# Patient Record
Sex: Male | Born: 1945 | Race: White | Hispanic: No | Marital: Single | Smoking: Current every day smoker
Health system: Southern US, Community
[De-identification: ages and names within clinical notes are randomized; demographics above are authoritative.]

## PROBLEM LIST (undated history)

## (undated) DIAGNOSIS — D649 Anemia, unspecified: Secondary | ICD-10-CM

## (undated) DIAGNOSIS — K219 Gastro-esophageal reflux disease without esophagitis: Secondary | ICD-10-CM

## (undated) DIAGNOSIS — I4891 Unspecified atrial fibrillation: Secondary | ICD-10-CM

## (undated) DIAGNOSIS — I499 Cardiac arrhythmia, unspecified: Secondary | ICD-10-CM

## (undated) DIAGNOSIS — N210 Calculus in bladder: Secondary | ICD-10-CM

## (undated) DIAGNOSIS — M1990 Unspecified osteoarthritis, unspecified site: Secondary | ICD-10-CM

## (undated) DIAGNOSIS — I10 Essential (primary) hypertension: Secondary | ICD-10-CM

## (undated) HISTORY — PX: HIP SURGERY: SHX245

## (undated) HISTORY — PX: CATARACT EXTRACTION: SUR2

---

## 2010-12-07 HISTORY — PX: JOINT REPLACEMENT: SHX530

## 2012-06-07 ENCOUNTER — Ambulatory Visit: Payer: Self-pay | Admitting: Ophthalmology

## 2012-06-07 LAB — PROTIME-INR
INR: 1.9
Prothrombin Time: 22 secs — ABNORMAL HIGH (ref 11.5–14.7)

## 2012-06-07 LAB — POTASSIUM: Potassium: 3.6 mmol/L (ref 3.5–5.1)

## 2012-06-21 ENCOUNTER — Ambulatory Visit: Payer: Self-pay | Admitting: Ophthalmology

## 2012-06-21 LAB — PROTIME-INR
INR: 1.9
Prothrombin Time: 22 secs — ABNORMAL HIGH (ref 11.5–14.7)

## 2012-06-21 LAB — POTASSIUM: Potassium: 3.3 mmol/L — ABNORMAL LOW (ref 3.5–5.1)

## 2012-08-10 ENCOUNTER — Ambulatory Visit: Payer: Self-pay | Admitting: Ophthalmology

## 2012-08-10 LAB — PROTIME-INR
INR: 3.6
Prothrombin Time: 36.2 secs — ABNORMAL HIGH (ref 11.5–14.7)

## 2012-08-10 LAB — POTASSIUM: Potassium: 3.4 mmol/L — ABNORMAL LOW (ref 3.5–5.1)

## 2012-08-23 ENCOUNTER — Ambulatory Visit: Payer: Self-pay | Admitting: Ophthalmology

## 2013-12-11 ENCOUNTER — Emergency Department: Payer: Self-pay | Admitting: Emergency Medicine

## 2014-06-07 ENCOUNTER — Emergency Department: Payer: Self-pay | Admitting: Emergency Medicine

## 2015-03-26 NOTE — Op Note (Signed)
PATIENT NAME:  Roger [name], Roger [name] MR#:  [id number] DATE OF BIRTH:  1945-12-09  DATE OF PROCEDURE:  06/21/2012  PREOPERATIVE DIAGNOSIS: Visually significant cataract of the left eye.   POSTOPERATIVE DIAGNOSIS: Visually significant cataract of the left eye.   OPERATIVE PROCEDURE: Cataract extraction by phacoemulsification with implant of intraocular lens to left eye.   SURGEON: Galen ManilaWilliam Khristi Schiller, MD.   ANESTHESIA:  1. Managed anesthesia care.  2. Topical tetracaine drops followed by 2% Xylocaine jelly applied in the preoperative holding area.   COMPLICATIONS: None.   TECHNIQUE:  Stop and chop.   DESCRIPTION OF PROCEDURE: The patient was examined and consented in the preoperative holding area where the aforementioned topical anesthesia was applied to the left eye and then brought back to the Operating Room where the left eye was prepped and draped in the usual sterile ophthalmic fashion and a lid speculum was placed. A paracentesis was created with the side port blade and the anterior chamber was filled with viscoelastic. A near clear corneal incision was performed with the steel keratome. A continuous curvilinear capsulorrhexis was performed with a cystotome followed by the capsulorrhexis forceps. Hydrodissection and hydrodelineation were carried out with BSS on a blunt cannula. The lens was removed in a stop and chop technique and the remaining cortical material was removed with the irrigation-aspiration handpiece. The capsular bag was inflated with viscoelastic and the Technis ZCB00 19.0-diopter lens, serial number [id number] was placed in the capsular bag without complication. The remaining viscoelastic was removed from the eye with the irrigation-aspiration handpiece. The wounds were hydrated. The anterior chamber was flushed with Miostat and the eye was inflated to physiologic pressure. The wounds were found to be water tight. The eye was dressed with Vigamox. The patient was given protective glasses  to wear throughout the day and a shield with which to sleep tonight. The patient was also given drops with which to begin a drop regimen today and will follow-up with me in one day.   ____________________________ Jerilee FieldWilliam L. Lillah Standre, MD wlp:cms D: 06/21/2012 12:32:28 ET T: 06/21/2012 13:00:43 ET JOB#: [id number]  cc: Halie Gass L. Ikram Riebe, MD, <Dictator> Jerilee FieldWILLIAM L Clarrisa Kaylor MD ELECTRONICALLY SIGNED 06/30/2012 13:18

## 2015-03-26 NOTE — Op Note (Signed)
PATIENT NAME:  Roger [name], Roger [name] MR#:  [id number] DATE OF BIRTH:  [birth year]  DATE OF PROCEDURE:  08/23/2012  PREOPERATIVE DIAGNOSIS: Visually significant cataract of the right eye.   POSTOPERATIVE DIAGNOSIS: Visually significant cataract of the right eye.   OPERATIVE PROCEDURE: Cataract extraction by phacoemulsification with implant of intraocular lens to right eye.   SURGEON: Galen ManilaWilliam Dasiah Hooley, MD.   ANESTHESIA:  1. Managed anesthesia care.  2. Topical tetracaine drops followed by 2% Xylocaine jelly applied in the preoperative holding area.   COMPLICATIONS: None.   TECHNIQUE:  Stop and chop.    DESCRIPTION OF PROCEDURE: The patient was examined and consented in the preoperative holding area where the aforementioned topical anesthesia was applied to the right eye and then brought back to the Operating Room where the right eye was prepped and draped in the usual sterile ophthalmic fashion and a lid speculum was placed. A paracentesis was created with the side port blade and the anterior chamber was filled with viscoelastic. A near clear corneal incision was performed with the steel keratome. A continuous curvilinear capsulorrhexis was performed with a cystotome followed by the capsulorrhexis forceps. Hydrodissection and hydrodelineation were carried out with BSS on a blunt cannula. The lens was removed in a stop and chop technique and the remaining cortical material was removed with the irrigation-aspiration handpiece. The capsular bag was inflated with viscoelastic and the Technis ZCB00 20.0- diopter lens, serial number [id number] was placed in the capsular bag without complication. The remaining viscoelastic was removed from the eye with the irrigation-aspiration handpiece. The wounds were hydrated. The anterior chamber was flushed with Miostat and the eye was inflated to physiologic pressure. The wounds were found to be water tight. The eye was dressed with Vigamox. The patient was given  protective glasses to wear throughout the day and a shield with which to sleep tonight. The patient was also given drops with which to begin a drop regimen today and will follow-up with me in one day.    ____________________________ Jerilee FieldWilliam L. Corbett Moulder, MD wlp:ap D: 08/23/2012 12:33:18 ET T: 08/23/2012 13:57:35 ET JOB#: [id number]  cc: Dixie Coppa L. Charma Mocarski, MD, <Dictator> Jerilee FieldWILLIAM L Alece Koppel MD ELECTRONICALLY SIGNED 09/26/2012 13:45

## 2015-09-13 ENCOUNTER — Encounter: Payer: Self-pay | Admitting: Emergency Medicine

## 2015-09-13 ENCOUNTER — Emergency Department: Payer: Medicare Other

## 2015-09-13 ENCOUNTER — Emergency Department
Admission: EM | Admit: 2015-09-13 | Discharge: 2015-09-13 | Disposition: A | Discharge status: Home / Self Care | Payer: Medicare Other | Attending: Emergency Medicine | Admitting: Emergency Medicine

## 2015-09-13 DIAGNOSIS — Z720 Tobacco use: Secondary | ICD-10-CM | POA: Insufficient documentation

## 2015-09-13 DIAGNOSIS — I10 Essential (primary) hypertension: Secondary | ICD-10-CM | POA: Diagnosis not present

## 2015-09-13 DIAGNOSIS — R600 Localized edema: Secondary | ICD-10-CM | POA: Insufficient documentation

## 2015-09-13 DIAGNOSIS — Z880 Allergy status to penicillin: Secondary | ICD-10-CM | POA: Insufficient documentation

## 2015-09-13 DIAGNOSIS — G8929 Other chronic pain: Secondary | ICD-10-CM | POA: Insufficient documentation

## 2015-09-13 DIAGNOSIS — M25572 Pain in left ankle and joints of left foot: Secondary | ICD-10-CM | POA: Diagnosis not present

## 2015-09-13 DIAGNOSIS — M25473 Effusion, unspecified ankle: Secondary | ICD-10-CM

## 2015-09-13 DIAGNOSIS — M25571 Pain in right ankle and joints of right foot: Secondary | ICD-10-CM

## 2015-09-13 HISTORY — DX: Unspecified atrial fibrillation: I48.91

## 2015-09-13 HISTORY — DX: Essential (primary) hypertension: I10

## 2015-09-13 NOTE — ED Notes (Signed)
Patient transported to X-ray 

## 2015-09-13 NOTE — Discharge Instructions (Signed)
Edema °Edema is an abnormal buildup of fluids in your body tissues. Edema is somewhat dependent on gravity to pull the fluid to the lowest place in your body. That makes the condition more common in the legs and thighs (lower extremities). Painless swelling of the feet and ankles is common and becomes more likely as you get older. It is also common in looser tissues, like around your eyes.  °When the affected area is squeezed, the fluid may move out of that spot and leave a dent for a few moments. This dent is called pitting.  °CAUSES  °There are many possible causes of edema. Eating too much salt and being on your feet or sitting for a long time can cause edema in your legs and ankles. Hot weather may make edema worse. Common medical causes of edema include: °· Heart failure. °· Liver disease. °· Kidney disease. °· Weak blood vessels in your legs. °· Cancer. °· An injury. °· Pregnancy. °· Some medications. °· Obesity.  °SYMPTOMS  °Edema is usually painless. Your skin may look swollen or shiny.  °DIAGNOSIS  °Your health care provider may be able to diagnose edema by asking about your medical history and doing a physical exam. You may need to have tests such as X-rays, an electrocardiogram, or blood tests to check for medical conditions that may cause edema.  °TREATMENT  °Edema treatment depends on the cause. If you have heart, liver, or kidney disease, you need the treatment appropriate for these conditions. General treatment may include: °· Elevation of the affected body part above the level of your heart. °· Compression of the affected body part. Pressure from elastic bandages or support stockings squeezes the tissues and forces fluid back into the blood vessels. This keeps fluid from entering the tissues. °· Restriction of fluid and salt intake. °· Use of a water pill (diuretic). These medications are appropriate only for some types of edema. They pull fluid out of your body and make you urinate more often. This  gets rid of fluid and reduces swelling, but diuretics can have side effects. Only use diuretics as directed by your health care provider. °HOME CARE INSTRUCTIONS  °· Keep the affected body part above the level of your heart when you are lying down.   °· Do not sit still or stand for prolonged periods.   °· Do not put anything directly under your knees when lying down. °· Do not wear constricting clothing or garters on your upper legs.   °· Exercise your legs to work the fluid back into your blood vessels. This may help the swelling go down.   °· Wear elastic bandages or support stockings to reduce ankle swelling as directed by your health care provider.   °· Eat a low-salt diet to reduce fluid if your health care provider recommends it.   °· Only take medicines as directed by your health care provider.  °SEEK MEDICAL CARE IF:  °· Your edema is not responding to treatment. °· You have heart, liver, or kidney disease and notice symptoms of edema. °· You have edema in your legs that does not improve after elevating them.   °· You have sudden and unexplained weight gain. °SEEK IMMEDIATE MEDICAL CARE IF:  °· You develop shortness of breath or chest pain.   °· You cannot breathe when you lie down. °· You develop pain, redness, or warmth in the swollen areas.   °· You have heart, liver, or kidney disease and suddenly get edema. °· You have a fever and your symptoms suddenly get worse. °MAKE SURE YOU:  °·   Understand these instructions. °· Will watch your condition. °· Will get help right away if you are not doing well or get worse. °  °This information is not intended to replace advice given to you by your health care provider. Make sure you discuss any questions you have with your health care provider. °  °Document Released: 11/23/2005 Document Revised: 12/14/2014 Document Reviewed: 09/15/2013 °Elsevier Interactive Patient Education ©2016 [employer] ° °Joint Pain °Joint pain, which is also called arthralgia, can be  caused by many things. Joint pain often goes away when you follow your health care provider's instructions for relieving pain at home. However, joint pain can also be caused by conditions that require further treatment. Common causes of joint pain include: °· Bruising in the area of the joint. °· Overuse of the joint. °· Wear and tear on the joints that occur with aging (osteoarthritis). °· Various other forms of arthritis. °· A buildup of a crystal form of uric acid in the joint (gout). °· Infections of the joint (septic arthritis) or of the bone (osteomyelitis). °Your health care provider may recommend medicine to help with the pain. If your joint pain continues, additional tests may be needed to diagnose your condition. °HOME CARE INSTRUCTIONS °Watch your condition for any changes. Follow these instructions as directed to lessen the pain that you are feeling. °· Take medicines only as directed by your health care provider. °· Rest the affected area for as long as your health care provider says that you should. If directed to do so, raise the painful joint above the level of your heart while you are sitting or lying down. °· Do not do things that cause or worsen pain. °· If directed, apply ice to the painful area: °¨ Put ice in a plastic bag. °¨ Place a towel between your skin and the bag. °¨ Leave the ice on for 20 minutes, 2-3 times per day. °· Wear an elastic bandage, splint, or sling as directed by your health care provider. Loosen the elastic bandage or splint if your fingers or toes become numb and tingle, or if they turn cold and blue. °· Begin exercising or stretching the affected area as directed by your health care provider. Ask your health care provider what types of exercise are safe for you. °· Keep all follow-up visits as directed by your health care provider. This is important. °SEEK MEDICAL CARE IF: °· Your pain increases, and medicine does not help. °· Your joint pain does not improve within 3  days. °· You have increased bruising or swelling. °· You have a fever. °· You lose 10 lb (4.5 kg) or more without trying. °SEEK IMMEDIATE MEDICAL CARE IF: °· You are not able to move the joint. °· Your fingers or toes become numb or they turn cold and blue. °  °This information is not intended to replace advice given to you by your health care provider. Make sure you discuss any questions you have with your health care provider. °  °Document Released: 11/23/2005 Document Revised: 12/14/2014 Document Reviewed: 09/04/2014 °Elsevier Interactive Patient Education ©2016 [employer] ° °

## 2015-09-13 NOTE — ED Notes (Signed)
Pt to ed with c/o left ankle pain that started in the middle of the night 2 nights ago.  Pt denies known injury.

## 2015-09-13 NOTE — ED Provider Notes (Signed)
[facility] Emergency Department Provider Note  ____________________________________________  Time seen: Approximately 7:50 AM  I have reviewed the triage vital signs and the nursing notes.   HISTORY  Chief Complaint Ankle Pain    HPI Roger [name] is a 69 y.o. male presents with complaints of left ankle pain 2 days. States thathe woke up about 3:00 in the morning with the pain. Has not been able to get the pain to go away.   Past Medical History  Diagnosis Date  . Atrial fibrillation (HCC)   . Hypertension     There are no active problems to display for this patient.   History reviewed. No pertinent past surgical history.  No current outpatient prescriptions on file.  Allergies Amoxicillin and Sulfa antibiotics  History reviewed. No pertinent family history.  Social History Social History  Substance Use Topics  . Smoking status: Current Every Day Smoker  . Smokeless tobacco: None  . Alcohol Use: Yes    Review of Systems Constitutional: No fever/chills Eyes: No visual changes. ENT: No sore throat. Cardiovascular: Denies chest pain. Respiratory: Denies shortness of breath. Gastrointestinal: No abdominal pain.  No nausea, no vomiting.  No diarrhea.  No constipation. Genitourinary: Negative for dysuria. Musculoskeletal: Positive for left ankle pain. Skin: Negative for rash. Neurological: Negative for headaches, focal weakness or numbness.  10-point ROS otherwise negative.  ____________________________________________   PHYSICAL EXAM:  VITAL SIGNS: ED Triage Vitals  Enc Vitals Group     BP 09/13/15 0745 127/82 mmHg     Pulse Rate 09/13/15 0745 97     Resp 09/13/15 0745 20     Temp 09/13/15 0745 98.1 F (36.7 C)     Temp Source 09/13/15 0745 Oral     SpO2 09/13/15 0745 100 %     Weight 09/13/15 0745 230 lb (104.327 kg)     Height 09/13/15 0745  (1.727 m)     Head Cir --      Peak Flow --      Pain Score 09/13/15  0746 1     Pain Loc --      Pain Edu? --      Excl. in GC? --     Constitutional: Alert and oriented. Well appearing and in no acute distress. Cardiovascular: Normal rate, regular rhythm. Grossly normal heart sounds.  Good peripheral circulation. Respiratory: Normal respiratory effort.  No retractions. Lungs CTAB. Musculoskeletal: Left ankle tenderness especially around the medial malleolus. Minimal edema. Neurologic:  Normal speech and language. No gross focal neurologic deficits are appreciated. No gait instability. Skin:  Skin is warm, dry and intact. No rash noted. Psychiatric: Mood and affect are normal. Speech and behavior are normal.  ____________________________________________   LABS (all labs ordered are listed, but only abnormal results are displayed)  Labs Reviewed - No data to display   RADIOLOGY  Not ankle films interpreted by the radiologist and reviewed by myself negative for acute fracture or dislocation or sprain. Nonspecific fluid noted around the ankle mortise itself. Positive for degenerative changes ____________________________________________   PROCEDURES  Procedure(s) performed: None  Critical Care performed: No  ____________________________________________   INITIAL IMPRESSION / ASSESSMENT AND PLAN / ED COURSE  Pertinent labs & imaging results that were available during my care of the patient were reviewed by me and considered in my medical decision making (see chart for details).  Velcro ankle stirrup brace provided. Suggested over-the-counter Tylenol for pain since patient is currently on Coumadin. Wear ankle splint and  Ace wrap as needed for comfort and follow back up with PCP as as needed. ____________________________________________   FINAL CLINICAL IMPRESSION(S) / ED DIAGNOSES  Final diagnoses:  Ankle edema  Ankle pain, chronic, right      Evangeline Dakin, PA-C 09/13/15 0840  Sharyn Creamer, MD 09/13/15 [phone]

## 2015-09-13 NOTE — ED Notes (Signed)
Pt states his ankle pain started about 3am yesterday and pain is worse in certain positions he is sitting in. Denies any injury. There is swelling noted to left ankle. Pt reports pain goes away after he takes a few steps.

## 2016-06-19 ENCOUNTER — Emergency Department: Payer: Medicare Other

## 2016-06-19 ENCOUNTER — Emergency Department
Admission: EM | Admit: 2016-06-19 | Discharge: 2016-06-19 | Disposition: A | Discharge status: Home / Self Care | Payer: Medicare Other | Attending: Emergency Medicine | Admitting: Emergency Medicine

## 2016-06-19 DIAGNOSIS — R531 Weakness: Secondary | ICD-10-CM | POA: Diagnosis not present

## 2016-06-19 DIAGNOSIS — F17200 Nicotine dependence, unspecified, uncomplicated: Secondary | ICD-10-CM | POA: Diagnosis not present

## 2016-06-19 DIAGNOSIS — I10 Essential (primary) hypertension: Secondary | ICD-10-CM | POA: Insufficient documentation

## 2016-06-19 DIAGNOSIS — I4891 Unspecified atrial fibrillation: Secondary | ICD-10-CM | POA: Insufficient documentation

## 2016-06-19 DIAGNOSIS — Z0000 Encounter for general adult medical examination without abnormal findings: Secondary | ICD-10-CM | POA: Diagnosis present

## 2016-06-19 DIAGNOSIS — Z5181 Encounter for therapeutic drug level monitoring: Secondary | ICD-10-CM | POA: Insufficient documentation

## 2016-06-19 LAB — CBC
HCT: 40.6 % (ref 40.0–52.0)
Hemoglobin: 14.1 g/dL (ref 13.0–18.0)
MCH: 31.8 pg (ref 26.0–34.0)
MCHC: 34.8 g/dL (ref 32.0–36.0)
MCV: 91.4 fL (ref 80.0–100.0)
Platelets: 258 10*3/uL (ref 150–440)
RBC: 4.44 MIL/uL (ref 4.40–5.90)
RDW: 15.8 % — ABNORMAL HIGH (ref 11.5–14.5)
WBC: 9.5 10*3/uL (ref 3.8–10.6)

## 2016-06-19 LAB — BASIC METABOLIC PANEL
Anion gap: 9 (ref 5–15)
BUN: 19 mg/dL (ref 6–20)
CO2: 27 mmol/L (ref 22–32)
Calcium: 8.6 mg/dL — ABNORMAL LOW (ref 8.9–10.3)
Chloride: 100 mmol/L — ABNORMAL LOW (ref 101–111)
Creatinine, Ser: 0.93 mg/dL (ref 0.61–1.24)
GFR calc Af Amer: >60 mL/min (ref >60)
GFR calc non Af Amer: >60 mL/min (ref >60)
Glucose, Bld: 125 mg/dL — ABNORMAL HIGH (ref 65–99)
Potassium: 3.3 mmol/L — ABNORMAL LOW (ref 3.5–5.1)
Sodium: 136 mmol/L (ref 135–145)

## 2016-06-19 LAB — URINALYSIS COMPLETE WITH MICROSCOPIC (ARMC ONLY)
Bilirubin Urine: NEGATIVE
Glucose, UA: NEGATIVE mg/dL
Hgb urine dipstick: NEGATIVE
Ketones, ur: NEGATIVE mg/dL
Leukocytes, UA: NEGATIVE
Nitrite: NEGATIVE
Protein, ur: NEGATIVE mg/dL
Specific Gravity, Urine: 1.017 (ref 1.005–1.030)
Squamous Epithelial / LPF: NONE SEEN
pH: 6 (ref 5.0–8.0)

## 2016-06-19 LAB — PROTIME-INR
INR: 2.75
Prothrombin Time: 28.7 seconds — ABNORMAL HIGH (ref 11.4–15.0)

## 2016-06-19 LAB — TROPONIN I: Troponin I: <0.03 ng/mL (ref <0.03)

## 2016-06-19 NOTE — Discharge Instructions (Signed)

## 2016-06-19 NOTE — ED Provider Notes (Signed)
[facility] Emergency Department Provider Note        Time seen: ----------------------------------------- 7:02 AM on 06/19/2016 -----------------------------------------    I have reviewed the triage vital signs and the nursing notes.   HISTORY  Chief Complaint Chills    HPI Roger [name] is a 70 y.o. male who presents to ER for episodes of this sweats for the last 2 weeks. Patient reports his skin feels cold, he feels shaky.Patient reports these symptoms are intermittent. He denies any currently. Reports he does have a history of atrial fibrillation. He denies fevers, chest pain, shortness of breath, nausea vomiting or diarrhea. Patient has recently been placed on some eyedrops but denies any other changes in his medicines.   Past Medical History  Diagnosis Date  . Atrial fibrillation (HCC)   . Hypertension     There are no active problems to display for this patient.   No past surgical history on file.  Allergies Amoxicillin and Sulfa antibiotics  Social History Social History  Substance Use Topics  . Smoking status: Current Every Day Smoker  . Smokeless tobacco: Not on file  . Alcohol Use: Yes    Review of Systems Constitutional: Negative for fever. Cardiovascular: Negative for chest pain. Respiratory: Negative for shortness of breath. Gastrointestinal: Negative for abdominal pain, vomiting and diarrhea. Genitourinary: Negative for dysuria. Musculoskeletal: Negative for back pain. Skin: Positive for sweats Neurological: Negative for headaches, focal weakness or numbness. Positive for tremulousness  10-point ROS otherwise negative.  ____________________________________________   PHYSICAL EXAM:  VITAL SIGNS: ED Triage Vitals  Enc Vitals Group     BP 06/19/16 0633 141/90 mmHg     Pulse Rate 06/19/16 0633 106     Resp 06/19/16 0633 20     Temp 06/19/16 0633 98.1 F (36.7 C)     Temp Source 06/19/16 0633 Oral     SpO2  06/19/16 0633 97 %     Weight 06/19/16 0633 225 lb (102.059 kg)     Height 06/19/16 0633  (1.727 m)     Head Cir --      Peak Flow --      Pain Score --      Pain Loc --      Pain Edu? --      Excl. in GC? --     Constitutional: Alert and oriented. Well appearing and in no distress. Eyes: Conjunctivae are normal. PERRL. Normal extraocular movements. ENT   Head: Normocephalic and atraumatic.   Nose: No congestion/rhinnorhea.   Mouth/Throat: Mucous membranes are moist.   Neck: No stridor. Cardiovascular: Irregularly irregular rhythm. No murmurs, rubs, or gallops. Respiratory: Normal respiratory effort without tachypnea nor retractions. Breath sounds are clear and equal bilaterally. No wheezes/rales/rhonchi. Gastrointestinal: Soft and nontender. Normal bowel sounds Musculoskeletal: Nontender with normal range of motion in all extremities. No lower extremity tenderness nor edema. Neurologic:  Normal speech and language. No gross focal neurologic deficits are appreciated.  Skin:  Skin is warm, dry and intact. No rash noted. Psychiatric: Mood and affect are normal. Speech and behavior are normal.  ____________________________________________  EKG: Interpreted by me. Atrial fibrillation with a rate of 100 bpm, normal QRS, normal QT interval. Normal axis, low voltage  ____________________________________________  ED COURSE:  Pertinent labs & imaging results that were available during my care of the patient were reviewed by me and considered in my medical decision making (see chart for details). Patient presents to the ER for generalized sweats and chills. We  will check basic labs and imaging. ____________________________________________    LABS (pertinent positives/negatives)  Labs Reviewed  BASIC METABOLIC PANEL - Abnormal; Notable for the following:    Potassium 3.3 (*)    Chloride 100 (*)    Glucose, Bld 125 (*)    Calcium 8.6 (*)    All other components  within normal limits  CBC - Abnormal; Notable for the following:    RDW 15.8 (*)    All other components within normal limits  PROTIME-INR - Abnormal; Notable for the following:    Prothrombin Time 28.7 (*)    All other components within normal limits  URINALYSIS COMPLETEWITH MICROSCOPIC ([facility] ONLY) - Abnormal; Notable for the following:    Color, Urine YELLOW (*)    APPearance CLEAR (*)    Bacteria, UA RARE (*)    All other components within normal limits  TROPONIN I    RADIOLOGY  Chest x-ray IMPRESSION: No active cardiopulmonary disease.  Aortic atherosclerosis. ____________________________________________  FINAL ASSESSMENT AND PLAN  Medical screening exam, sweats  Plan: Patient with labs and imaging as dictated above. No clear etiology for his symptoms. I will advise increasing exercise, close outpatient follow-up with his doctor. Labs and workup appears stable.   Emily FilbertWilliams, Jonathan E, MD   Note: This dictation was prepared with Dragon dictation. Any transcriptional errors that result from this process are unintentional   Emily FilbertJonathan E Williams, MD 06/19/16 [phone]

## 2016-06-19 NOTE — ED Notes (Signed)
Pt states has had episodes of sweatiness for 2 weeks, "skin feels cold", and shakiness.

## 2016-09-17 ENCOUNTER — Emergency Department
Admission: EM | Admit: 2016-09-17 | Discharge: 2016-09-17 | Disposition: A | Discharge status: Home / Self Care | Payer: Medicare Other | Attending: Emergency Medicine | Admitting: Emergency Medicine

## 2016-09-17 ENCOUNTER — Encounter: Payer: Self-pay | Admitting: Emergency Medicine

## 2016-09-17 DIAGNOSIS — Z711 Person with feared health complaint in whom no diagnosis is made: Secondary | ICD-10-CM | POA: Insufficient documentation

## 2016-09-17 DIAGNOSIS — M79602 Pain in left arm: Secondary | ICD-10-CM | POA: Diagnosis present

## 2016-09-17 DIAGNOSIS — I10 Essential (primary) hypertension: Secondary | ICD-10-CM | POA: Insufficient documentation

## 2016-09-17 DIAGNOSIS — F17200 Nicotine dependence, unspecified, uncomplicated: Secondary | ICD-10-CM | POA: Diagnosis not present

## 2016-09-17 NOTE — ED Provider Notes (Signed)
[facility] Emergency Department Provider Note   ____________________________________________   First MD Initiated Contact with Patient 09/17/16 [phone]     (approximate)  I have reviewed the triage vital signs and the nursing notes.   HISTORY  Chief Complaint Arm Pain    HPI Roger [name] is a 70 y.o. male is here with concerns of his left arm. Patient states that he is on blood thinners and had blood [facility] yesterday. He states that there was a bruise when they drew his blood. He believes that this bruise is larger than it was last time which is causing great concern for him. He denies any pain, fever, chills. Patient has full range of motion of his arm without any difficulty. Patient currently is taking Coumadin for atrial fib. His [facility] care [facility] at [facility].   Past Medical History:  Diagnosis Date  . Atrial fibrillation (HCC)   . Hypertension     There are no active problems to display for this patient.   No past surgical history on file.  Prior to Admission medications   Not on File    Allergies Amoxicillin and Sulfa antibiotics  No family history on file.  Social History Social History  Substance Use Topics  . Smoking status: Current Every Day Smoker  . Smokeless tobacco: Not on file  . Alcohol use Yes    Review of Systems Constitutional: No fever/chills Cardiovascular: Denies chest pain. Respiratory: Denies shortness of breath. Gastrointestinal:   No nausea, no vomiting.   Musculoskeletal: Negative for left arm pain. Skin: Positive for bruise left arm. Neurological: Negative for headaches, focal weakness or numbness.  10-point ROS otherwise negative.  ____________________________________________   PHYSICAL EXAM:  VITAL SIGNS: ED Triage Vitals  Enc Vitals Group     BP 09/17/16 0801 112/63     Pulse Rate 09/17/16 0758 91     Resp 09/17/16 0758 20     Temp 09/17/16 0758 99.3 F (37.4  C)     Temp Source 09/17/16 0758 Oral     SpO2 09/17/16 0758 96 %     Weight 09/17/16 0759 225 lb (102.1 kg)     Height 09/17/16 0759 5\' 8"  (1.727 m)     Head Circumference --      Peak Flow --      Pain Score --      Pain Loc --      Pain Edu? --      Excl. in GC? --     Constitutional: Alert and oriented. Well appearing and in no acute distress. Eyes: Conjunctivae are normal. PERRL. EOMI. Head: Atraumatic. Nose: No congestion/rhinnorhea. Neck: No stridor.   Cardiovascular: Regular rate, irregular rhythm. (Known atrial fibrillation) Grossly normal heart sounds.  Good peripheral circulation. Respiratory: Normal respiratory effort.  No retractions. Lungs CTAB. Musculoskeletal: Moves upper and lower extremities without any difficulty. Normal gait was noted. Neurologic:  Normal speech and language. No gross focal neurologic deficits are appreciated. No gait instability. Skin:  Skin is warm, dry and intact. Left antecubital fossa space there is a 1 cm ecchymotic area without soft tissue swelling, active bleeding, no warmth or tenderness. Psychiatric: Mood and affect are normal. Speech and behavior are normal.  ____________________________________________   LABS (all labs ordered are listed, but only abnormal results are displayed)  Labs Reviewed - No data to display   PROCEDURES  Procedure(s) performed: None  Procedures  Critical Care performed: No  ____________________________________________  INITIAL IMPRESSION / ASSESSMENT AND PLAN / ED COURSE  Pertinent labs & imaging results that were available during my care of the patient were reviewed by me and considered in my medical decision making (see chart for details).    Clinical Course   Patient was reassured that this is common with having blood drawn and also being on blood thinners. Patient will follow up with his primary care doctor at the [facility] if any other problems or continued  concerns.  ____________________________________________   FINAL CLINICAL IMPRESSION(S) / ED DIAGNOSES  Final diagnoses:  Person with feared complaint, no diagnosis made      NEW MEDICATIONS STARTED DURING THIS VISIT:  There are no discharge medications for this patient.    Note:  This document was prepared using Dragon voice recognition software and may include unintentional dictation errors.    Tommi Rumps, PA-C 09/17/16 4540    Sharman Cheek, MD 09/17/16 1515

## 2016-09-17 NOTE — ED Triage Notes (Signed)
Pt reports is no a blood thinner and reports that the area on his arm is not any bigger than it was yesterday but he needs it checked out.

## 2016-09-17 NOTE — ED Triage Notes (Signed)
Pt reports had blood drawn yesterday and now has a bruise where they drew it. Pt denies pain and full ROM.

## 2016-11-25 ENCOUNTER — Emergency Department: Payer: Medicare Other

## 2016-11-25 ENCOUNTER — Encounter: Payer: Self-pay | Admitting: *Deleted

## 2016-11-25 ENCOUNTER — Emergency Department
Admission: EM | Admit: 2016-11-25 | Discharge: 2016-11-25 | Disposition: A | Discharge status: Home / Self Care | Payer: Medicare Other | Attending: Emergency Medicine | Admitting: Emergency Medicine

## 2016-11-25 DIAGNOSIS — F17210 Nicotine dependence, cigarettes, uncomplicated: Secondary | ICD-10-CM | POA: Diagnosis not present

## 2016-11-25 DIAGNOSIS — J069 Acute upper respiratory infection, unspecified: Secondary | ICD-10-CM | POA: Diagnosis not present

## 2016-11-25 DIAGNOSIS — H1089 Other conjunctivitis: Secondary | ICD-10-CM | POA: Insufficient documentation

## 2016-11-25 DIAGNOSIS — H1032 Unspecified acute conjunctivitis, left eye: Secondary | ICD-10-CM

## 2016-11-25 DIAGNOSIS — I10 Essential (primary) hypertension: Secondary | ICD-10-CM | POA: Insufficient documentation

## 2016-11-25 DIAGNOSIS — J0100 Acute maxillary sinusitis, unspecified: Secondary | ICD-10-CM

## 2016-11-25 DIAGNOSIS — R0981 Nasal congestion: Secondary | ICD-10-CM | POA: Diagnosis present

## 2016-11-25 MED ORDER — OFLOXACIN 0.3 % OP SOLN: 1.0000 [drp] | Freq: Four times a day (QID) | OPHTHALMIC | 0 refills | Status: DC

## 2016-11-25 MED ORDER — LEVOFLOXACIN 500 MG PO TABS: 500.0000 mg | ORAL_TABLET | Freq: Every day | ORAL | 0 refills | Status: AC

## 2016-11-25 MED ORDER — PROMETHAZINE-DM 6.25-15 MG/5ML PO SYRP: 5.0000 mL | ORAL_SOLUTION | Freq: Four times a day (QID) | ORAL | 0 refills | Status: DC | PRN

## 2016-11-25 NOTE — ED Triage Notes (Signed)
Pt complains of sinus pressure, congestion and cough, pt was seen at [facility] two weeks ago, pt denies any other symptoms

## 2016-11-25 NOTE — ED Provider Notes (Signed)
[facility] Emergency Department Provider Note  ____________________________________________  Time seen: Approximately 10:10 AM  I have reviewed the triage vital signs and the nursing notes.   HISTORY  Chief Complaint Facial Pain    HPI Roger PesaRobert E [name] is a 70 y.o. male , NAD, presents to the emergency department with 3 week history of cough, chest congestion and sinus pressure. Patient states he was seen in urgent care approximately 2 weeks ago for same symptoms. Was placed on doxycycline, Tessalon Perles and given Flonase. States he initially began to feel better but since he has finished his medications his symptoms have worsened. His had no fevers, chills or body aches but has felt fatigued. Notes that he has cycles of severe coughs at times. States it is seems that the coughing is worse at night when he lays on his left side. Denies any wheezing, shortness of breath or nocturnal dyspnea. Has had no swelling in his extremities. Denies any chest pain or palpitations. Has had sinus pressure and pain that has caused some drainage from the left ear. Also notes ear pressure and pain but no discharge or drainage from the ears. Has had significant nasal congestion with runny nose but no epistaxis.   Past Medical History:  Diagnosis Date  . Atrial fibrillation (HCC)   . Hypertension     There are no active problems to display for this patient.   History reviewed. No pertinent surgical history.  Prior to Admission medications   Medication Sig Start Date End Date Taking? Authorizing Provider  levofloxacin (LEVAQUIN) 500 MG tablet Take 1 tablet (500 mg total) by mouth daily. 11/25/16 12/02/16  Davinia Riccardi L Coburn Knaus, PA-C  ofloxacin (OCUFLOX) 0.3 % ophthalmic solution Place 1 drop into the left eye 4 (four) times daily. 11/25/16   Zeola Brys L Keiondre Colee, PA-C  promethazine-dextromethorphan (PROMETHAZINE-DM) 6.25-15 MG/5ML syrup Take 5 mLs by mouth 4 (four) times daily as needed for  cough. 11/25/16   Dasie Chancellor L Ambyr Qadri, PA-C    Allergies Amoxicillin and Sulfa antibiotics  No family history on file.  Social History Social History  Substance Use Topics  . Smoking status: Current Every Day Smoker    Packs/day: 0.50    Types: Cigarettes  . Smokeless tobacco: Not on file  . Alcohol use Yes     Review of Systems  Constitutional: Positive fatigue. No fever/chills Eyes: Positive discharge from left eye. No visual changes.  ENT: Positive nasal congestion, sinus pressure, runny nose, ear pressure. No sore throat. Cardiovascular: No chest pain. Respiratory: Positive cough, chest congestion. No shortness of breath. No wheezing.  Gastrointestinal: No abdominal pain.  No nausea, vomiting.  Musculoskeletal: Negative for back pain, general myalgias.  Skin: Negative for rash. Neurological: Negative for headaches, focal weakness or numbness. 10-point ROS otherwise negative.  ____________________________________________   PHYSICAL EXAM:  VITAL SIGNS: ED Triage Vitals  Enc Vitals Group     BP 11/25/16 0954 121/71     Pulse Rate 11/25/16 0954 (!) 56     Resp 11/25/16 0954 20     Temp 11/25/16 0954 (S) 97.6 F (36.4 C)     Temp Source 11/25/16 0954 Oral     SpO2 11/25/16 0954 97 %     Weight 11/25/16 0955 225 lb (102.1 kg)     Height 11/25/16 0955 5\' 8"  (1.727 m)     Head Circumference --      Peak Flow --      Pain Score --      Pain  Loc --      Pain Edu? --      Excl. in GC? --      Constitutional: Alert and oriented. Well appearing and in no acute distress. Eyes: Left conjunctiva with trace injection. Green, mucoid discharge from left eye. Left upper and lower eyelids with mild edema and irritation. Right conjunctiva and eyelids without abnormality. PERRL. EOMI without pain.  Head: Atraumatic. ENT:      Ears: Bilateral TMs visualized with mild effusion but no erythema, bulging, perforation. Bilateral canals without erythema, swelling, discharge      Nose:  Moderate congestion with trace purulent rhinorrhea.      Mouth/Throat: Mucous membranes are moist. Pharynx without erythema, swelling, exudate. Uvula is midline. Airway is patent. Clear postnasal drainage.  Neck: Supple with FROM.  Hematological/Lymphatic/Immunilogical: No cervical lymphadenopathy. Cardiovascular: Normal rate, regular rhythm. Normal S1 and S2.  Good peripheral circulation. Respiratory: Normal respiratory effort without tachypnea or retractions. Lungs CTAB with breath sounds noted in all lung fields. No wheeze, rhonchi or rales. Neurologic:  Normal speech and language. No gross focal neurologic deficits are appreciated.  Skin:  Skin is warm, dry and intact. No rash noted. Psychiatric: Mood and affect are normal. Speech and behavior are normal. Patient exhibits appropriate insight and judgement.   ____________________________________________   LABS  None ____________________________________________  EKG  None ____________________________________________  RADIOLOGY I, Hope Pigeon, personally viewed and evaluated these images (plain radiographs) as part of my medical decision making, as well as reviewing the written report by the radiologist.  Dg Chest 2 View  Result Date: 11/25/2016 CLINICAL DATA:  Cough for 3 weeks EXAM: CHEST  2 VIEW COMPARISON:  06/19/2016 FINDINGS: Normal heart size and mediastinal contours. Apparent transverse narrowing of the trachea in the AP projection, not seen laterally. There is no edema, consolidation, effusion, or pneumothorax. Aortic arch atherosclerosis. IMPRESSION: 1. No evidence of acute disease. 2. Apparent narrowing transverse narrowing of the trachea. Noncontrast CT could further evaluate, especially if there is history of stridor or prior intubation. Electronically Signed   By: Marnee Spring M.D.   On: 11/25/2016 10:51    ____________________________________________    PROCEDURES  Procedure(s) performed:  None   Procedures   Medications - No data to display   ____________________________________________   INITIAL IMPRESSION / ASSESSMENT AND PLAN / ED COURSE  Pertinent labs & imaging results that were available during my care of the patient were reviewed by me and considered in my medical decision making (see chart for details).  Clinical Course     Patient's diagnosis is consistent with Acute recurrent maxillary sinusitis, acute bacterial conjunctivitis of left eye and URI. Patient will be discharged home with prescriptions for Levaquin, ofloxacin and promethazine DM to take as directed. Patient may continue Flonase as needed. Patient is to follow up with [facility] if symptoms persist past this treatment course. Patient is given ED precautions to return to the ED for any worsening or new symptoms.   ____________________________________________  FINAL CLINICAL IMPRESSION(S) / ED DIAGNOSES  Final diagnoses:  Acute non-recurrent maxillary sinusitis  Upper respiratory tract infection, unspecified type  Acute bacterial conjunctivitis of left eye      NEW MEDICATIONS STARTED DURING THIS VISIT:  Discharge Medication List as of 11/25/2016 11:10 AM    START taking these medications   Details  levofloxacin (LEVAQUIN) 500 MG tablet Take 1 tablet (500 mg total) by mouth daily., Starting Wed 11/25/2016, Until Wed 12/02/2016, Print    ofloxacin (OCUFLOX) 0.3 %  ophthalmic solution Place 1 drop into the left eye 4 (four) times daily., Starting Wed 11/25/2016, Print    promethazine-dextromethorphan (PROMETHAZINE-DM) 6.25-15 MG/5ML syrup Take 5 mLs by mouth 4 (four) times daily as needed for cough., Starting Wed 11/25/2016, Print             Ernestene KielJami L BardwellHagler, PA-C 11/25/16 1203    Myrna Blazeravid Matthew Schaevitz, MD 11/25/16 [phone]

## 2017-03-16 ENCOUNTER — Emergency Department
Admission: EM | Admit: 2017-03-16 | Discharge: 2017-03-16 | Disposition: A | Discharge status: Home / Self Care | Payer: Medicare Other | Attending: Emergency Medicine | Admitting: Emergency Medicine

## 2017-03-16 DIAGNOSIS — H6993 Unspecified Eustachian tube disorder, bilateral: Secondary | ICD-10-CM | POA: Diagnosis not present

## 2017-03-16 DIAGNOSIS — Z79899 Other long term (current) drug therapy: Secondary | ICD-10-CM | POA: Insufficient documentation

## 2017-03-16 DIAGNOSIS — I10 Essential (primary) hypertension: Secondary | ICD-10-CM | POA: Insufficient documentation

## 2017-03-16 DIAGNOSIS — F17210 Nicotine dependence, cigarettes, uncomplicated: Secondary | ICD-10-CM | POA: Insufficient documentation

## 2017-03-16 DIAGNOSIS — H9192 Unspecified hearing loss, left ear: Secondary | ICD-10-CM | POA: Diagnosis present

## 2017-03-16 DIAGNOSIS — H6983 Other specified disorders of Eustachian tube, bilateral: Secondary | ICD-10-CM

## 2017-03-16 MED ORDER — LORATADINE-PSEUDOEPHEDRINE ER 5-120 MG PO TB12: 1.0000 | ORAL_TABLET | Freq: Two times a day (BID) | ORAL | 2 refills | Status: AC

## 2017-03-16 MED ORDER — CARBAMIDE PEROXIDE 6.5 % OT SOLN: 5.0000 [drp] | Freq: Two times a day (BID) | OTIC | 0 refills | Status: DC

## 2017-03-16 NOTE — ED Triage Notes (Signed)
Pt c/o fullness with loss of hearing in the left ear and some in the right ear .Marland Kitchen States he thinks it is wax stuck in there.

## 2017-03-16 NOTE — ED Provider Notes (Signed)
[facility] Emergency Department Provider Note   ____________________________________________   First MD Initiated Contact with Patient 03/16/17 1450     (approximate)  I have reviewed the triage vital signs and the nursing notes.   HISTORY  Chief Complaint Ear Fullness    HPI Roger [name] is a 71 y.o. male patient complaining of hearing loss to the left ear and believes he has wax in both ears. Patient also states he is having allergies signs and symptoms last week. Denies fever or chills associated this complaint denies vertigo or vision disturbance. No palliative measures taken for this complaint. Patient denies pain.   Past Medical History:  Diagnosis Date  . Atrial fibrillation (HCC)   . Hypertension     There are no active problems to display for this patient.   History reviewed. No pertinent surgical history.  Prior to Admission medications   Medication Sig Start Date End Date Taking? Authorizing Provider  carbamide peroxide (DEBROX) 6.5 % otic solution Place 5 drops into the right ear 2 (two) times daily. 03/16/17   Joni Reining, PA-C  loratadine-pseudoephedrine (CLARITIN-D 12 HOUR) 5-120 MG tablet Take 1 tablet by mouth 2 (two) times daily. 03/16/17 03/16/18  Joni Reining, PA-C  ofloxacin (OCUFLOX) 0.3 % ophthalmic solution Place 1 drop into the left eye 4 (four) times daily. 11/25/16   Jami L Hagler, PA-C  promethazine-dextromethorphan (PROMETHAZINE-DM) 6.25-15 MG/5ML syrup Take 5 mLs by mouth 4 (four) times daily as needed for cough. 11/25/16   Jami L Hagler, PA-C    Allergies Amoxicillin and Sulfa antibiotics  No family history on file.  Social History Social History  Substance Use Topics  . Smoking status: Current Every Day Smoker    Packs/day: 0.50    Types: Cigarettes  . Smokeless tobacco: Never Used  . Alcohol use Yes    Review of Systems Constitutional: No fever/chills Eyes: No visual changes. ENT: No sore  throat. Cardiovascular: Denies chest pain. Respiratory: Denies shortness of breath. Gastrointestinal: No abdominal pain.  No nausea, no vomiting.  No diarrhea.  No constipation. Genitourinary: Negative for dysuria. Musculoskeletal: Negative for back pain. Skin: Negative for rash. Neurological: Negative for headaches, focal weakness or numbness. {**Psychiatric: Endocrine: Hematological/Lymphatic: Allergic/Immunilogical: Amoxil and sulfa antibiotics.   ____________________________________________   PHYSICAL EXAM:  VITAL SIGNS: ED Triage Vitals [03/16/17 1424]  Enc Vitals Group     BP 121/77     Pulse Rate 82     Resp 18     Temp 97.6 F (36.4 C)     Temp Source Oral     SpO2 97 %     Weight 214 lb (97.1 kg)     Height  (1.702 m)     Head Circumference      Peak Flow      Pain Score      Pain Loc      Pain Edu?      Excl. in GC?     Constitutional: Alert and oriented. Well appearing and in no acute distress. Eyes: Conjunctivae are normal. PERRL. EOMI. Head: Atraumatic. Nose: No congestion/rhinnorhea. EARS: Soft wax bilateral ear canals Mouth/Throat: Mucous membranes are moist.  Oropharynx non-erythematous. Neck: No stridor.  No cervical spine tenderness to palpation. Hematological/Lymphatic/Immunilogical: No cervical lymphadenopathy. Cardiovascular: Normal rate, regular rhythm. Grossly normal heart sounds.  Good peripheral circulation. Respiratory: Normal respiratory effort.  No retractions. Lungs CTAB. Gastrointestinal: Soft and nontender. No distention. No abdominal bruits. No CVA tenderness. Musculoskeletal: No  lower extremity tenderness nor edema.  No joint effusions. Neurologic:  Normal speech and language. No gross focal neurologic deficits are appreciated. No gait instability. Skin:  Skin is warm, dry and intact. No rash noted. Psychiatric: Mood and affect are normal. Speech and behavior are normal.  ____________________________________________     LABS (all labs ordered are listed, but only abnormal results are displayed)  Labs Reviewed - No data to display ____________________________________________  EKG   ____________________________________________  RADIOLOGY   ____________________________________________   PROCEDURES  Procedure(s) performed: None  Procedures  Critical Care performed: No  ____________________________________________   INITIAL IMPRESSION / ASSESSMENT AND PLAN / ED COURSE  Pertinent labs & imaging results that were available during my care of the patient were reviewed by me and considered in my medical decision making (see chart for details).  Eustachian tube dysfunction.      ____________________________________________   FINAL CLINICAL IMPRESSION(S) / ED DIAGNOSES  Final diagnoses:  Eustachian tube dysfunction, bilateral  Patient given discharge care instructions. Patient given prescription for the proximal and Claritin-D. Patient advised follow-up with the ENT clinic if hearing loss persists.    NEW MEDICATIONS STARTED DURING THIS VISIT:  New Prescriptions   CARBAMIDE PEROXIDE (DEBROX) 6.5 % OTIC SOLUTION    Place 5 drops into the right ear 2 (two) times daily.   LORATADINE-PSEUDOEPHEDRINE (CLARITIN-D 12 HOUR) 5-120 MG TABLET    Take 1 tablet by mouth 2 (two) times daily.     Note:  This document was prepared using Dragon voice recognition software and may include unintentional dictation errors.    Joni Reining, PA-C 03/16/17 1510    Emily Filbert, MD 03/16/17 [phone]

## 2017-03-16 NOTE — ED Notes (Signed)
See triage note  States he felt like his ears are clogged up   Had min relief to right ear but still feel like left ear is clogged

## 2018-05-01 ENCOUNTER — Emergency Department: Payer: Medicare Other

## 2018-05-01 ENCOUNTER — Encounter: Payer: Self-pay | Admitting: Emergency Medicine

## 2018-05-01 ENCOUNTER — Emergency Department
Admission: EM | Admit: 2018-05-01 | Discharge: 2018-05-01 | Disposition: A | Discharge status: Home / Self Care | Payer: Medicare Other | Attending: Emergency Medicine | Admitting: Emergency Medicine

## 2018-05-01 DIAGNOSIS — M25474 Effusion, right foot: Secondary | ICD-10-CM | POA: Diagnosis not present

## 2018-05-01 DIAGNOSIS — M79671 Pain in right foot: Secondary | ICD-10-CM | POA: Diagnosis present

## 2018-05-01 DIAGNOSIS — Z79899 Other long term (current) drug therapy: Secondary | ICD-10-CM | POA: Diagnosis not present

## 2018-05-01 DIAGNOSIS — I10 Essential (primary) hypertension: Secondary | ICD-10-CM | POA: Diagnosis not present

## 2018-05-01 DIAGNOSIS — Z7901 Long term (current) use of anticoagulants: Secondary | ICD-10-CM | POA: Diagnosis not present

## 2018-05-01 DIAGNOSIS — F17210 Nicotine dependence, cigarettes, uncomplicated: Secondary | ICD-10-CM | POA: Diagnosis not present

## 2018-05-01 NOTE — ED Triage Notes (Signed)
Pt comes into the ED via POV c/o right foot injury where he was sleeping the other night and states he twisted it funny.  Patient states he now has pain and swelling in the right foot.  Patient ambulatory to triage at this time with no difficulty noted.  Patient in NAD.

## 2018-05-01 NOTE — Discharge Instructions (Addendum)
Follow-up with your regular doctor if not better in 3 to 5 days.  Keep the foot elevated and apply ice.  If the leg begins to swell he will need to return to the emergency department for an ultrasound of the lower leg.  Continue to take your Coumadin and regular medications.

## 2018-05-01 NOTE — ED Provider Notes (Signed)
[facility] Emergency Department Provider Note  ____________________________________________   First MD Initiated Contact with Patient 05/01/18 1222     (approximate)  I have reviewed the triage vital signs and the nursing notes.   HISTORY  Chief Complaint Foot Injury    HPI Roger [name] is a 72 y.o. male since emergency department complaining of right foot pain.  States while he was sleeping and he twisted it funny.  States the foot was swollen next day.  States that the swelling has increased a little bit but he denies any other injury at this time.  He denies any numbness or tingling.  He states he has had the same symptoms prior and that it resolved on its own.  He is also taking Coumadin for his atrial fibrillation.  Past Medical History:  Diagnosis Date  . Atrial fibrillation (HCC)   . Hypertension     There are no active problems to display for this patient.   History reviewed. No pertinent surgical history.  Prior to Admission medications   Medication Sig Start Date End Date Taking? Authorizing Provider  lisinopril (PRINIVIL,ZESTRIL) 10 MG tablet Take 10 mg by mouth daily.   Yes [provider]  metoprolol tartrate (LOPRESSOR) 25 MG tablet Take 25 mg by mouth 2 (two) times daily.   Yes [provider]  warfarin (COUMADIN) 1 MG tablet Take 1 mg by mouth daily.   Yes [provider]  carbamide peroxide (DEBROX) 6.5 % otic solution Place 5 drops into the right ear 2 (two) times daily. 03/16/17   Joni Reining, PA-C  ofloxacin (OCUFLOX) 0.3 % ophthalmic solution Place 1 drop into the left eye 4 (four) times daily. 11/25/16   Hagler, Jami L, PA-C  promethazine-dextromethorphan (PROMETHAZINE-DM) 6.25-15 MG/5ML syrup Take 5 mLs by mouth 4 (four) times daily as needed for cough. 11/25/16   Hagler, Jami L, PA-C    Allergies Amoxicillin and Sulfa antibiotics  No family history on file.  Social History Social History    Tobacco Use  . Smoking status: Current Every Day Smoker    Packs/day: 0.50    Types: Cigarettes  . Smokeless tobacco: Never Used  Substance Use Topics  . Alcohol use: Yes  . Drug use: No    Review of Systems  Constitutional: No fever/chills Eyes: No visual changes. ENT: No sore throat. Respiratory: Denies cough Genitourinary: Negative for dysuria. Musculoskeletal: Negative for back pain.  Positive for right foot and ankle pain Skin: Negative for rash.    ____________________________________________   PHYSICAL EXAM:  VITAL SIGNS: ED Triage Vitals [05/01/18 1133]  Enc Vitals Group     BP (!) 131/59     Pulse Rate 90     Resp 17     Temp 98.3 F (36.8 C)     Temp Source Oral     SpO2 95 %     Weight 219 lb (99.3 kg)     Height  (1.702 m)     Head Circumference      Peak Flow      Pain Score 7     Pain Loc      Pain Edu?      Excl. in GC?     Constitutional: Alert and oriented. Well appearing and in no acute distress. Eyes: Conjunctivae are normal.  Head: Atraumatic. Nose: No congestion/rhinnorhea. Mouth/Throat: Mucous membranes are moist.   Cardiovascular: Normal rate, regular rhythm.  Heart sounds are normal Respiratory: Normal respiratory effort.  No retractions, lungs are clear to auscultate  GU: deferred Musculoskeletal: FROM all extremities, warm and well perfused, the right foot and ankle are slightly swollen.  There is no redness or pus noted on the ankle.  There is no calf tenderness.  He is neurovascularly intact. Neurologic:  Normal speech and language.  Skin:  Skin is warm, dry and intact. No rash noted. Psychiatric: Mood and affect are normal. Speech and behavior are normal.  ____________________________________________   LABS (all labs ordered are listed, but only abnormal results are displayed)  Labs Reviewed - No data to  display ____________________________________________   ____________________________________________  RADIOLOGY  X-ray of the right foot is negative for any acute abnormality  ____________________________________________   PROCEDURES  Procedure(s) performed: Ace wrap applied by nursing staff  Procedures    ____________________________________________   INITIAL IMPRESSION / ASSESSMENT AND PLAN / ED COURSE  Pertinent labs & imaging results that were available during my care of the patient were reviewed by me and considered in my medical decision making (see chart for details).  Patient is a 72 year old male presents emergency department complaining of right foot pain.  He states he got twisted up while sleeping the other night.  He states it has been swollen and painful since.  He is also on Coumadin for his atrial fibrillation.  Physical exam the right foot is mildly tender along the lateral aspect.  The ankle is minimally tender.  There is a small amount of swelling noted.  There is no calf tenderness or swelling.  xray of the right foot is negative for any acute abnormality  An Ace wrap was applied by nursing staff.  X-ray results were discussed with patient.  He is to keep foot elevated and iced.  If the swelling is increasing in bleeding into the calf he should return to the emergency department for further evaluation.  He states he understands to comply with our instructions.  Was discharged in stable condition      As part of my medical decision making, I reviewed the following data within the electronic MEDICAL RECORD NUMBER Nursing notes reviewed and incorporated, Old chart reviewed, Radiograph reviewed x-ray of the right foot is negative, Notes from prior ED visits and  Controlled Substance Database  ____________________________________________   FINAL CLINICAL IMPRESSION(S) / ED DIAGNOSES  Final diagnoses:  Swelling of foot joint, right      NEW MEDICATIONS  STARTED DURING THIS VISIT:  Discharge Medication List as of 05/01/2018 12:54 PM       Note:  This document was prepared using Dragon voice recognition software and may include unintentional dictation errors.    Faythe Ghee, PA-C 05/01/18 1453    Emily Filbert, MD 05/01/18 [phone]

## 2019-04-19 ENCOUNTER — Other Ambulatory Visit: Payer: Self-pay | Admitting: Cardiovascular Disease

## 2019-04-19 ENCOUNTER — Other Ambulatory Visit: Payer: Self-pay

## 2019-04-19 ENCOUNTER — Inpatient Hospital Stay
Admission: EM | Admit: 2019-04-19 | Discharge: 2019-04-21 | DRG: 247 | Disposition: A | Discharge status: Home Health | Payer: Medicare Other | Attending: Internal Medicine | Admitting: Internal Medicine

## 2019-04-19 ENCOUNTER — Emergency Department: Payer: Medicare Other

## 2019-04-19 ENCOUNTER — Inpatient Hospital Stay
Admit: 2019-04-19 | Discharge: 2019-04-19 | Disposition: A | Discharge status: Home / Self Care | Payer: Medicare Other | Attending: Internal Medicine | Admitting: Internal Medicine

## 2019-04-19 ENCOUNTER — Encounter: Payer: Self-pay | Admitting: Emergency Medicine

## 2019-04-19 DIAGNOSIS — I10 Essential (primary) hypertension: Secondary | ICD-10-CM | POA: Diagnosis present

## 2019-04-19 DIAGNOSIS — Z888 Allergy status to other drugs, medicaments and biological substances status: Secondary | ICD-10-CM | POA: Diagnosis not present

## 2019-04-19 DIAGNOSIS — I462 Cardiac arrest due to underlying cardiac condition: Secondary | ICD-10-CM | POA: Diagnosis present

## 2019-04-19 DIAGNOSIS — Y9389 Activity, other specified: Secondary | ICD-10-CM

## 2019-04-19 DIAGNOSIS — I482 Chronic atrial fibrillation, unspecified: Secondary | ICD-10-CM | POA: Diagnosis present

## 2019-04-19 DIAGNOSIS — F17210 Nicotine dependence, cigarettes, uncomplicated: Secondary | ICD-10-CM | POA: Diagnosis present

## 2019-04-19 DIAGNOSIS — S2231XA Fracture of one rib, right side, initial encounter for closed fracture: Secondary | ICD-10-CM | POA: Diagnosis present

## 2019-04-19 DIAGNOSIS — Y92238 Other place in hospital as the place of occurrence of the external cause: Secondary | ICD-10-CM | POA: Diagnosis present

## 2019-04-19 DIAGNOSIS — M9689 Other intraoperative and postprocedural complications and disorders of the musculoskeletal system: Secondary | ICD-10-CM | POA: Diagnosis present

## 2019-04-19 DIAGNOSIS — J398 Other specified diseases of upper respiratory tract: Secondary | ICD-10-CM | POA: Diagnosis present

## 2019-04-19 DIAGNOSIS — X58XXXA Exposure to other specified factors, initial encounter: Secondary | ICD-10-CM | POA: Diagnosis present

## 2019-04-19 DIAGNOSIS — Z7951 Long term (current) use of inhaled steroids: Secondary | ICD-10-CM | POA: Diagnosis not present

## 2019-04-19 DIAGNOSIS — Z79899 Other long term (current) drug therapy: Secondary | ICD-10-CM

## 2019-04-19 DIAGNOSIS — Z7901 Long term (current) use of anticoagulants: Secondary | ICD-10-CM

## 2019-04-19 DIAGNOSIS — Z881 Allergy status to other antibiotic agents status: Secondary | ICD-10-CM | POA: Diagnosis not present

## 2019-04-19 DIAGNOSIS — Z716 Tobacco abuse counseling: Secondary | ICD-10-CM

## 2019-04-19 DIAGNOSIS — I4901 Ventricular fibrillation: Secondary | ICD-10-CM | POA: Diagnosis present

## 2019-04-19 DIAGNOSIS — Z825 Family history of asthma and other chronic lower respiratory diseases: Secondary | ICD-10-CM

## 2019-04-19 DIAGNOSIS — R42 Dizziness and giddiness: Secondary | ICD-10-CM

## 2019-04-19 DIAGNOSIS — I447 Left bundle-branch block, unspecified: Secondary | ICD-10-CM | POA: Diagnosis present

## 2019-04-19 DIAGNOSIS — Z1159 Encounter for screening for other viral diseases: Secondary | ICD-10-CM

## 2019-04-19 DIAGNOSIS — Z882 Allergy status to sulfonamides status: Secondary | ICD-10-CM

## 2019-04-19 LAB — COMPREHENSIVE METABOLIC PANEL
ALT: 13 U/L (ref 0–44)
AST: 17 U/L (ref 15–41)
Albumin: 3.4 g/dL — ABNORMAL LOW (ref 3.5–5.0)
Alkaline Phosphatase: 66 U/L (ref 38–126)
Anion gap: 10 (ref 5–15)
BUN: 20 mg/dL (ref 8–23)
CO2: 26 mmol/L (ref 22–32)
Calcium: 8.5 mg/dL — ABNORMAL LOW (ref 8.9–10.3)
Chloride: 104 mmol/L (ref 98–111)
Creatinine, Ser: 0.79 mg/dL (ref 0.61–1.24)
GFR calc Af Amer: >60 mL/min (ref >60)
GFR calc non Af Amer: >60 mL/min (ref >60)
Glucose, Bld: 184 mg/dL — ABNORMAL HIGH (ref 70–99)
Potassium: 3.9 mmol/L (ref 3.5–5.1)
Sodium: 140 mmol/L (ref 135–145)
Total Bilirubin: 1.5 mg/dL — ABNORMAL HIGH (ref 0.3–1.2)
Total Protein: 6.1 g/dL — ABNORMAL LOW (ref 6.5–8.1)

## 2019-04-19 LAB — MRSA PCR SCREENING: MRSA by PCR: NEGATIVE

## 2019-04-19 LAB — BASIC METABOLIC PANEL
Anion gap: 9 (ref 5–15)
BUN: 19 mg/dL (ref 8–23)
CO2: 27 mmol/L (ref 22–32)
Calcium: 8.5 mg/dL — ABNORMAL LOW (ref 8.9–10.3)
Chloride: 103 mmol/L (ref 98–111)
Creatinine, Ser: 0.87 mg/dL (ref 0.61–1.24)
GFR calc Af Amer: >60 mL/min (ref >60)
GFR calc non Af Amer: >60 mL/min (ref >60)
Glucose, Bld: 126 mg/dL — ABNORMAL HIGH (ref 70–99)
Potassium: 4.8 mmol/L (ref 3.5–5.1)
Sodium: 139 mmol/L (ref 135–145)

## 2019-04-19 LAB — CBC WITH DIFFERENTIAL/PLATELET
Abs Immature Granulocytes: 0.23 10*3/uL — ABNORMAL HIGH (ref 0.00–0.07)
Basophils Absolute: 0.1 10*3/uL (ref 0.0–0.1)
Basophils Relative: 1 %
Eosinophils Absolute: 0.1 10*3/uL (ref 0.0–0.5)
Eosinophils Relative: 1 %
HCT: 43 % (ref 39.0–52.0)
Hemoglobin: 13.9 g/dL (ref 13.0–17.0)
Immature Granulocytes: 2 %
Lymphocytes Relative: 12 %
Lymphs Abs: 1.7 10*3/uL (ref 0.7–4.0)
MCH: 30.8 pg (ref 26.0–34.0)
MCHC: 32.3 g/dL (ref 30.0–36.0)
MCV: 95.1 fL (ref 80.0–100.0)
Monocytes Absolute: 0.7 10*3/uL (ref 0.1–1.0)
Monocytes Relative: 5 %
Neutro Abs: 10.8 10*3/uL — ABNORMAL HIGH (ref 1.7–7.7)
Neutrophils Relative %: 79 %
Platelets: 292 10*3/uL (ref 150–400)
RBC: 4.52 MIL/uL (ref 4.22–5.81)
RDW: 14.9 % (ref 11.5–15.5)
WBC: 13.7 10*3/uL — ABNORMAL HIGH (ref 4.0–10.5)
nRBC: 0 % (ref 0.0–0.2)

## 2019-04-19 LAB — LIPASE, BLOOD: Lipase: 25 U/L (ref 11–51)

## 2019-04-19 LAB — HEPARIN LEVEL (UNFRACTIONATED): Heparin Unfractionated: 0.36 IU/mL (ref 0.30–0.70)

## 2019-04-19 LAB — PROTIME-INR
INR: 2.4 — ABNORMAL HIGH (ref 0.8–1.2)
INR: 2.5 — ABNORMAL HIGH (ref 0.8–1.2)
Prothrombin Time: 25.9 seconds — ABNORMAL HIGH (ref 11.4–15.2)
Prothrombin Time: 26.6 s — ABNORMAL HIGH (ref 11.4–15.2)

## 2019-04-19 LAB — GLUCOSE, CAPILLARY: Glucose-Capillary: 119 mg/dL — ABNORMAL HIGH (ref 70–99)

## 2019-04-19 LAB — MAGNESIUM: Magnesium: 4 mg/dL — ABNORMAL HIGH (ref 1.7–2.4)

## 2019-04-19 LAB — SARS CORONAVIRUS 2 BY RT PCR (HOSPITAL ORDER, PERFORMED IN ~~LOC~~ HOSPITAL LAB): SARS Coronavirus 2: NEGATIVE

## 2019-04-19 LAB — APTT: aPTT: 41 seconds — ABNORMAL HIGH (ref 24–36)

## 2019-04-19 LAB — TROPONIN I: Troponin I: <0.03 ng/mL (ref <0.03)

## 2019-04-19 MED ORDER — ASPIRIN 81 MG PO CHEW
81.0000 mg | CHEWABLE_TABLET | ORAL | Status: AC
  Administered 2019-04-20: 81 mg via ORAL
  Filled 2019-04-19: qty 1

## 2019-04-19 MED ORDER — LISINOPRIL 5 MG PO TABS
5.0000 mg | ORAL_TABLET | Freq: Every day | ORAL | Status: DC
  Administered 2019-04-21: 5 mg via ORAL
  Filled 2019-04-19: qty 1

## 2019-04-19 MED ORDER — SODIUM CHLORIDE 0.9 % WEIGHT BASED INFUSION
1.0000 mL/kg/h | INTRAVENOUS | Status: DC
  Administered 2019-04-20: 1 mL/kg/h via INTRAVENOUS

## 2019-04-19 MED ORDER — PHYTONADIONE 5 MG PO TABS
10.0000 mg | ORAL_TABLET | Freq: Once | ORAL | Status: AC
  Administered 2019-04-19: 10 mg via ORAL
  Filled 2019-04-19: qty 2

## 2019-04-19 MED ORDER — AMIODARONE IV BOLUS ONLY 150 MG/100ML
150.0000 mg | Freq: Once | INTRAVENOUS | Status: AC
  Administered 2019-04-19: 150 mg via INTRAVENOUS
  Filled 2019-04-19: qty 100

## 2019-04-19 MED ORDER — ACETAMINOPHEN 325 MG PO TABS
650.0000 mg | ORAL_TABLET | ORAL | Status: DC | PRN
  Administered 2019-04-19 – 2019-04-21 (×3): 650 mg via ORAL
  Filled 2019-04-19 (×3): qty 2

## 2019-04-19 MED ORDER — SODIUM CHLORIDE 0.9% FLUSH
3.0000 mL | Freq: Two times a day (BID) | INTRAVENOUS | Status: DC
  Filled 2019-04-19: qty 3

## 2019-04-19 MED ORDER — ASPIRIN EC 325 MG PO TBEC: 325.0000 mg | DELAYED_RELEASE_TABLET | Freq: Every day | ORAL | Status: DC

## 2019-04-19 MED ORDER — AMIODARONE HCL IN DEXTROSE 360-4.14 MG/200ML-% IV SOLN
30.0000 mg/h | INTRAVENOUS | Status: DC
  Administered 2019-04-19 – 2019-04-21 (×4): 30 mg/h via INTRAVENOUS
  Filled 2019-04-19 (×6): qty 200

## 2019-04-19 MED ORDER — MAGNESIUM SULFATE 2 GM/50ML IV SOLN
2.0000 g | Freq: Once | INTRAVENOUS | Status: AC
  Administered 2019-04-19: 2 g via INTRAVENOUS
  Filled 2019-04-19: qty 50

## 2019-04-19 MED ORDER — SODIUM CHLORIDE 0.9 % WEIGHT BASED INFUSION
3.0000 mL/kg/h | INTRAVENOUS | Status: AC
  Administered 2019-04-20: 3 mL/kg/h via INTRAVENOUS

## 2019-04-19 MED ORDER — SODIUM CHLORIDE 0.9 % IV SOLN: 250.0000 mL | INTRAVENOUS | Status: DC | PRN

## 2019-04-19 MED ORDER — HEPARIN (PORCINE) 25000 UT/250ML-% IV SOLN
1050.0000 [IU]/h | INTRAVENOUS | Status: DC
  Administered 2019-04-19 – 2019-04-20 (×3): 1050 [IU]/h via INTRAVENOUS
  Filled 2019-04-19 (×2): qty 250

## 2019-04-19 MED ORDER — ASPIRIN 81 MG PO CHEW
324.0000 mg | CHEWABLE_TABLET | Freq: Once | ORAL | Status: AC
  Administered 2019-04-19: 324 mg via ORAL
  Filled 2019-04-19: qty 4

## 2019-04-19 MED ORDER — HEPARIN (PORCINE) 25000 UT/250ML-% IV SOLN: 12.0000 [IU]/kg/h | INTRAVENOUS | Status: DC

## 2019-04-19 MED ORDER — ONDANSETRON HCL 4 MG/2ML IJ SOLN: 4.0000 mg | Freq: Four times a day (QID) | INTRAMUSCULAR | Status: DC | PRN

## 2019-04-19 MED ORDER — METOPROLOL TARTRATE 25 MG PO TABS
25.0000 mg | ORAL_TABLET | Freq: Two times a day (BID) | ORAL | Status: DC
  Administered 2019-04-19 – 2019-04-21 (×3): 25 mg via ORAL
  Filled 2019-04-19 (×3): qty 1

## 2019-04-19 MED ORDER — SODIUM CHLORIDE 0.9% FLUSH: 3.0000 mL | INTRAVENOUS | Status: DC | PRN

## 2019-04-19 MED ORDER — AMIODARONE HCL IN DEXTROSE 360-4.14 MG/200ML-% IV SOLN
60.0000 mg/h | INTRAVENOUS | Status: AC
  Administered 2019-04-19: 60 mg/h via INTRAVENOUS
  Filled 2019-04-19: qty 200

## 2019-04-19 NOTE — Consult Note (Signed)
Roger [name] is a 73 y.o. male  [id number]  Primary Cardiologist: New patient to Dr. Adrian [name] Reason for Consultation: Ventricular fibrillation and cardiac arrest  HPI: 72yo patient with a history of chronic rate controlled Afib on warfarin, and history of hypertension presented to ER with dizziness. As he was resting in his room his monitor went off and he was found to be in ventricular fibrillation and unresponsive. CPR and bag mask ventilation initiated was immdiately started and shock was delivered and CPR resumed. About 1 minute later patient woke up and heart rhythm was back in atrial fibrillation on monitor. Amiodarone bolus and magnesium given.  Review of Systems: Pt states he is feeling a little soreness in his chest but no significant pain and denies shortness of breath. He does not remember what happened this morning.   Past Medical History:  Diagnosis Date  . Atrial fibrillation (HCC)   . Hypertension     (Not in a hospital admission)    . phytonadione  10 mg Oral Once    Infusions: . amiodarone 60 mg/hr (04/19/19 0809)  . amiodarone    . heparin 1,050 Units/hr (04/19/19 0102)    Allergies  Allergen Reactions  . Amoxicillin Anaphylaxis  . Sulfa Antibiotics Anaphylaxis  . Pravastatin Other (See Comments)  . Rosuvastatin Other (See Comments)    Social History   Socioeconomic History  . Marital status: Single    Spouse name: Not on file  . Number of children: Not on file  . Years of education: Not on file  . Highest education level: Not on file  Occupational History  . Not on file  Social Needs  . Financial resource strain: Not on file  . Food insecurity:    Worry: Not on file    Inability: Not on file  . Transportation needs:    Medical: Not on file    Non-medical: Not on file  Tobacco Use  . Smoking status: Current Every Day Smoker    Packs/day: 0.50    Types: Cigarettes  . Smokeless tobacco: Never Used  Substance and Sexual Activity   . Alcohol use: Yes  . Drug use: No  . Sexual activity: Not on file  Lifestyle  . Physical activity:    Days per week: Not on file    Minutes per session: Not on file  . Stress: Not on file  Relationships  . Social connections:    Talks on phone: Not on file    Gets together: Not on file    Attends religious service: Not on file    Active member of club or organization: Not on file    Attends meetings of clubs or organizations: Not on file    Relationship status: Not on file  . Intimate partner violence:    Fear of current or ex partner: Not on file    Emotionally abused: Not on file    Physically abused: Not on file    Forced sexual activity: Not on file  Other Topics Concern  . Not on file  Social History Narrative  . Not on file    No family history on file.  PHYSICAL EXAM: Vitals:   04/19/19 0730 04/19/19 0849  BP: 95/81 119/81  Pulse: (!) 50 (!) 56  Resp: (!) 21 16  Temp:    SpO2: 99% 100%    No intake or output data in the 24 hours ending 04/19/19 0946  General:  Well appearing. No respiratory difficulty HEENT:  normal Neck: supple. no JVD. Carotids 2+ bilat; no bruits. No lymphadenopathy or thryomegaly appreciated. Cor: PMI nondisplaced. Regular rate & rhythm. No rubs, gallops or murmurs. Lungs: clear Abdomen: soft, nontender, nondistended. No hepatosplenomegaly. No bruits or masses. Good bowel sounds. Extremities: no cyanosis, clubbing, rash, edema Neuro: alert & oriented x 3, cranial nerves grossly intact. moves all 4 extremities w/o difficulty. Affect pleasant.  ECG: Atrial fibrillation 102bpm Left bundle branch block  Results for orders placed or performed during the hospital encounter of 04/19/19 (from the past 24 hour(s))  SARS Coronavirus 2 (CEPHEID - Performed in [facility] hospital lab), [facility] Order     Status: None   Collection Time: 04/19/19  7:01 AM  Result Value Ref Range   SARS Coronavirus 2 NEGATIVE NEGATIVE  CBC with  Differential/Platelet     Status: Abnormal   Collection Time: 04/19/19  7:35 AM  Result Value Ref Range   WBC 13.7 (H) 4.0 - 10.5 K/uL   RBC 4.52 4.22 - 5.81 MIL/uL   Hemoglobin 13.9 13.0 - 17.0 g/dL   HCT 67.6 19.5 - 09.3 %   MCV 95.1 80.0 - 100.0 fL   MCH 30.8 26.0 - 34.0 pg   MCHC 32.3 30.0 - 36.0 g/dL   RDW 26.7 12.4 - 58.0 %   Platelets 292 150 - 400 K/uL   nRBC 0.0 0.0 - 0.2 %   Neutrophils Relative % 79 %   Neutro Abs 10.8 (H) 1.7 - 7.7 K/uL   Lymphocytes Relative 12 %   Lymphs Abs 1.7 0.7 - 4.0 K/uL   Monocytes Relative 5 %   Monocytes Absolute 0.7 0.1 - 1.0 K/uL   Eosinophils Relative 1 %   Eosinophils Absolute 0.1 0.0 - 0.5 K/uL   Basophils Relative 1 %   Basophils Absolute 0.1 0.0 - 0.1 K/uL   Immature Granulocytes 2 %   Abs Immature Granulocytes 0.23 (H) 0.00 - 0.07 K/uL  Troponin I - ONCE - STAT     Status: None   Collection Time: 04/19/19  7:35 AM  Result Value Ref Range   Troponin I <0.03 <0.03 ng/mL  Magnesium     Status: Abnormal   Collection Time: 04/19/19  7:35 AM  Result Value Ref Range   Magnesium 4.0 (H) 1.7 - 2.4 mg/dL  Protime-INR     Status: Abnormal   Collection Time: 04/19/19  7:35 AM  Result Value Ref Range   Prothrombin Time 25.9 (H) 11.4 - 15.2 seconds   INR 2.4 (H) 0.8 - 1.2  APTT     Status: Abnormal   Collection Time: 04/19/19  7:35 AM  Result Value Ref Range   aPTT 41 (H) 24 - 36 seconds  Comprehensive metabolic panel     Status: Abnormal   Collection Time: 04/19/19  7:35 AM  Result Value Ref Range   Sodium 140 135 - 145 mmol/L   Potassium 3.9 3.5 - 5.1 mmol/L   Chloride 104 98 - 111 mmol/L   CO2 26 22 - 32 mmol/L   Glucose, Bld 184 (H) 70 - 99 mg/dL   BUN 20 8 - 23 mg/dL   Creatinine, Ser 9.98 0.61 - 1.24 mg/dL   Calcium 8.5 (L) 8.9 - 10.3 mg/dL   Total Protein 6.1 (L) 6.5 - 8.1 g/dL   Albumin 3.4 (L) 3.5 - 5.0 g/dL   AST 17 15 - 41 U/L   ALT 13 0 - 44 U/L   Alkaline Phosphatase 66 38 - 126 U/L  Total Bilirubin 1.5 (H) 0.3  - 1.2 mg/dL   GFR calc non Af Amer >60 >60 mL/min   GFR calc Af Amer >60 >60 mL/min   Anion gap 10 5 - 15  Lipase, blood     Status: None   Collection Time: 04/19/19  7:35 AM  Result Value Ref Range   Lipase 25 11 - 51 U/L   Dg Chest Portable 1 View  Result Date: 04/19/2019 CLINICAL DATA:  73 year old male status post VFib arrest. Possible sternal fracture. EXAM: PORTABLE CHEST 1 VIEW COMPARISON:  11/25/2016 and earlier. FINDINGS: Portable AP semi upright view at 0707 hours. Pacer or resuscitation pads project over the chest. Low lung volumes and somewhat lordotic view. Mediastinal contours appear stable. Allowing for portable technique the lungs are clear. 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 There is a right lateral 3rd rib fracture. No other displaced rib fracture identified. Negative visible bowel gas pattern. IMPRESSION: 1. Right lateral 3rd rib fracture. No acute cardiopulmonary abnormality identified. 2. 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 Electronically Signed   By: Odessa FlemingH  Hall M.D.   On: 04/19/2019 07:30     ASSESSMENT AND PLAN:  Ventricular fibrillation and cardiac arrest: Monitor today and will schedule cardiac cath tomorrow morning. Verbal consent obtained at bedside.  Chronic rate controlled atrial fibrillation: On amiodarone drip currently. Typically is anticoagulated with warfarin. Hold warfarin for cath tomorrow and  Vitamin K given for INR of 2.4. INR to be checked prior to cath. Restart warfarin after cath tomorrow.  Caroleen HammanKristin Gabryel Talamo, NP-C Cell: [phone]

## 2019-04-19 NOTE — ED Notes (Signed)
This RN made 2nd attempt to give report and was told receiving RN is with critical pt. Ascom number given and waiting for call to get report.

## 2019-04-19 NOTE — TOC Initial Note (Addendum)
Transition of Care [facility]) - Initial/Assessment Note    Patient Details  Name: Roger [name] MRN: [id number] Date of Birth: Mar 15, 1946  Transition of [facility]) CM/SW Contact:    Allayne Butcher, RN Phone Number: 04/19/2019, 1:59 PM  Clinical Narrative:                 Patient admitted for V Fib.  Patient is currently in the ICU scheduled to go to the cath lab tomorrow.  Patient has a history of afib and is on warfarin.  Patient reports that he is followed at the [facility], [place] has been notified of patient admission, the patient does not wish to transfer at this time and expected length of stay is 1-2 days.  Patient reports that he lives alone, his only family is a brother in [place].  Patient reports that he is independent and requires no assistive devises.  Patient drives and has adequate transportation.  Patient gets prescriptions at [facility].  No discharge needs identified at this time.    Expected Discharge Plan: Home/Self Care Barriers to Discharge: Continued Medical Work up   Patient Goals and CMS Choice        Expected Discharge Plan and Services Expected Discharge Plan: Home/Self Care       Living arrangements for the past 2 months: Apartment                                      Prior Living Arrangements/Services Living arrangements for the past 2 months: Apartment Lives with:: Self   Do you feel safe going back to the place where you live?: Yes      Need for Family Participation in Patient Care: Yes (Comment) Care giver support system in place?: No (comment)(Pt has a brother but he lives in [place])   Criminal Activity/Legal Involvement Pertinent to Current Situation/Hospitalization: No - Comment as needed  Activities of Daily Living      Permission Sought/Granted                  Emotional Assessment Appearance:: Appears stated age Attitude/Demeanor/Rapport: Engaged Affect (typically observed): Accepting Orientation: : Oriented to Self,  Oriented to Place, Oriented to  Time, Oriented to Situation Alcohol / Substance Use: Tobacco Use Psych Involvement: No (comment)  Admission diagnosis:  Ventricular fibrillation (HCC) [I49.01] Dizziness [R42] Closed fracture of one rib of right side, initial encounter [S22.31XA] Patient Active Problem List   Diagnosis Date Noted  . Ventricular fibrillation (HCC) 04/19/2019   PCP:  Kandyce Rud, MD Pharmacy:   [facility] [phone] [place], [place] - [address] AT [facility] [address] [place] [address] Phone: [phone] Fax: [phone]     Social Determinants of Health (SDOH) Interventions    Readmission Risk Interventions No flowsheet data found.

## 2019-04-19 NOTE — Consult Note (Signed)
ANTICOAGULATION CONSULT NOTE   Pharmacy Consult for Heparin Dosing  Indication: NSTEMI   Allergies  Allergen Reactions  . Amoxicillin Anaphylaxis  . Sulfa Antibiotics Anaphylaxis    Patient Measurements: Height: 5\' 8"  (172.7 cm) Weight: 230 lb (104.3 kg) IBW/kg (Calculated) : 68.4 Heparin Dosing Weight: 91.1 kg   Vital Signs: Temp: 97.3 F (36.3 C) (05/13 0630) Temp Source: Oral (05/13 0630) BP: 133/86 (05/13 0700) Pulse Rate: 40 (05/13 0715)  Labs: Recent Labs    04/19/19 0735  HGB 13.9  HCT 43.0  PLT 292  APTT 41*  LABPROT 25.9*  INR 2.4*  CREATININE 0.79  TROPONINI <0.03    Estimated Creatinine Clearance: 100.6 mL/min (by C-G formula based on SCr of 0.79 mg/dL).  Tropinin <0.03  Medications:  Warfarin 1 mg - last taken on 04/18/2019 @ ~9:30 pm (per patient report when I called the room)  Assessment: Baseline labs have been obtained for this patient. Patient has a h/o afib. He does not meet the STEMI criteria.  Given his INR is therapeutic, patient does not need a bolus dose.  Goal of Therapy:  Heparin level 0.3-0.7 units/ml    Plan:  Will start heparin infusion at a rate of 1050 units/hr.  Will check HL in 8 hours @ 1700, per protocol.  Will check CBC daily.   Ramonita Koenig R Dene Landsberg,PharmD 04/19/2019,8:19 AM

## 2019-04-19 NOTE — Progress Notes (Signed)
*  PRELIMINARY RESULTS* Echocardiogram 2D Echocardiogram has been performed.  Roger [name] 04/19/2019, 1:33 PM

## 2019-04-19 NOTE — ED Notes (Addendum)
Pt v-tach on the monitor that progressed into v-fib. Pt non-responsive to any stimuli at this time. Dr. [place] Cerise at bedside, CPR initiated and bag mask initiated by this RN, Britt Boozer, RN and Lavonia NT ay  [phone]  [phone]: Shock initiated and CPR resumed.   0654: Pt waking up and opening eyes, CPR stopped. Pt back in atrial fibrillation on monitor.    20G right hand: 150 amiodarone 0654  0656: 2G Mg given intravenously in right hand IV.

## 2019-04-19 NOTE — H&P (Signed)
[facility] - [place] at [facility]   PATIENT NAME: Roger [name]    MR#:  [id number]  DATE OF BIRTH:  1946-03-11  DATE OF ADMISSION:  04/19/2019  PRIMARY CARE PHYSICIAN: Kandyce RudBabaoff, Marcus, MD   REQUESTING/REFERRING PHYSICIAN: Loleta RoseForbach, Cory, MD  CHIEF COMPLAINT:   Chief Complaint  Patient presents with  . Dizziness    HISTORY OF PRESENT ILLNESS:  Roger BerryRobert Rossie  is a 73 y.o. male with a known history of atrial fibrillation and hypertension who says that he takes warfarin for the A. fib who presents for evaluation of dizziness.  He said that he felt little bit dizzy when he got up this morning.  The patient was placed in his room at about 6:40 AM and he was put on the monitor. His monitor showed V. Fib and had arrest. EDP did successful resuscitation - CPR and bag mask initiated - Shocked.  He responded well to resuscitation efforts and was back in atrial fibrillation.  He was started on amiodarone drip after bolus.  He was also given IV magnesium by ED provider.  He has some chest soreness due to CPR but no other symptoms at this time when I saw him.  He reports that his primary care stopped HCTZ 4-5 months ago as BP running low but since then he is gaining weight. PAST MEDICAL HISTORY:   Past Medical History:  Diagnosis Date  . Atrial fibrillation (HCC)   . Hypertension     PAST SURGICAL HISTORY:  History reviewed. No pertinent surgical history.  SOCIAL HISTORY:   Social History   Tobacco Use  . Smoking status: Current Every Day Smoker    Packs/day: 0.50    Types: Cigarettes  . Smokeless tobacco: Never Used  Substance Use Topics  . Alcohol use: Yes    FAMILY HISTORY:  No family history on file. Grand father might have died of heart attack Dad: COPD DRUG ALLERGIES:   Allergies  Allergen Reactions  . Amoxicillin Anaphylaxis  . Sulfa Antibiotics Anaphylaxis  . Pravastatin Other (See Comments)  . Rosuvastatin Other (See Comments)    REVIEW OF  SYSTEMS:   Review of Systems  Constitutional: Negative for diaphoresis, fever, malaise/fatigue and weight loss.  HENT: Negative for ear discharge, ear pain, hearing loss, nosebleeds, sore throat and tinnitus.   Eyes: Negative for blurred vision and pain.  Respiratory: Negative for cough, hemoptysis, shortness of breath and wheezing.   Cardiovascular: Positive for chest pain. Negative for palpitations, orthopnea and leg swelling.  Gastrointestinal: Negative for abdominal pain, blood in stool, constipation, diarrhea, heartburn, nausea and vomiting.  Genitourinary: Negative for dysuria, frequency and urgency.  Musculoskeletal: Negative for back pain and myalgias.  Skin: Negative for itching and rash.  Neurological: Positive for dizziness. Negative for tingling, tremors, focal weakness, seizures, weakness and headaches.  Psychiatric/Behavioral: Negative for depression. The patient is not nervous/anxious.    MEDICATIONS AT HOME:   Prior to Admission medications   Medication Sig Start Date End Date Taking? Authorizing Provider  fluticasone (FLONASE) 50 MCG/ACT nasal spray Place 2 sprays into the nose daily. 04/08/19 04/07/20 Yes [provider]  lisinopril (PRINIVIL,ZESTRIL) 10 MG tablet Take 5 mg by mouth daily.    Yes [provider]  metoprolol tartrate (LOPRESSOR) 25 MG tablet Take 25 mg by mouth 2 (two) times daily.   Yes [provider]  warfarin (COUMADIN) 1 MG tablet Take 1 mg by mouth daily. Wed, and sat takes 1 mg, all other days 2 mg.  Depends on blood thinner test.   Yes [provider]      VITAL SIGNS:  Blood pressure (!) 148/80, pulse 76, temperature 98.3 F (36.8 C), temperature source Oral, resp. rate 17, height  (1.727 m), weight 103.3 kg, SpO2 100 %.  PHYSICAL EXAMINATION:  Physical Exam  GENERAL:  73 y.o.-year-old patient lying in the bed with no acute distress.  EYES: Pupils equal, round, reactive to light and accommodation. No  scleral icterus. Extraocular muscles intact.  HEENT: Head atraumatic, normocephalic. Oropharynx and nasopharynx clear.  NECK:  Supple, no jugular venous distention. No thyroid enlargement, no tenderness.  LUNGS: Normal breath sounds bilaterally, no wheezing, rales,rhonchi or crepitation. No use of accessory muscles of respiration.  CARDIOVASCULAR: S1, S2 normal. No murmurs, rubs, or gallops.  Palpable chest wall tenderness likely from CPR ABDOMEN: Soft, nontender, nondistended. Bowel sounds present. No organomegaly or mass.  EXTREMITIES: No pedal edema, cyanosis, or clubbing.  NEUROLOGIC: Cranial nerves II through XII are intact. Muscle strength 5/5 in all extremities. Sensation intact. Gait not checked.  PSYCHIATRIC: The patient is alert and oriented x 3.  SKIN: No obvious rash, lesion, or ulcer.  LABORATORY PANEL:   CBC Recent Labs  Lab 04/19/19 0735  WBC 13.7*  HGB 13.9  HCT 43.0  PLT 292   ------------------------------------------------------------------------------------------------------------------  Chemistries  Recent Labs  Lab 04/19/19 0735  NA 140  K 3.9  CL 104  CO2 26  GLUCOSE 184*  BUN 20  CREATININE 0.79  CALCIUM 8.5*  MG 4.0*  AST 17  ALT 13  ALKPHOS 66  BILITOT 1.5*   ------------------------------------------------------------------------------------------------------------------  Cardiac Enzymes Recent Labs  Lab 04/19/19 0735  TROPONINI <0.03   ------------------------------------------------------------------------------------------------------------------  RADIOLOGY:  Dg Chest Portable 1 View  Result Date: 04/19/2019 CLINICAL DATA:  73 year old male status post VFib arrest. Possible sternal fracture. EXAM: PORTABLE CHEST 1 VIEW COMPARISON:  11/25/2016 and earlier. FINDINGS: Portable AP semi upright view at 0707 hours. Pacer or resuscitation pads project over the chest. Low lung volumes and somewhat lordotic view. Mediastinal contours appear  stable. Allowing for portable technique the lungs are clear. 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 There is a right lateral 3rd rib fracture. No other displaced rib fracture identified. Negative visible bowel gas pattern. IMPRESSION: 1. Right lateral 3rd rib fracture. No acute cardiopulmonary abnormality identified. 2. 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 Electronically Signed   By: Odessa Fleming M.D.   On: 04/19/2019 07:30   IMPRESSION AND PLAN:  73 year old male being admitted after V. fib arrest  *V. fib arrest: Patient is in A. fib with controlled rate -Continue amiodarone drip, will hold off heparin drip as his INR is therapeutic (2.4) -Admit him to stepdown for close monitoring -Case discussed with cardiology Dr. Adrian Blackwater was planning for cardiac catheterization tomorrow morning -He recommended 10 mg of vitamin K - I have ordered -Obtain 2D echo -Serial troponins  *Atrial fibrillation -On Coumadin with therapeutic INR of 2.4 -We will hold Coumadin for possible cath tomorrow -Cardiology consultation, monitor on telemetry -Rate is controlled on amiodarone drip  *Hypertension -Resume Lopressor and lisinopril as BP/HR permits  *Dizziness: Could be due to cardiac arrhythmia -Monitor  -He is at high risk for recurrent serious cardiac arrhythmia.  I would watch him in stepdown for now till cardiac evaluation done   All the records are reviewed and case discussed with ED provider. Management plans discussed with the patient, nursing, intensivist Dr. Sung Amabile, Dr. York Cerise and  they are in agreement.  CODE STATUS: Full code  TOTAL TIME (critical care) TAKING CARE OF THIS PATIENT: 45 minutes.    Delfino Lovett M.D on 04/19/2019 at 1:53 PM  Between 7am to 6pm - Pager - [phone]  After 6pm go to [email or web address] - Social research officer, government  [facility]  Office  [phone]  CC: Primary care physician; Kandyce Rud,  MD   Note: This dictation was prepared with Dragon dictation along with smaller phrase technology. Any transcriptional errors that result from this process are unintentional.

## 2019-04-19 NOTE — ED Notes (Signed)
This RN spoke with Myrene Buddy to give report and was informed that they are currently doing rounds. Was asked to wait 30 mins before calling back for report.

## 2019-04-19 NOTE — ED Triage Notes (Signed)
Patient ambulatory to triage with steady gait, without difficulty or distress noted; pt reports that he awoke with dizziness; denies any accomp symptoms; denies hx of same

## 2019-04-19 NOTE — ED Provider Notes (Addendum)
[facility] Emergency Department Provider Note  ____________________________________________   First MD Initiated Contact with Patient 04/19/19 0700     (approximate)  I have reviewed the triage vital signs and the nursing notes.   HISTORY  Chief Complaint Dizziness  Level 5 caveat:  history/ROS limited by acute/critical illness  HPI Roger [name] is a 73 y.o. male with a history of atrial fibrillation and hypertension who says that he takes warfarin for the A. fib who presents for evaluation of dizziness.  He said that he felt little bit dizzy when he got up this morning.  The patient was placed in his room at about 6:40 AM and he was put on the monitor.  As I came out of another room, the cardiac monitor went off and indicated that he was in V. fib.  Several ED nurses and I ran into the room and verify that he was in fact in V. fib arrest.  See the hospital course for additional details.  After his successful resuscitation he confirmed that he has not had any recent chest pain or shortness of breath.  He denies fever, sore throat, cough, nausea, vomiting, and abdominal pain.  He just felt dizzy when he woke up this morning which is why he came in.  He has not had any syncopal episodes.  He has not had anything like this happen to him in the past.         Past Medical History:  Diagnosis Date  . Atrial fibrillation (HCC)   . Hypertension     There are no active problems to display for this patient.   History reviewed. No pertinent surgical history.  Prior to Admission medications   Medication Sig Start Date End Date Taking? Authorizing Provider  carbamide peroxide (DEBROX) 6.5 % otic solution Place 5 drops into the right ear 2 (two) times daily. 03/16/17   Joni Reining, PA-C  lisinopril (PRINIVIL,ZESTRIL) 10 MG tablet Take 10 mg by mouth daily.    [provider]  metoprolol tartrate (LOPRESSOR) 25 MG tablet Take 25 mg by mouth 2 (two)  times daily.    [provider]  ofloxacin (OCUFLOX) 0.3 % ophthalmic solution Place 1 drop into the left eye 4 (four) times daily. 11/25/16   Hagler, Jami L, PA-C  promethazine-dextromethorphan (PROMETHAZINE-DM) 6.25-15 MG/5ML syrup Take 5 mLs by mouth 4 (four) times daily as needed for cough. 11/25/16   Hagler, Jami L, PA-C  warfarin (COUMADIN) 1 MG tablet Take 1 mg by mouth daily.    [provider]    Allergies Amoxicillin and Sulfa antibiotics  No family history on file.  Social History Social History   Tobacco Use  . Smoking status: Current Every Day Smoker    Packs/day: 0.50    Types: Cigarettes  . Smokeless tobacco: Never Used  Substance Use Topics  . Alcohol use: Yes  . Drug use: No    Review of Systems Level 5 caveat:  history/ROS limited by acute/critical illness   Constitutional: No fever/chills Eyes: No visual changes. ENT: No sore throat. Cardiovascular: Prior to V. fib arrest, he denies having chest pain. Respiratory: Denies shortness of breath. Gastrointestinal: No abdominal pain.  No nausea, no vomiting.  No diarrhea.  No constipation. Genitourinary: Negative for dysuria. Musculoskeletal: Negative for neck pain.  Negative for back pain. Integumentary: Negative for rash. Neurological: Generalized dizziness.  Moving all 4 extremities.   ____________________________________________   PHYSICAL EXAM:  VITAL SIGNS: ED Triage Vitals  Enc Vitals Group     BP 04/19/19 0630 (!) 177/97     Pulse Rate 04/19/19 0630 90     Resp 04/19/19 0630 20     Temp 04/19/19 0630 (!) 97.3 F (36.3 C)     Temp Source 04/19/19 0630 Oral     SpO2 04/19/19 0630 99 %     Weight 04/19/19 0629 104.3 kg (230 lb)     Height 04/19/19 0629 1.727 m (5\' 8" )     Head Circumference --      Peak Flow --      Pain Score 04/19/19 0628 0     Pain Loc --      Pain Edu? --      Excl. in GC? --     Constitutional: Alert and oriented.  Appears uncomfortable but is in  no distress after arrest (see hospital course). Eyes: Conjunctivae are normal.  Head: Atraumatic. Nose: No congestion/rhinnorhea. Mouth/Throat: Mucous membranes are moist. Neck: No stridor.  No meningeal signs.   Cardiovascular: Normal rate, irregular rhythm. Good peripheral circulation. Grossly normal heart sounds. Respiratory: Normal respiratory effort.  No retractions. No audible wheezing. Gastrointestinal: Soft and nontender. No distention.  Musculoskeletal: No lower extremity tenderness nor edema. No gross deformities of extremities. Neurologic:  Normal speech and language. No gross focal neurologic deficits are appreciated.  Skin:  Skin is warm, dry and intact. No rash noted. Psychiatric: Mood and affect are normal. Speech and behavior are normal.  ____________________________________________   LABS (all labs ordered are listed, but only abnormal results are displayed)  Labs Reviewed  CBC WITH DIFFERENTIAL/PLATELET - Abnormal; Notable for the following components:      Result Value   WBC 13.7 (*)    Neutro Abs 10.8 (*)    Abs Immature Granulocytes 0.23 (*)    All other components within normal limits  MAGNESIUM - Abnormal; Notable for the following components:   Magnesium 4.0 (*)    All other components within normal limits  PROTIME-INR - Abnormal; Notable for the following components:   Prothrombin Time 25.9 (*)    INR 2.4 (*)    All other components within normal limits  APTT - Abnormal; Notable for the following components:   aPTT 41 (*)    All other components within normal limits  COMPREHENSIVE METABOLIC PANEL - Abnormal; Notable for the following components:   Glucose, Bld 184 (*)    Calcium 8.5 (*)    Total Protein 6.1 (*)    Albumin 3.4 (*)    Total Bilirubin 1.5 (*)    All other components within normal limits  SARS CORONAVIRUS 2 (HOSPITAL ORDER, PERFORMED IN [facility])  TROPONIN I  LIPASE, BLOOD    ____________________________________________  EKG  ED ECG REPORT I, Loleta Rose, the attending physician, personally viewed and interpreted this ECG.  Date: 04/19/2019 EKG Time: 6:43 AM Rate: 102 Rhythm: Atrial fibrillation QRS Axis: normal Intervals: Left bundle branch block ST/T Wave abnormalities: Non-specific ST segment / T-wave changes, but no clear evidence of acute ischemia. Narrative Interpretation: no definitive evidence of acute ischemia; does not meet STEMI criteria.   ____________________________________________  RADIOLOGY I, Loleta Rose, personally viewed and evaluated these images (plain radiographs) as part of my medical decision making, as well as reviewing the written report by the radiologist.  ED MD interpretation: Right lateral third rib fracture secondary to CPR.  No other acute abnormalities identified.  Official radiology report(s): Dg Chest Portable 1 View  Result Date: 04/19/2019  CLINICAL DATA:  73 year old male status post VFib arrest. Possible sternal fracture. EXAM: PORTABLE CHEST 1 VIEW COMPARISON:  11/25/2016 and earlier. FINDINGS: Portable AP semi upright view at 0707 hours. Pacer or resuscitation pads project over the chest. Low lung volumes and somewhat lordotic view. Mediastinal contours appear stable. Allowing for portable technique the lungs are clear. 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 There is a right lateral 3rd rib fracture. No other displaced rib fracture identified. Negative visible bowel gas pattern. IMPRESSION: 1. Right lateral 3rd rib fracture. No acute cardiopulmonary abnormality identified. 2. 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 Electronically Signed   By: Odessa Fleming M.D.   On: 04/19/2019 07:30    ____________________________________________   PROCEDURES   Procedure(s) performed (including Critical Care):  .Critical Care Performed by: Loleta Rose, MD Authorized by: Loleta Rose, MD   Critical care provider statement:    Critical care time (minutes):  30   Critical care time was exclusive of:  Separately billable procedures and treating other patients   Critical care was necessary to treat or prevent imminent or life-threatening deterioration of the following conditions:  Cardiac failure (CPR with chest compressions and electrical defibrillation and heparin)   Critical care was time spent personally by me on the following activities:  Development of treatment plan with patient or surrogate, discussions with consultants, evaluation of patient's response to treatment, examination of patient, obtaining history from patient or surrogate, ordering and performing treatments and interventions, ordering and review of laboratory studies, ordering and review of radiographic studies, pulse oximetry, re-evaluation of patient's condition and review of old charts     ____________________________________________   INITIAL IMPRESSION / MDM / ASSESSMENT AND PLAN / ED COURSE  As part of my medical decision making, I reviewed the following data within the electronic MEDICAL RECORD NUMBER Nursing notes reviewed and incorporated, Labs reviewed , EKG interpreted , Old EKG reviewed, Old chart reviewed, Radiograph reviewed , Discussed with admitting physician (Dr. Nicki Reaper) and Notes from prior ED visits      *Roger [name] was evaluated in Emergency Department on 04/19/2019 for the symptoms described in the history of present illness. He was evaluated in the context of the global COVID-19 pandemic, which necessitated consideration that the patient might be at risk for infection with the SARS-CoV-2 virus that causes COVID-19. Institutional protocols and algorithms that pertain to the evaluation of patients at risk for COVID-19 are in a state of rapid change based on information released by regulatory bodies including the CDC and federal and state organizations. These policies and algorithms  were followed during the patient's care in the ED.*  Differential diagnosis includes, but is not limited to, coronary artery disease/ACS, metabolic or electrolyte abnormality, acute infection.  We entered the room when he was in V. fib arrest.  Although he was on the monitor he did not have pacer pads in place since he had initially had no cardiac complaints.  I administered 2 precordial thumps while the nurses were preparing the code cart.  I defibrillated him at 200 J and he went completely unresponsive.  I personally began chest compressions while the nurses were establishing an IV and within about 20 seconds of chest compressions the patient was trying to push away the bag-valve-mask.  I stopped compressions and he had A. fib on the monitor and was starting to come around.  He was able to tell me his name.  I asked him if he was in  any pain and he said his chest hurt and feels "like somebody punched me".  He has been awake and alert and acting appropriate since that time.  As soon as an IV was established, which was right around the time that he regained consciousness, we administered amiodarone 150 mg IV and then started the amiodarone infusion.  I also ordered magnesium 2 g IV to be administered by slow push.  Labs are pending at this point.  COVID-19 swab was acquired and sent to the lab and is currently pending.  Although the patient goes to the [facility], I do not feel he is stable for transport given his spontaneous V. fib arrest.  I think that he needs to be stabilized and seen by cardiology at this facility rather than risk another code during transport.  I called and spoke at about 7:35 AM with Dr. Nicki Reaper with the hospitalist service.  I told him about the case and what we had done so far.  Dr. Lenard Lance will call and let him know when the patient's labs are back.      Clinical Course as of Apr 18 813  Wed Apr 19, 2019  5409 I discussed the case by phone with Dr. Darrold Junker with  cardiology.  He agreed with the plan to not transfer the patient to the [place] because he is too unstable.  He agreed that the patient does not need to go emergently to the Cath Lab.  He recommended starting heparin without a bolus given that the patient is already on warfarin.  He agreed with the plan for hospitalist admission and recommended an echocardiogram and cardiology consultation.  I updated Dr. Lenard Lance (in the ED) and Dr. Sherryll Burger  (with the hospitalist service).   [CF]  [phone] Labs are back and generally unremarkable including a normal troponin.  Magnesium is elevated likely because of the magnesium that I ordered just after the patient's arrest.  I informed Dr. Sherryll Burger that the patient is ready for admission.   [CF]    Clinical Course User Index [CF] Loleta Rose, MD     ____________________________________________  FINAL CLINICAL IMPRESSION(S) / ED DIAGNOSES  Final diagnoses:  Ventricular fibrillation (HCC)  Dizziness  Closed fracture of one rib of right side, initial encounter     MEDICATIONS GIVEN DURING THIS VISIT:  Medications  amiodarone (NEXTERONE PREMIX) 360-4.14 MG/200ML-% (1.8 mg/mL) IV infusion (60 mg/hr Intravenous New Bag/Given 04/19/19 0809)  amiodarone (NEXTERONE PREMIX) 360-4.14 MG/200ML-% (1.8 mg/mL) IV infusion (has no administration in time range)  aspirin chewable tablet 324 mg (324 mg Oral Given 04/19/19 1478)  amiodarone (NEXTERONE) IV bolus only 150 mg/100 mL (0 mg Intravenous Stopped 04/19/19 0727)  magnesium sulfate IVPB 2 g 50 mL (0 g Intravenous Stopped 04/19/19 0810)     ED Discharge Orders    None       Note:  This document was prepared using Dragon voice recognition software and may include unintentional dictation errors.   Loleta Rose, MD 04/19/19 0800    Loleta Rose, MD 04/19/19 [phone]

## 2019-04-19 NOTE — Consult Note (Signed)
Name: Roger [name] MRN: [id number] DOB: [birth year]    ADMISSION DATE:  04/19/2019 CONSULTATION DATE: 04/19/2019  REFERRING MD : Dr. Sherryll BurgerShah   CHIEF COMPLAINT: Dizziness   BRIEF PATIENT DESCRIPTION:  73 yo male admitted with dizziness and suffered brief V. Fib arrest in the ED post cardiac arrest rhythm atrial fibrillation now requiring amiodarone and heparin gtts   SIGNIFICANT EVENTS/STUDIES:  03/13-Pt admitted to the stepdown unit   HISTORY OF PRESENT ILLNESS:   This is a 73 yo male with a PMH of HTN and Chronic Atrial Fibrillation (on coumadin).  He presented to the ER on 05/13 with dizziness. In the ER pt went into ventricular fibrillation pt awake at that time.  ER physician performed 2 precordial thumps and defibrillated at 200J, pt became unresponsive.  Therefore, ACLS protocol initiated pt received chest compression for 20 seconds and became responsive/purposeful.  Cardiac rhythm post code atrial fibrillation, he received 150 mg iv amiodarone and amiodarone gtt initiated.  ER physician discussed case with Cardiologist Dr. Darrold JunkerParaschos no indication for emergent cardiac cath recommended heparin gtt without bolus.  Lab results unremarkable, CXR revealed right lateral 3rd rib fracture and chronic tracheal stenosis, but no acute cardiopulmonary abnormality.  [facility] however ER physician and cardiology determined pt too unstable for transfer.  He was subsequently [facility] for additional workup and treatment   PAST MEDICAL HISTORY :   has a past medical history of Atrial fibrillation (HCC) and Hypertension.  has no past surgical history on file. Prior to Admission medications   Medication Sig Start Date End Date Taking? Authorizing Provider  fluticasone (FLONASE) 50 MCG/ACT nasal spray Place 2 sprays into the nose daily. 04/08/19 04/07/20 Yes [provider]  lisinopril (PRINIVIL,ZESTRIL) 10 MG tablet Take 5 mg by mouth daily.    Yes  [provider]  metoprolol tartrate (LOPRESSOR) 25 MG tablet Take 25 mg by mouth 2 (two) times daily.   Yes [provider]   Allergies  Allergen Reactions  . Amoxicillin Anaphylaxis  . Sulfa Antibiotics Anaphylaxis  . Pravastatin Other (See Comments)  . Rosuvastatin Other (See Comments)    FAMILY HISTORY:  family history is not on file. SOCIAL HISTORY:  reports that he has been smoking cigarettes. He has been smoking about 0.50 packs per day. He has never used smokeless tobacco. He reports current alcohol use. He reports that he does not use drugs.  REVIEW OF SYSTEMS: Positives in BOLD  Constitutional: Negative for fever, chills, weight loss, malaise/fatigue and diaphoresis.  HENT: Negative for hearing loss, ear pain, nosebleeds, congestion, sore throat, neck pain, tinnitus and ear discharge.   Eyes: Negative for blurred vision, double vision, photophobia, pain, discharge and redness.  Respiratory: Negative for cough, hemoptysis, sputum production, shortness of breath, wheezing and stridor.   Cardiovascular: chest pain post CPR, palpitations, orthopnea, claudication, leg swelling and PND.  Gastrointestinal: Negative for heartburn, nausea, vomiting, abdominal pain, diarrhea, constipation, blood in stool and melena.  Genitourinary: Negative for dysuria, urgency, frequency, hematuria and flank pain.  Musculoskeletal: Negative for myalgias, back pain, joint pain and falls.  Skin: Negative for itching and rash.  Neurological: dizziness, tingling, tremors, sensory change, speech change, focal weakness, seizures, loss of consciousness, weakness and headaches.  Endo/Heme/Allergies: Negative for environmental allergies and polydipsia. Does not bruise/bleed easily.  SUBJECTIVE:  c/o chest soreness   VITAL SIGNS: Temp:  [97.3 F (36.3 C)-98.3 F (36.8 C)] 98.3 F (36.8 C) (05/13 1217)  Pulse Rate:  [40-90] 76 (05/13 1217) Resp:  [13-22] 17 (05/13 1217) BP:  (95-177)/(77-97) 148/80 (05/13 1217) SpO2:  [97 %-100 %] 100 % (05/13 1217) Weight:  [103.3 kg-104.3 kg] 103.3 kg (05/13 1217)  PHYSICAL EXAMINATION: General: well developed, well nourished male, NAD  Neuro: alert and oriented, follows commands  HEENT: supple, no JVD  Cardiovascular: irregular irregular, no R/G  Lungs: clear throughout, even, non labored  Abdomen: +BS x4, obese, soft, non tender, non distended  Musculoskeletal: normal bulk and tone, 1+ bilateral lower extremity edema  Skin: intact no rashes or lesions   Recent Labs  Lab 04/19/19 0735 04/19/19 1433  NA 140 139  K 3.9 4.8  CL 104 103  CO2 26 27  BUN 20 19  CREATININE 0.79 0.87  GLUCOSE 184* 126*   Recent Labs  Lab 04/19/19 0735  HGB 13.9  HCT 43.0  WBC 13.7*  PLT 292   Dg Chest Portable 1 View  Result Date: 04/19/2019 CLINICAL DATA:  73 year old male status post VFib arrest. Possible sternal fracture. EXAM: PORTABLE CHEST 1 VIEW COMPARISON:  11/25/2016 and earlier. FINDINGS: Portable AP semi upright view at 0707 hours. Pacer or resuscitation pads project over the chest. Low lung volumes and somewhat lordotic view. Mediastinal contours appear stable. Allowing for portable technique the lungs are clear. 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 There is a right lateral 3rd rib fracture. No other displaced rib fracture identified. Negative visible bowel gas pattern. IMPRESSION: 1. Right lateral 3rd rib fracture. No acute cardiopulmonary abnormality identified. 2. 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 Electronically Signed   By: Odessa Fleming M.D.   On: 04/19/2019 07:30    ASSESSMENT / PLAN:  Brief V. Fib arrest  Chronic atrial fibrillation  Hx: HTN Continuous telemetry monitoring  Trend troponin's  Continue aspirin, metoprolol and lisinopril; hold coumadin Continue amiodarone and heparin gtts  Trend CBC  Monitor for s/sx of bleeding and transfuse for hgb <8 Cardiology  consulted appreciate input-plans for cardiac catheterization in the am 04/20/2019   Sonda Rumble, Juel Burrow  Pulmonary/Critical Care Pager [phone] (please enter 7 digits) PCCM Consult Pager [phone] (please enter 7 digits)

## 2019-04-19 NOTE — ED Notes (Signed)
ED TO INPATIENT HANDOFF REPORT  ED Nurse Name and Phone #:  Selena Batten [phone]  S Name/Age/Gender Roger [name] 73 y.o. male Room/Bed: ED15A/ED15A  Code Status   Code Status: Not on file  Home/SNF/Other Home Patient oriented to: self, place, time and situation Is this baseline? Yes   Triage Complete: Triage complete  Chief Complaint Dizziness  Triage Note Patient ambulatory to triage with steady gait, without difficulty or distress noted; pt reports that he awoke with dizziness; denies any accomp symptoms; denies hx of same   Allergies Allergies  Allergen Reactions  . Amoxicillin Anaphylaxis  . Sulfa Antibiotics Anaphylaxis  . Pravastatin Other (See Comments)  . Rosuvastatin Other (See Comments)    Level of Care/Admitting Diagnosis ED Disposition    ED Disposition Condition Comment   Admit  Hospital Area: [facility] [[id number]]  Level of Care: Stepdown [14]  Covid Evaluation: N/A  Diagnosis: Ventricular fibrillation (HCC) [427.41.ICD-9-CM]  Admitting Physician: Delfino Lovett [[id number]]  Attending Physician: Delfino Lovett [[id number]]  Estimated length of stay: past midnight tomorrow  Certification:: I certify this patient will need inpatient services for at least 2 midnights  PT Class (Do Not Modify): Inpatient [101]  PT Acc Code (Do Not Modify): Private [1]       B Medical/Surgery History Past Medical History:  Diagnosis Date  . Atrial fibrillation (HCC)   . Hypertension    History reviewed. No pertinent surgical history.   A IV Location/Drains/Wounds Patient Lines/Drains/Airways Status   Active Line/Drains/Airways    Name:   Placement date:   Placement time:   Site:   Days:   Peripheral IV 04/19/19 Right Hand   04/19/19    0658    Hand   less than 1   Peripheral IV 04/19/19 Left Antecubital   04/19/19    1856    Antecubital   less than 1          Intake/Output Last 24 hours No intake or output data in the 24 hours ending 04/19/19  0941  Labs/Imaging Results for orders placed or performed during the hospital encounter of 04/19/19 (from the past 48 hour(s))  SARS Coronavirus 2 (CEPHEID - Performed in [facility] hospital lab), Hosp Order     Status: None   Collection Time: 04/19/19  7:01 AM  Result Value Ref Range   SARS Coronavirus 2 NEGATIVE NEGATIVE    Comment: (NOTE) If result is NEGATIVE SARS-CoV-2 target nucleic acids are NOT DETECTED. The SARS-CoV-2 RNA is generally detectable in upper and lower  respiratory specimens during the acute phase of infection. The lowest  concentration of SARS-CoV-2 viral copies this assay can detect is 250  copies / mL. A negative result does not preclude SARS-CoV-2 infection  and should not be used as the sole basis for treatment or other  patient management decisions.  A negative result may occur with  improper specimen collection / handling, submission of specimen other  than nasopharyngeal swab, presence of viral mutation(s) within the  areas targeted by this assay, and inadequate number of viral copies  (<250 copies / mL). A negative result must be combined with clinical  observations, patient history, and epidemiological information. If result is POSITIVE SARS-CoV-2 target nucleic acids are DETECTED. The SARS-CoV-2 RNA is generally detectable in upper and lower  respiratory specimens dur ing the acute phase of infection.  Positive  results are indicative of active infection with SARS-CoV-2.  Clinical  correlation with patient history and other diagnostic information is  necessary to determine patient infection status.  Positive results do  not rule out bacterial infection or co-infection with other viruses. If result is PRESUMPTIVE POSTIVE SARS-CoV-2 nucleic acids MAY BE PRESENT.   A presumptive positive result was obtained on the submitted specimen  and confirmed on repeat testing.  While 2019 novel coronavirus  (SARS-CoV-2) nucleic acids may be present in the  submitted sample  additional confirmatory testing may be necessary for epidemiological  and / or clinical management purposes  to differentiate between  SARS-CoV-2 and other Sarbecovirus currently known to infect humans.  If clinically indicated additional testing with an alternate test  methodology [phone]([id number]) is advised. The SARS-CoV-2 RNA is generally  detectable in upper and lower respiratory sp ecimens during the acute  phase of infection. The expected result is Negative. Fact Sheet for Patients:  BoilerBrush.com.cyhttps://www.fda.gov/media/136312/download Fact Sheet for Healthcare Providers: https://pope.com/https://www.fda.gov/media/136313/download This test is not yet approved or cleared by the [place] FDA and has been authorized for detection and/or diagnosis of SARS-CoV-2 by FDA under an Emergency Use Authorization (EUA).  This EUA will remain in effect (meaning this test can be used) for the duration of the COVID-19 declaration under Section 564(b)(1) of the Act, 21 U.S.C. section 360bbb-3(b)(1), unless the authorization is terminated or revoked sooner. Performed at [facility], [address] [place], [place] [address]   CBC with Differential/Platelet     Status: Abnormal   Collection Time: 04/19/19  7:35 AM  Result Value Ref Range   WBC 13.7 (H) 4.0 - 10.5 K/uL   RBC 4.52 4.22 - 5.81 MIL/uL   Hemoglobin 13.9 13.0 - 17.0 g/dL   HCT 81.143.0 91.439.0 - 78.252.0 %   MCV 95.1 80.0 - 100.0 fL   MCH 30.8 26.0 - 34.0 pg   MCHC 32.3 30.0 - 36.0 g/dL   RDW 95.614.9 21.311.5 - 08.615.5 %   Platelets 292 150 - 400 K/uL   nRBC 0.0 0.0 - 0.2 %   Neutrophils Relative % 79 %   Neutro Abs 10.8 (H) 1.7 - 7.7 K/uL   Lymphocytes Relative 12 %   Lymphs Abs 1.7 0.7 - 4.0 K/uL   Monocytes Relative 5 %   Monocytes Absolute 0.7 0.1 - 1.0 K/uL   Eosinophils Relative 1 %   Eosinophils Absolute 0.1 0.0 - 0.5 K/uL   Basophils Relative 1 %   Basophils Absolute 0.1 0.0 - 0.1 K/uL   Immature Granulocytes 2 %   Abs Immature  Granulocytes 0.23 (H) 0.00 - 0.07 K/uL    Comment: Performed at [facility], [address] [place], [place] [address]  Troponin I - ONCE - STAT     Status: None   Collection Time: 04/19/19  7:35 AM  Result Value Ref Range   Troponin I <0.03 <0.03 ng/mL    Comment: Performed at [facility], [address] [place], [place] [address]  Magnesium     Status: Abnormal   Collection Time: 04/19/19  7:35 AM  Result Value Ref Range   Magnesium 4.0 (H) 1.7 - 2.4 mg/dL    Comment: Performed at [facility], [address] [place], [place] [address]  Protime-INR     Status: Abnormal   Collection Time: 04/19/19  7:35 AM  Result Value Ref Range   Prothrombin Time 25.9 (H) 11.4 - 15.2 seconds   INR 2.4 (H) 0.8 - 1.2    Comment: (NOTE) INR goal varies based on device and disease states. Performed at [facility],  [address] [place], [place] [address]   APTT     Status: Abnormal   Collection Time: 04/19/19  7:35 AM  Result Value Ref Range   aPTT 41 (H) 24 - 36 seconds    Comment:        IF BASELINE aPTT IS ELEVATED, SUGGEST PATIENT RISK ASSESSMENT BE USED TO DETERMINE APPROPRIATE ANTICOAGULANT THERAPY. Performed at [facility], [address] [place], [place] [address]   Comprehensive metabolic panel     Status: Abnormal   Collection Time: 04/19/19  7:35 AM  Result Value Ref Range   Sodium 140 135 - 145 mmol/L   Potassium 3.9 3.5 - 5.1 mmol/L   Chloride 104 98 - 111 mmol/L   CO2 26 22 - 32 mmol/L   Glucose, Bld 184 (H) 70 - 99 mg/dL   BUN 20 8 - 23 mg/dL   Creatinine, Ser 8.29 0.61 - 1.24 mg/dL   Calcium 8.5 (L) 8.9 - 10.3 mg/dL   Total Protein 6.1 (L) 6.5 - 8.1 g/dL   Albumin 3.4 (L) 3.5 - 5.0 g/dL   AST 17 15 - 41 U/L   ALT 13 0 - 44 U/L   Alkaline Phosphatase 66 38 - 126 U/L   Total Bilirubin 1.5 (H) 0.3 - 1.2 mg/dL   GFR calc non Af Amer >60 >60 mL/min   GFR calc Af Amer >60 >60 mL/min   Anion gap 10 5 - 15     Comment: Performed at [facility], [address] [place], [place] [address]  Lipase, blood     Status: None   Collection Time: 04/19/19  7:35 AM  Result Value Ref Range   Lipase 25 11 - 51 U/L    Comment: Performed at [facility], [address], [place], [place] [address]   Dg Chest Portable 1 View  Result Date: 04/19/2019 CLINICAL DATA:  73 year old male status post VFib arrest. Possible sternal fracture. EXAM: PORTABLE CHEST 1 VIEW COMPARISON:  11/25/2016 and earlier. FINDINGS: Portable AP semi upright view at 0707 hours. Pacer or resuscitation pads project over the chest. Low lung volumes and somewhat lordotic view. Mediastinal contours appear stable. Allowing for portable technique the lungs are clear. 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 There is a right lateral 3rd rib fracture. No other displaced rib fracture identified. Negative visible bowel gas pattern. IMPRESSION: 1. Right lateral 3rd rib fracture. No acute cardiopulmonary abnormality identified. 2. 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 Electronically Signed   By: Odessa Fleming M.D.   On: 04/19/2019 07:30    Pending Labs Unresulted Labs (From admission, onward)    Start     Ordered   Signed and Held  HIV antibody (Routine Testing)  Once,   R     Signed and Held          Vitals/Pain Today's Vitals   04/19/19 0721 04/19/19 0730 04/19/19 0849 04/19/19 0852  BP:  95/81 119/81   Pulse:  (!) 50 (!) 56   Resp:  (!) 21 16   Temp:      TempSrc:      SpO2:  99% 100%   Weight:      Height:      PainSc: 5    5     Isolation Precautions No active isolations  Medications Medications  amiodarone (NEXTERONE PREMIX) 360-4.14 MG/200ML-% (1.8 mg/mL) IV infusion (60 mg/hr Intravenous New Bag/Given 04/19/19 0809)  amiodarone (NEXTERONE PREMIX) 360-4.14  MG/200ML-% (1.8 mg/mL) IV infusion (has no administration in time range)  heparin ADULT infusion 100 units/mL (25000  units/27mL sodium chloride 0.45%) (1,050 Units/hr Intravenous New Bag/Given 04/19/19 0852)  phytonadione (VITAMIN K) tablet 10 mg (has no administration in time range)  aspirin chewable tablet 324 mg (324 mg Oral Given 04/19/19 1610)  amiodarone (NEXTERONE) IV bolus only 150 mg/100 mL (0 mg Intravenous Stopped 04/19/19 0727)  magnesium sulfate IVPB 2 g 50 mL (0 g Intravenous Stopped 04/19/19 0810)    Mobility walks Low fall risk   Focused Assessments Cardiac Assessment Handoff:    Lab Results  Component Value Date   TROPONINI <0.03 04/19/2019   No results found for: DDIMER Does the Patient currently have chest pain? No     R Recommendations: See Admitting Provider Note  Report given to:   Additional Notes:

## 2019-04-19 NOTE — Consult Note (Signed)
ANTICOAGULATION CONSULT NOTE   Pharmacy Consult for Heparin Dosing  Indication: NSTEMI   Allergies  Allergen Reactions  . Amoxicillin Anaphylaxis  . Sulfa Antibiotics Anaphylaxis  . Pravastatin Other (See Comments)  . Rosuvastatin Other (See Comments)    Patient Measurements: Height: 5\' 8"  (172.7 cm) Weight: 227 lb 11.8 oz (103.3 kg) IBW/kg (Calculated) : 68.4 Heparin Dosing Weight: 91.1 kg   Vital Signs: Temp: 98.2 F (36.8 C) (05/13 1630) Temp Source: Oral (05/13 1630) BP: 129/71 (05/13 1700) Pulse Rate: 81 (05/13 1700)  Labs: Recent Labs    04/19/19 0735 04/19/19 1433 04/19/19 1647  HGB 13.9  --   --   HCT 43.0  --   --   PLT 292  --   --   APTT 41*  --   --   LABPROT 25.9* 26.6*  --   INR 2.4* 2.5*  --   HEPARINUNFRC  --   --  0.36  CREATININE 0.79 0.87  --   TROPONINI <0.03  --   --     Estimated Creatinine Clearance: 92.1 mL/min (by C-G formula based on SCr of 0.87 mg/dL).  Tropinin <0.03  Medications:  Warfarin 1 mg - last taken on 04/18/2019 @ ~9:30 pm (per patient report when I called the room)  Assessment: Baseline labs have been obtained for this patient. Patient has a h/o afib. He does not meet the STEMI criteria.  Given his INR is therapeutic, patient does not need a bolus dose.  Heparin Course: 5/13 1647 HL 0.36  Goal of Therapy:  Heparin level 0.3-0.7 units/ml    Plan:  5/13 1700 HL of 0.36 is therapeutic. Will check next Heparin level in 8 hours for confirmation. Daily CBC while on Heparin drip.  Clovia Cuff, PharmD, BCPS 04/19/2019 5:34 PM

## 2019-04-20 ENCOUNTER — Encounter
Admission: EM | Disposition: A | Discharge status: Home Health | Payer: Self-pay | Source: Home / Self Care | Attending: Internal Medicine

## 2019-04-20 ENCOUNTER — Encounter: Payer: Self-pay | Admitting: Cardiovascular Disease

## 2019-04-20 HISTORY — PX: LEFT HEART CATH AND CORONARY ANGIOGRAPHY: CATH118249

## 2019-04-20 HISTORY — PX: CORONARY STENT INTERVENTION: CATH118234

## 2019-04-20 LAB — BASIC METABOLIC PANEL
Anion gap: 8 (ref 5–15)
Anion gap: 9 (ref 5–15)
BUN: 18 mg/dL (ref 8–23)
BUN: 17 mg/dL (ref 8–23)
CO2: 25 mmol/L (ref 22–32)
CO2: 24 mmol/L (ref 22–32)
Calcium: 8.2 mg/dL — ABNORMAL LOW (ref 8.9–10.3)
Calcium: 8.2 mg/dL — ABNORMAL LOW (ref 8.9–10.3)
Chloride: 105 mmol/L (ref 98–111)
Chloride: 103 mmol/L (ref 98–111)
Creatinine, Ser: 0.77 mg/dL (ref 0.61–1.24)
Creatinine, Ser: 0.82 mg/dL (ref 0.61–1.24)
GFR calc Af Amer: >60 mL/min (ref >60)
GFR calc Af Amer: >60 mL/min (ref >60)
GFR calc non Af Amer: >60 mL/min (ref >60)
GFR calc non Af Amer: >60 mL/min (ref >60)
Glucose, Bld: 123 mg/dL — ABNORMAL HIGH (ref 70–99)
Glucose, Bld: 106 mg/dL — ABNORMAL HIGH (ref 70–99)
Potassium: 4.5 mmol/L (ref 3.5–5.1)
Potassium: 4.6 mmol/L (ref 3.5–5.1)
Sodium: 138 mmol/L (ref 135–145)
Sodium: 136 mmol/L (ref 135–145)

## 2019-04-20 LAB — CBC
HCT: 40.5 % (ref 39.0–52.0)
HCT: 39.5 % (ref 39.0–52.0)
Hemoglobin: 12.9 g/dL — ABNORMAL LOW (ref 13.0–17.0)
Hemoglobin: 12.6 g/dL — ABNORMAL LOW (ref 13.0–17.0)
MCH: 30.1 pg (ref 26.0–34.0)
MCH: 30.3 pg (ref 26.0–34.0)
MCHC: 31.9 g/dL (ref 30.0–36.0)
MCHC: 31.9 g/dL (ref 30.0–36.0)
MCV: 94.6 fL (ref 80.0–100.0)
MCV: 95 fL (ref 80.0–100.0)
Platelets: 220 10*3/uL (ref 150–400)
Platelets: 214 10*3/uL (ref 150–400)
RBC: 4.28 MIL/uL (ref 4.22–5.81)
RBC: 4.16 MIL/uL — ABNORMAL LOW (ref 4.22–5.81)
RDW: 14.9 % (ref 11.5–15.5)
RDW: 14.6 % (ref 11.5–15.5)
WBC: 12.4 10*3/uL — ABNORMAL HIGH (ref 4.0–10.5)
WBC: 14.6 10*3/uL — ABNORMAL HIGH (ref 4.0–10.5)
nRBC: 0 % (ref 0.0–0.2)
nRBC: 0 % (ref 0.0–0.2)

## 2019-04-20 LAB — ECHOCARDIOGRAM COMPLETE
Height: 68 in
Weight: 3643.76 oz

## 2019-04-20 LAB — HEPARIN LEVEL (UNFRACTIONATED)
Heparin Unfractionated: 0.47 IU/mL (ref 0.30–0.70)
Heparin Unfractionated: 0.44 IU/mL (ref 0.30–0.70)

## 2019-04-20 LAB — PROTIME-INR
INR: 1.6 — ABNORMAL HIGH (ref 0.8–1.2)
Prothrombin Time: 18.4 seconds — ABNORMAL HIGH (ref 11.4–15.2)

## 2019-04-20 LAB — HIV ANTIBODY (ROUTINE TESTING W REFLEX): HIV Screen 4th Generation wRfx: NONREACTIVE

## 2019-04-20 LAB — POCT ACTIVATED CLOTTING TIME: Activated Clotting Time: 505 seconds

## 2019-04-20 SURGERY — LEFT HEART CATH AND CORONARY ANGIOGRAPHY
Anesthesia: Moderate Sedation

## 2019-04-20 MED ORDER — SODIUM CHLORIDE 0.9 % WEIGHT BASED INFUSION: 1.0000 mL/kg/h | INTRAVENOUS | Status: DC

## 2019-04-20 MED ORDER — ACETAMINOPHEN 325 MG PO TABS: 650.0000 mg | ORAL_TABLET | ORAL | Status: DC | PRN

## 2019-04-20 MED ORDER — SODIUM CHLORIDE 0.9 % IV SOLN
INTRAVENOUS | Status: AC | PRN
  Administered 2019-04-20: 1.75 mg/kg/h via INTRAVENOUS

## 2019-04-20 MED ORDER — HEPARIN (PORCINE) IN NACL 1000-0.9 UT/500ML-% IV SOLN
INTRAVENOUS | Status: DC | PRN
  Administered 2019-04-20: 500 mL

## 2019-04-20 MED ORDER — LABETALOL HCL 5 MG/ML IV SOLN: 10.0000 mg | INTRAVENOUS | Status: DC | PRN

## 2019-04-20 MED ORDER — TICAGRELOR 90 MG PO TABS
ORAL_TABLET | ORAL | Status: DC | PRN
  Administered 2019-04-20: 180 mg via ORAL

## 2019-04-20 MED ORDER — LABETALOL HCL 5 MG/ML IV SOLN: 10.0000 mg | INTRAVENOUS | Status: AC | PRN

## 2019-04-20 MED ORDER — ASPIRIN 81 MG PO CHEW: 81.0000 mg | CHEWABLE_TABLET | ORAL | Status: DC

## 2019-04-20 MED ORDER — GUAIFENESIN ER 600 MG PO TB12
600.0000 mg | ORAL_TABLET | Freq: Every day | ORAL | Status: DC
  Administered 2019-04-21: 600 mg via ORAL
  Filled 2019-04-20: qty 1

## 2019-04-20 MED ORDER — TICAGRELOR 90 MG PO TABS
90.0000 mg | ORAL_TABLET | Freq: Two times a day (BID) | ORAL | Status: DC
  Administered 2019-04-20 – 2019-04-21 (×2): 90 mg via ORAL
  Filled 2019-04-20 (×3): qty 1

## 2019-04-20 MED ORDER — SODIUM CHLORIDE 0.9% FLUSH: 3.0000 mL | INTRAVENOUS | Status: DC | PRN

## 2019-04-20 MED ORDER — SODIUM CHLORIDE 0.9 % IV SOLN: 250.0000 mL | INTRAVENOUS | Status: DC | PRN

## 2019-04-20 MED ORDER — MIDAZOLAM HCL 2 MG/2ML IJ SOLN
INTRAMUSCULAR | Status: AC
  Filled 2019-04-20: qty 2

## 2019-04-20 MED ORDER — SODIUM CHLORIDE 0.9 % WEIGHT BASED INFUSION: 3.0000 mL/kg/h | INTRAVENOUS | Status: DC

## 2019-04-20 MED ORDER — FENTANYL CITRATE (PF) 100 MCG/2ML IJ SOLN
INTRAMUSCULAR | Status: AC
  Filled 2019-04-20: qty 2

## 2019-04-20 MED ORDER — ONDANSETRON HCL 4 MG/2ML IJ SOLN: 4.0000 mg | Freq: Four times a day (QID) | INTRAMUSCULAR | Status: DC | PRN

## 2019-04-20 MED ORDER — SODIUM CHLORIDE 0.9% FLUSH: 3.0000 mL | Freq: Two times a day (BID) | INTRAVENOUS | Status: DC

## 2019-04-20 MED ORDER — FLUTICASONE PROPIONATE 50 MCG/ACT NA SUSP
2.0000 | Freq: Every day | NASAL | Status: DC
  Administered 2019-04-20 – 2019-04-21 (×2): 2 via NASAL
  Filled 2019-04-20: qty 16

## 2019-04-20 MED ORDER — SODIUM CHLORIDE 0.9% FLUSH
3.0000 mL | Freq: Two times a day (BID) | INTRAVENOUS | Status: DC
  Administered 2019-04-20 – 2019-04-21 (×2): 3 mL via INTRAVENOUS

## 2019-04-20 MED ORDER — TICAGRELOR 90 MG PO TABS
ORAL_TABLET | ORAL | Status: AC
  Filled 2019-04-20: qty 2

## 2019-04-20 MED ORDER — IOHEXOL 300 MG/ML  SOLN
INTRAMUSCULAR | Status: DC | PRN
  Administered 2019-04-20: 80 mL via INTRA_ARTERIAL

## 2019-04-20 MED ORDER — VERAPAMIL HCL 2.5 MG/ML IV SOLN
INTRAVENOUS | Status: AC
  Filled 2019-04-20: qty 2

## 2019-04-20 MED ORDER — ASPIRIN 81 MG PO CHEW
CHEWABLE_TABLET | ORAL | Status: AC
  Filled 2019-04-20: qty 3

## 2019-04-20 MED ORDER — HEPARIN SODIUM (PORCINE) 1000 UNIT/ML IJ SOLN
INTRAMUSCULAR | Status: AC
  Filled 2019-04-20: qty 1

## 2019-04-20 MED ORDER — ASPIRIN 81 MG PO CHEW
CHEWABLE_TABLET | ORAL | Status: DC | PRN
  Administered 2019-04-20: 243 mg via ORAL

## 2019-04-20 MED ORDER — SODIUM CHLORIDE 0.9 % IV SOLN
0.2500 mg/kg/h | INTRAVENOUS | Status: AC
  Filled 2019-04-20: qty 250

## 2019-04-20 MED ORDER — FENTANYL CITRATE (PF) 100 MCG/2ML IJ SOLN
INTRAMUSCULAR | Status: DC | PRN
  Administered 2019-04-20: 50 ug via INTRAVENOUS
  Administered 2019-04-20 (×2): 25 ug via INTRAVENOUS

## 2019-04-20 MED ORDER — SODIUM CHLORIDE 0.9 % WEIGHT BASED INFUSION: 1.0000 mL/kg/h | INTRAVENOUS | Status: AC

## 2019-04-20 MED ORDER — HEPARIN (PORCINE) IN NACL 1000-0.9 UT/500ML-% IV SOLN
INTRAVENOUS | Status: AC
  Filled 2019-04-20: qty 1000

## 2019-04-20 MED ORDER — HYDRALAZINE HCL 20 MG/ML IJ SOLN: 10.0000 mg | INTRAMUSCULAR | Status: DC | PRN

## 2019-04-20 MED ORDER — BENZONATATE 100 MG PO CAPS
100.0000 mg | ORAL_CAPSULE | Freq: Three times a day (TID) | ORAL | Status: DC | PRN
  Administered 2019-04-20 – 2019-04-21 (×3): 100 mg via ORAL
  Filled 2019-04-20 (×3): qty 1

## 2019-04-20 MED ORDER — IOHEXOL 300 MG/ML  SOLN
INTRAMUSCULAR | Status: DC | PRN
  Administered 2019-04-20: 160 mL via INTRA_ARTERIAL

## 2019-04-20 MED ORDER — ASPIRIN 81 MG PO CHEW
81.0000 mg | CHEWABLE_TABLET | Freq: Every day | ORAL | Status: DC
  Administered 2019-04-21: 81 mg via ORAL
  Filled 2019-04-20 (×2): qty 1

## 2019-04-20 MED ORDER — BIVALIRUDIN TRIFLUOROACETATE 250 MG IV SOLR
INTRAVENOUS | Status: AC
  Filled 2019-04-20: qty 250

## 2019-04-20 MED ORDER — BIVALIRUDIN BOLUS VIA INFUSION - CUPID
INTRAVENOUS | Status: DC | PRN
  Administered 2019-04-20: 77.475 mg via INTRAVENOUS

## 2019-04-20 MED ORDER — MIDAZOLAM HCL 2 MG/2ML IJ SOLN
INTRAMUSCULAR | Status: DC | PRN
  Administered 2019-04-20 (×2): 0.5 mg via INTRAVENOUS
  Administered 2019-04-20: 1 mg via INTRAVENOUS

## 2019-04-20 MED ORDER — HYDRALAZINE HCL 20 MG/ML IJ SOLN: 10.0000 mg | INTRAMUSCULAR | Status: AC | PRN

## 2019-04-20 MED ORDER — NITROGLYCERIN 1 MG/10 ML FOR IR/CATH LAB
INTRA_ARTERIAL | Status: AC
  Filled 2019-04-20: qty 10

## 2019-04-20 SURGICAL SUPPLY — 23 items
BALLN MINITREK RX 2.0X12 (BALLOONS) ×4
BALLOON MINITREK RX 2.0X12 (BALLOONS) ×2 IMPLANT
CATH INFINITI 5 FR 3DRC (CATHETERS) ×8 IMPLANT
CATH INFINITI 5FR ANG PIGTAIL (CATHETERS) ×4 IMPLANT
CATH INFINITI 5FR JL4 (CATHETERS) ×4 IMPLANT
CATH INFINITI 5FR TG (CATHETERS) ×4 IMPLANT
CATH INFINITI JR4 5F (CATHETERS) ×4 IMPLANT
CATH VISTA GUIDE 6FR XB3.5 SH (CATHETERS) ×4 IMPLANT
DEVICE CLOSURE MYNXGRIP 6/7F (Vascular Products) ×4 IMPLANT
DEVICE INFLAT 30 PLUS (MISCELLANEOUS) ×4 IMPLANT
DEVICE RAD TR BAND REGULAR (VASCULAR PRODUCTS) ×4 IMPLANT
DEVICE SAFEGUARD 24CM (GAUZE/BANDAGES/DRESSINGS) ×4 IMPLANT
GLIDESHEATH SLEND A-KIT 6F 22G (SHEATH) ×4 IMPLANT
GLIDESHEATH SLEND SS 6F .021 (SHEATH) ×4 IMPLANT
KIT MANI 3VAL PERCEP (MISCELLANEOUS) ×4 IMPLANT
NEEDLE PERC 18GX7CM (NEEDLE) ×4 IMPLANT
PACK CARDIAC CATH (CUSTOM PROCEDURE TRAY) ×4 IMPLANT
SHEATH AVANTI 5FR X 11CM (SHEATH) ×4 IMPLANT
SHEATH AVANTI 6FR X 11CM (SHEATH) ×4 IMPLANT
STENT RESOLUTE ONYX 2.0X12 (Permanent Stent) ×4 IMPLANT
WIRE G HI TQ BMW 190 (WIRE) ×4 IMPLANT
WIRE GUIDERIGHT .035X150 (WIRE) ×4 IMPLANT
WIRE NITINOL .018 (WIRE) ×8 IMPLANT

## 2019-04-20 NOTE — Progress Notes (Signed)
SUBJECTIVE: Patient denies any chest pain or shortness of breath   Vitals:   04/20/19 0600 04/20/19 0700 04/20/19 0800 04/20/19 0918  BP: (!) 159/81 (!) 143/92 (!) 143/87 (!) 151/97  Pulse: 95 87 78   Resp: (!) 23 (!) 21 18 (!) 23  Temp:      TempSrc:      SpO2: 100% 100% 100%   Weight:      Height:        Intake/Output Summary (Last 24 hours) at 04/20/2019 0943 Last data filed at 04/20/2019 0600 Gross per 24 hour  Intake 1291.14 ml  Output 1200 ml  Net 91.14 ml    LABS: Basic Metabolic Panel: Recent Labs    04/19/19 0735 04/19/19 1433 04/20/19 0517  NA 140 139 138  K 3.9 4.8 4.5  CL 104 103 105  CO2 26 27 25   GLUCOSE 184* 126* 123*  BUN 20 19 18   CREATININE 0.79 0.87 0.77  CALCIUM 8.5* 8.5* 8.2*  MG 4.0*  --   --    Liver Function Tests: Recent Labs    04/19/19 0735  AST 17  ALT 13  ALKPHOS 66  BILITOT 1.5*  PROT 6.1*  ALBUMIN 3.4*   Recent Labs    04/19/19 0735  LIPASE 25   CBC: Recent Labs    04/19/19 0735 04/20/19 0517  WBC 13.7* 12.4*  NEUTROABS 10.8*  --   HGB 13.9 12.9*  HCT 43.0 40.5  MCV 95.1 94.6  PLT 292 220   Cardiac Enzymes: Recent Labs    04/19/19 0735  TROPONINI <0.03   BNP: Invalid input(s): POCBNP D-Dimer: No results for input(s): DDIMER in the last 72 hours. Hemoglobin A1C: No results for input(s): HGBA1C in the last 72 hours. Fasting Lipid Panel: No results for input(s): CHOL, HDL, LDLCALC, TRIG, CHOLHDL, LDLDIRECT in the last 72 hours. Thyroid Function Tests: No results for input(s): TSH, T4TOTAL, T3FREE, THYROIDAB in the last 72 hours.  Invalid input(s): FREET3 Anemia Panel: No results for input(s): VITAMINB12, FOLATE, FERRITIN, TIBC, IRON, RETICCTPCT in the last 72 hours.   PHYSICAL EXAM General: Well developed, well nourished, in no acute distress HEENT:  Normocephalic and atramatic Neck:  No JVD.  Lungs: Clear bilaterally to auscultation and percussion. Heart: HRRR . Normal S1 and S2 without gallops  or murmurs.  Abdomen: Bowel sounds are positive, abdomen soft and non-tender  Msk:  Back normal, normal gait. Normal strength and tone for age. Extremities: No clubbing, cyanosis or edema.   Neuro: Alert and oriented X 3. Psych:  Good affect, responds appropriately  TELEMETRY: Sinus rhythm  ASSESSMENT AND PLAN.  Patient is status post cardiac arrest and has a history of atrial fibrillation with INR being therapeutic when he came in and all the labs including troponins were unremarkable after the cardiac arrest.  Patient denies any chest pain or prior history of coronary artery disease but does have a history of atrial fibrillation.  Patient will undergo cardiac catheterization for which risk and benefits were explained to the patient and then may need ICD and be transferred to [facility].  EKG shows atrial fibrillation with left bundle branch block.  Patient is about to have cardiac catheterization.  An INR now is 1.6  Active Problems:   Ventricular fibrillation (HCC)    Laurier Nancy, MD, [facility] 04/20/2019 9:43 AM

## 2019-04-20 NOTE — Progress Notes (Signed)
Patient is in no distress on [place] O2.  Exam unremarkable except (chronic) symmetric ankle and pedal edema.  He is for left heart cath today.  If no interventions are made, he may be transferred to telemetry (2A).  If transferred, PCCM will sign off. Please call if we can be of further assistance    Billy Fischer, MD PCCM service Mobile [phone] Pager [phone] 04/20/2019 11:40 AM

## 2019-04-20 NOTE — Consult Note (Signed)
ANTICOAGULATION CONSULT NOTE   Pharmacy Consult for Heparin Dosing  Indication: NSTEMI   Allergies  Allergen Reactions  . Amoxicillin Anaphylaxis  . Sulfa Antibiotics Anaphylaxis  . Pravastatin Other (See Comments)  . Rosuvastatin Other (See Comments)    Patient Measurements: Height: 5\' 8"  (172.7 cm) Weight: 227 lb 11.8 oz (103.3 kg) IBW/kg (Calculated) : 68.4 Heparin Dosing Weight: 91.1 kg   Vital Signs: Temp: 98.2 F (36.8 C) (05/14 0400) Temp Source: Oral (05/14 0400) BP: 152/87 (05/14 0500) Pulse Rate: 82 (05/14 0500)  Labs: Recent Labs    04/19/19 0735 04/19/19 1433 04/19/19 1647 04/20/19 0052 04/20/19 0517  HGB 13.9  --   --   --  12.9*  HCT 43.0  --   --   --  40.5  PLT 292  --   --   --  220  APTT 41*  --   --   --   --   LABPROT 25.9* 26.6*  --   --   --   INR 2.4* 2.5*  --   --   --   HEPARINUNFRC  --   --  0.36 0.47 0.44  CREATININE 0.79 0.87  --   --  0.77  TROPONINI <0.03  --   --   --   --     Estimated Creatinine Clearance: 100.1 mL/min (by C-G formula based on SCr of 0.77 mg/dL).  Tropinin <0.03  Medications:  Warfarin 1 mg - last taken on 04/18/2019 @ ~9:30 pm (per patient report when I called the room)  Assessment: Baseline labs have been obtained for this patient. Patient has a h/o afib. He does not meet the STEMI criteria.  Given his INR is therapeutic, patient does not need a bolus dose.  Heparin Course: 5/13 1647 HL 0.36 5/14 0052 HL 0.47 5/14 0517 HL 0.44  Goal of Therapy:  Heparin level 0.3-0.7 units/ml  Plan:  5/14 @ 0517  HL 0.44. Level remains therapeutic. Will continue current infusion rate of 1050 units/hr. Will continue to check HL and CBCs daily while on heparin infusion per protocol.   Gardner Candle, PharmD, BCPS Clinical Pharmacist 04/20/2019 5:42 AM

## 2019-04-20 NOTE — Progress Notes (Addendum)
Patient returned from cath lab around 0245. No concerns at this time.

## 2019-04-20 NOTE — OR Nursing (Signed)
Chest congestion reported to Dr Welton Flakes, no new orders. IV heparin infusion on hold for procedure.

## 2019-04-20 NOTE — Consult Note (Signed)
ANTICOAGULATION CONSULT NOTE   Pharmacy Consult for Heparin Dosing  Indication: NSTEMI   Allergies  Allergen Reactions  . Amoxicillin Anaphylaxis  . Sulfa Antibiotics Anaphylaxis  . Pravastatin Other (See Comments)  . Rosuvastatin Other (See Comments)    Patient Measurements: Height: 5\' 8"  (172.7 cm) Weight: 227 lb 11.8 oz (103.3 kg) IBW/kg (Calculated) : 68.4 Heparin Dosing Weight: 91.1 kg   Vital Signs: Temp: 98.2 F (36.8 C) (05/14 0000) Temp Source: Oral (05/14 0000) BP: 129/84 (05/14 0000) Pulse Rate: 76 (05/14 0000)  Labs: Recent Labs    04/19/19 0735 04/19/19 1433 04/19/19 1647 04/20/19 0052  HGB 13.9  --   --   --   HCT 43.0  --   --   --   PLT 292  --   --   --   APTT 41*  --   --   --   LABPROT 25.9* 26.6*  --   --   INR 2.4* 2.5*  --   --   HEPARINUNFRC  --   --  0.36 0.47  CREATININE 0.79 0.87  --   --   TROPONINI <0.03  --   --   --     Estimated Creatinine Clearance: 92.1 mL/min (by C-G formula based on SCr of 0.87 mg/dL).  Tropinin <0.03  Medications:  Warfarin 1 mg - last taken on 04/18/2019 @ ~9:30 pm (per patient report when I called the room)  Assessment: Baseline labs have been obtained for this patient. Patient has a h/o afib. He does not meet the STEMI criteria.  Given his INR is therapeutic, patient does not need a bolus dose.  Heparin Course: 5/13 1647 HL 0.36 5/14 0052 HL 0.47  Goal of Therapy:  Heparin level 0.3-0.7 units/ml  Plan:  5/14 @ 0052 HL 0.47. Level now therapeutic x2. Will continue current infusion rate of 1050 units/hr. Will continue to check HL and CBCs daily while on heparin infusion per protocol.   Gardner Candle, PharmD, BCPS Clinical Pharmacist 04/20/2019 1:37 AM

## 2019-04-20 NOTE — Progress Notes (Signed)
[employer] - [place] at [facility]   PATIENT NAME: Roger [name]    MR#:  [id number]  DATE OF BIRTH:  10-Mar-1946  SUBJECTIVE:  Planned dor cardiac cath  REVIEW OF SYSTEMS:    Review of Systems  Constitutional: Negative for fever, chills weight loss HENT: Negative for ear pain, nosebleeds, congestion, facial swelling, rhinorrhea, neck pain, neck stiffness and ear discharge.   Respiratory: Negative for cough, shortness of breath, wheezing  Cardiovascular: Negative for chest pain, palpitations and leg swelling.  Gastrointestinal: Negative for heartburn, abdominal pain, vomiting, diarrhea or consitpation Genitourinary: Negative for dysuria, urgency, frequency, hematuria Musculoskeletal: Negative for back pain or joint pain Neurological: Negative for dizziness, seizures, syncope, focal weakness,  numbness and headaches.  Hematological: Does not bruise/bleed easily.  Psychiatric/Behavioral: Negative for hallucinations, confusion, dysphoric mood    Tolerating Diet: npo      DRUG ALLERGIES:   Allergies  Allergen Reactions  . Amoxicillin Anaphylaxis  . Sulfa Antibiotics Anaphylaxis  . Pravastatin Other (See Comments)  . Rosuvastatin Other (See Comments)    VITALS:  Blood pressure (!) 151/97, pulse 78, temperature 98.2 F (36.8 C), temperature source Oral, resp. rate (!) 23, height 5\' 8"  (1.727 m), weight 103.3 kg, SpO2 100 %.  PHYSICAL EXAMINATION:  Constitutional: Appears well-developed and well-nourished. No distress. HENT: Normocephalic. Marland Kitchen Oropharynx is clear and moist.  Eyes: Conjunctivae and EOM are normal. PERRLA, no scleral icterus.  Neck: Normal ROM. Neck supple. No JVD. No tracheal deviation. CVS:irr, irr S1/S2 +, no murmurs, no gallops, no carotid bruit.  Pulmonary: Effort and breath sounds normal, no stridor, rhonchi, wheezes, rales.  Abdominal: Soft. BS +,  no distension, tenderness, rebound or guarding.  Musculoskeletal: Normal range of  motion. No edema and no tenderness.  Neuro: Alert. CN 2-12 grossly intact. No focal deficits. Skin: Skin is warm and dry. No rash noted. Psychiatric: Normal mood and affect.      LABORATORY PANEL:   CBC Recent Labs  Lab 04/20/19 0517  WBC 12.4*  HGB 12.9*  HCT 40.5  PLT 220   ------------------------------------------------------------------------------------------------------------------  Chemistries  Recent Labs  Lab 04/19/19 0735  04/20/19 0517  NA 140   < > 138  K 3.9   < > 4.5  CL 104   < > 105  CO2 26   < > 25  GLUCOSE 184*   < > 123*  BUN 20   < > 18  CREATININE 0.79   < > 0.77  CALCIUM 8.5*   < > 8.2*  MG 4.0*  --   --   AST 17  --   --   ALT 13  --   --   ALKPHOS 66  --   --   BILITOT 1.5*  --   --    < > = values in this interval not displayed.   ------------------------------------------------------------------------------------------------------------------  Cardiac Enzymes Recent Labs  Lab 04/19/19 0735  TROPONINI <0.03   ------------------------------------------------------------------------------------------------------------------  RADIOLOGY:  Dg Chest Portable 1 View  Result Date: 04/19/2019 CLINICAL DATA:  73 year old male status post VFib arrest. Possible sternal fracture. EXAM: PORTABLE CHEST 1 VIEW COMPARISON:  11/25/2016 and earlier. FINDINGS: Portable AP semi upright view at 0707 hours. Pacer or resuscitation pads project over the chest. Low lung volumes and somewhat lordotic view. Mediastinal contours appear stable. Allowing for portable technique the lungs are clear. 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 There is a right lateral 3rd rib fracture. No other displaced rib fracture identified.  Negative visible bowel gas pattern. IMPRESSION: 1. Right lateral 3rd rib fracture. No acute cardiopulmonary abnormality identified. 2. 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 Electronically Signed   By: Odessa FlemingH   Hall M.D.   On: 04/19/2019 07:30     ASSESSMENT AND PLAN:   73 year old male with history of chronic atrial fibrillation on Coumadin who presented to the emergency room due to dizziness and suffered brief episode of V. fib arrest in the ER.   1.  Brief V. fib arrest: Patient is status post cardiac arrest and received ACLS protocol.  Patient is planned for cardiac catheterization.   2.  Chronic atrial fibrillation: Coumadin on hold for cardiac cath   3.  Essential hypertension on lisinopril and metoprolol  Management plans discussed with the patient and he is in agreement.  CODE STATUS: full  TOTAL TIME TAKING CARE OF THIS PATIENT: 28 minutes.     POSSIBLE D/C ?, DEPENDING ON CLINICAL CONDITION.   Roger [name] M.D on 04/20/2019 at 10:39 AM  Between 7am to 6pm - Pager - [phone] After 6pm go to [email or web address] - password [facility] [email or web address]  [facility] [place] [facility]  Office  [phone]  CC: Primary care physician; Roger RudBabaoff, Marcus, MD  Note: This dictation was prepared with Dragon dictation along with smaller phrase technology. Any transcriptional errors that result from this process are unintentional.

## 2019-04-20 NOTE — Progress Notes (Signed)
Patient transferred to Special procedures.

## 2019-04-21 LAB — BASIC METABOLIC PANEL WITH GFR
Anion gap: 9 (ref 5–15)
BUN: 19 mg/dL (ref 8–23)
CO2: 24 mmol/L (ref 22–32)
Calcium: 8.3 mg/dL — ABNORMAL LOW (ref 8.9–10.3)
Chloride: 103 mmol/L (ref 98–111)
Creatinine, Ser: 0.81 mg/dL (ref 0.61–1.24)
GFR calc Af Amer: >60 mL/min
GFR calc non Af Amer: >60 mL/min
Glucose, Bld: 128 mg/dL — ABNORMAL HIGH (ref 70–99)
Potassium: 4.4 mmol/L (ref 3.5–5.1)
Sodium: 136 mmol/L (ref 135–145)

## 2019-04-21 LAB — CBC
HCT: 36.9 % — ABNORMAL LOW (ref 39.0–52.0)
Hemoglobin: 11.8 g/dL — ABNORMAL LOW (ref 13.0–17.0)
MCH: 30.1 pg (ref 26.0–34.0)
MCHC: 32 g/dL (ref 30.0–36.0)
MCV: 94.1 fL (ref 80.0–100.0)
Platelets: 209 10*3/uL (ref 150–400)
RBC: 3.92 MIL/uL — ABNORMAL LOW (ref 4.22–5.81)
RDW: 14.9 % (ref 11.5–15.5)
WBC: 14.8 10*3/uL — ABNORMAL HIGH (ref 4.0–10.5)
nRBC: 0 % (ref 0.0–0.2)

## 2019-04-21 MED ORDER — APIXABAN 5 MG PO TABS: 5.0000 mg | ORAL_TABLET | Freq: Two times a day (BID) | ORAL | 0 refills | Status: DC

## 2019-04-21 MED ORDER — AMIODARONE HCL 200 MG PO TABS
400.0000 mg | ORAL_TABLET | Freq: Two times a day (BID) | ORAL | Status: DC
  Administered 2019-04-21: 400 mg via ORAL
  Filled 2019-04-21: qty 2

## 2019-04-21 MED ORDER — LISINOPRIL 20 MG PO TABS: 20.0000 mg | ORAL_TABLET | Freq: Every day | ORAL | Status: DC

## 2019-04-21 MED ORDER — ENOXAPARIN SODIUM 100 MG/ML ~~LOC~~ SOLN
100.0000 mg | Freq: Two times a day (BID) | SUBCUTANEOUS | Status: DC
  Administered 2019-04-21: 100 mg via SUBCUTANEOUS
  Filled 2019-04-21: qty 1

## 2019-04-21 MED ORDER — AMIODARONE HCL 400 MG PO TABS: 400.0000 mg | ORAL_TABLET | Freq: Every day | ORAL | 0 refills | Status: DC

## 2019-04-21 MED ORDER — CLOPIDOGREL BISULFATE 75 MG PO TABS: 75.0000 mg | ORAL_TABLET | Freq: Every day | ORAL | 0 refills | Status: DC

## 2019-04-21 MED ORDER — PANTOPRAZOLE SODIUM 40 MG PO TBEC: 40.0000 mg | DELAYED_RELEASE_TABLET | Freq: Every day | ORAL | 0 refills | Status: AC

## 2019-04-21 MED ORDER — CLOPIDOGREL BISULFATE 75 MG PO TABS
75.0000 mg | ORAL_TABLET | Freq: Every day | ORAL | Status: DC
  Administered 2019-04-21: 75 mg via ORAL
  Filled 2019-04-21: qty 1

## 2019-04-21 NOTE — TOC Initial Note (Signed)
Transition of Care [facility]) - Initial/Assessment Note    Patient Details  Name: Roger [name] MRN: [id number] Date of Birth: January 14, 1946  [facility]) CM/SW Contact:    Su Hilt, RN Phone Number: 04/21/2019, 1:45 PM  Clinical Narrative:                 Met with the patient to discuss needs and DC plan, he lives alone and has a brother that lives out of town He is being set up with a life vest via [place], he was provided a taxi voucher since he is unable to drive at this time He drove himself to the hospital, He has follow up appointments set up already,  He goes to the Doctor at the [place] He uses [facility] for pharmacy He can afford his medications   Expected Discharge Plan: Home/Self Care Barriers to Discharge: Barriers Resolved   Patient Goals and CMS Choice Patient states their goals for this hospitalization and ongoing recovery are:: to make sure my heart stays good  CMS Medicare.gov Compare Post Acute Care list provided to:: Patient Choice offered to / list presented to : Patient  Expected Discharge Plan and Services Expected Discharge Plan: Home/Self Care   Discharge Planning Services: CM Consult Post Acute Care Choice: Home Health, Durable Medical Equipment Living arrangements for the past 2 months: Single Family Home Expected Discharge Date: 04/21/19               DME Arranged: Life vest         HH Arranged: RN, PT, Nurse's Aide HH Agency: [place] Date [place]: 04/21/19 Time HH Agency Contacted: 30 Representative spoke with at [place]: cheryl  Prior Living Arrangements/Services Living arrangements for the past 2 months: [place] Lives with:: Self Patient language and need for interpreter reviewed:: No Do you feel safe going back to the place where you live?: Yes      Need for Family Participation in Patient Care: No (Comment) Care giver support system in place?: No (comment) Current home  services: (none) Criminal Activity/Legal Involvement Pertinent to Current Situation/Hospitalization: No - Comment as needed  Activities of Daily Living Home Assistive Devices/Equipment: None ADL Screening (condition at time of admission) Patient's cognitive ability adequate to safely complete daily activities?: Yes Is the patient deaf or have difficulty hearing?: No Does the patient have difficulty seeing, even when wearing glasses/contacts?: No Does the patient have difficulty concentrating, remembering, or making decisions?: No Patient able to express need for assistance with ADLs?: Yes Does the patient have difficulty dressing or bathing?: No Independently performs ADLs?: Yes (appropriate for developmental age) Does the patient have difficulty walking or climbing stairs?: No Weakness of Legs: None Weakness of Arms/Hands: None  Permission Sought/Granted Permission sought to share information with : Case Manager                Emotional Assessment Appearance:: Appears stated age Attitude/Demeanor/Rapport: Engaged Affect (typically observed): Accepting Orientation: : Oriented to Self, Oriented to Place, Oriented to  Time, Oriented to Situation Alcohol / Substance Use: Tobacco Use Psych Involvement: No (comment)  Admission diagnosis:  Ventricular fibrillation (HCC) [I49.01] Dizziness [R42] Closed fracture of one rib of right side, initial encounter [S22.31XA] Patient Active Problem List   Diagnosis Date Noted  . Ventricular fibrillation ([place]) 04/19/2019   PCP:  Derinda Late, MD Pharmacy:   [id number] - [address] [place] [address] Phone: [phone] Fax: [phone]     Social Determinants of Health (SDOH) Interventions    Readmission Risk Interventions Readmission Risk Prevention Plan 04/21/2019  Post Dischage Appt Complete  Medication Screening Complete   Transportation Screening Complete  Some recent data might be hidden

## 2019-04-21 NOTE — Progress Notes (Addendum)
[employer] - [place] at [facility]   PATIENT NAME: Roger [name]    MR#:  [id number]  DATE OF BIRTH:  [birth year]  SUBJECTIVE:  Sob this am  REVIEW OF SYSTEMS:    Review of Systems  Constitutional: Negative for fever, chills weight loss HENT: Negative for ear pain, nosebleeds, congestion, facial swelling, rhinorrhea, neck pain, neck stiffness and ear discharge.   Respiratory: Negative for cough, +reports shortness of breath, NO wheezing  Cardiovascular: Negative for chest pain, palpitations and leg swelling.  Gastrointestinal: Negative for heartburn, abdominal pain, vomiting, diarrhea or consitpation Genitourinary: Negative for dysuria, urgency, frequency, hematuria Musculoskeletal: Negative for back pain or joint pain Neurological: Negative for dizziness, seizures, syncope, focal weakness,  numbness and headaches.  Hematological: Does not bruise/bleed easily.  Psychiatric/Behavioral: Negative for hallucinations, confusion, dysphoric mood    Tolerating Diet:  yes     DRUG ALLERGIES:   Allergies  Allergen Reactions  . Amoxicillin Anaphylaxis  . Sulfa Antibiotics Anaphylaxis  . Pravastatin Other (See Comments)  . Rosuvastatin Other (See Comments)    VITALS:  Blood pressure (!) 162/114, pulse 92, temperature 98.4 F (36.9 C), temperature source Oral, resp. rate (!) 25, height 5\' 8"  (1.727 m), weight 103.3 kg, SpO2 95 %.  PHYSICAL EXAMINATION:  Constitutional: Appears well-developed and well-nourished. No distress. HENT: Normocephalic. Marland Kitchen Oropharynx is clear and moist.  Eyes: Conjunctivae and EOM are normal. PERRLA, no scleral icterus.  Neck: Normal ROM. Neck supple. No JVD. No tracheal deviation. CVS:irr, irr S1/S2 +, no murmurs, no gallops, no carotid bruit.  Pulmonary: Effort and breath sounds normal, no stridor, rhonchi, wheezes, rales.  Abdominal: Soft. BS +,  no distension, tenderness, rebound or guarding.  Musculoskeletal: Normal range of motion.  No edema and no tenderness.  Neuro: Alert. CN 2-12 grossly intact. No focal deficits. Skin: Skin is warm and dry. No rash noted. Psychiatric: Normal mood and affect.      LABORATORY PANEL:   CBC Recent Labs  Lab 04/21/19 0416  WBC 14.8*  HGB 11.8*  HCT 36.9*  PLT 209   ------------------------------------------------------------------------------------------------------------------  Chemistries  Recent Labs  Lab 04/19/19 0735  04/21/19 0416  NA 140   < > 136  K 3.9   < > 4.4  CL 104   < > 103  CO2 26   < > 24  GLUCOSE 184*   < > 128*  BUN 20   < > 19  CREATININE 0.79   < > 0.81  CALCIUM 8.5*   < > 8.3*  MG 4.0*  --   --   AST 17  --   --   ALT 13  --   --   ALKPHOS 66  --   --   BILITOT 1.5*  --   --    < > = values in this interval not displayed.   ------------------------------------------------------------------------------------------------------------------  Cardiac Enzymes Recent Labs  Lab 04/19/19 0735  TROPONINI <0.03   ------------------------------------------------------------------------------------------------------------------  RADIOLOGY:  No results found.   ASSESSMENT AND PLAN:   73 year old male with history of chronic atrial fibrillation on Coumadin who presented to the emergency room due to dizziness and suffered brief episode of V. fib arrest in the ER.   1.  Brief V. fib arrest: Patient is status post cardiac arrest and received ACLS protocol.   Patient underwent cardiac cath and PCI with DES to mid circumflex. Plan is for AICD next week.   2.  Chronic atrial fibrillation: Coumadin on hold for ICD.  Heart rate control. Continue amiodarone drip   3.  Essential hypertension on lisinopril and metoprolol 4. Tobacco dependence: Patient is encouraged to quit smoking and willing to attempt to quit was assessed. Patient highly motivated.Counseling was provided for 4 minutes.  Management plans discussed with the patient and he is in  agreement.  CODE STATUS: full  TOTAL TIME TAKING CARE OF THIS PATIENT: 28 minutes.   Discussed with cardiology Plan for AICD placement hopefully early next week.   POSSIBLE D/C ?, DEPENDING ON CLINICAL CONDITION.   Adrian SaranSital Karenann [name] M.D on 04/21/2019 at 10:51 AM  Between 7am to 6pm - Pager - [phone] After 6pm go to [email or web address] - password [facility] [email or web address]  [facility] [place] [facility]  Office  [phone]  CC: Primary care physician; Kandyce RudBabaoff, Marcus, MD  Note: This dictation was prepared with Dragon dictation along with smaller phrase technology. Any transcriptional errors that result from this process are unintentional.

## 2019-04-21 NOTE — Progress Notes (Signed)
Patient discharged by wheelchair to medical mall- the taxi cab has arrived to bring him home.  IV's and telemetry removed.  Per Case management and Dr. Juliene Pina- patient's life vest will be delivered tomorrow to patient's home.

## 2019-04-21 NOTE — TOC Transition Note (Addendum)
Transition of Care [facility]) - CM/SW Discharge Note   Patient Details  Name: Roger [name] MRN: [id number] Date of Birth: 10-20-1946  [facility]) CM/SW Contact:  Virgel Manifold, RN Phone Number: 04/21/2019, 12:57 PM   Clinical Narrative:  Patient to be discharged per MD order. Orders in place for home health services. Patient lives alone. CMS Medicare.gov Compare Post Acute Care list reviewed with patient and he has no preference of agency. Referral placed with Elnita Maxwell from [place]. Patient in need of life vest until ACID can be placed. RNCM working on this. Eliquis card given.   Update 15:20- [employer] life vest referral complete, all needed info was faxed to Tammy with the agency. Awaiting scheduled delivery of vest ti hospital, likely tomorrow 5/16 per Tammy.   Update 15:51- Per MD patient does NOT need to discharge with vest. Vest is scheduled to be delivered to home address. RNCM confirmed home address with patient for delivery. Per Tammy with [employer] delivery will likely be tomorrow.    Final next level of care: Home w Home Health Services Barriers to Discharge: No Barriers Identified   Patient Goals and CMS Choice Patient states their goals for this hospitalization and ongoing recovery are:: to make sure my heart stays good  CMS Medicare.gov Compare Post Acute Care list provided to:: Patient Choice offered to / list presented to : Patient  Discharge Placement                       Discharge Plan and Services   Discharge Planning Services: CM Consult Post Acute Care Choice: Home Health, Durable Medical Equipment          DME Arranged: Life vest         HH Arranged: RN, PT, Nurse's Aide HH Agency: [facility] Date [facility] Agency Contacted: 04/21/19 Time HH Agency Contacted: [phone] Representative spoke with at [facility] Agency: cheryl  Social Determinants of Health (SDOH) Interventions     Readmission Risk Interventions Readmission Risk Prevention  Plan 04/21/2019  Post Dischage Appt Complete  Medication Screening Complete  Transportation Screening Complete  Some recent data might be hidden

## 2019-04-21 NOTE — Discharge Summary (Addendum)
[facility] - [place] at [facility]   PATIENT NAME: Roger [name]    MR#:  [id number]  DATE OF BIRTH:  1946-09-30  DATE OF ADMISSION:  04/19/2019 ADMITTING PHYSICIAN: Delfino Lovett, MD  DATE OF DISCHARGE: 04/21/2019  PRIMARY CARE PHYSICIAN: Kandyce Rud, MD    ADMISSION DIAGNOSIS:  Ventricular fibrillation (HCC) [I49.01] Dizziness [R42] Closed fracture of one rib of right side, initial encounter [S22.31XA]  DISCHARGE DIAGNOSIS:  Active Problems:   Ventricular fibrillation (HCC)   SECONDARY DIAGNOSIS:   Past Medical History:  Diagnosis Date  . Atrial fibrillation (HCC)   . Hypertension     HOSPITAL COURSE:  73 year old male with history of chronic atrial fibrillation on Coumadin who presented to the emergency room due to dizziness and suffered brief episode of V. fib arrest in the ER.   1.  Brief V. fib arrest: Patient is status post cardiac arrest and received ACLS protocol.   Patient underwent cardiac cath and PCI with DES to mid circumflex.This may have been culprit of cardiac arrest. The lesion is fixwd and hopefully will not be an issue in the hfuture. He will still need an EP evaluation and a life vest [name] ordered and will be delivedred tomorrow to his house. This [name] discussed with Dr Welton Flakes who states it is ok to discharge patient today since lesion is fixed with PCI.   Patient has allergy reaction to all statins.  He will be discharged on Eliquis, Plavix (due to new stent) and metoprolol. (PPI also ordered) He will follow-up with cardiology in 3 to 4 days. Cardiac rehab [name] ordered prior to discharge.    2.  Chronic atrial fibrillation: Patient will be on Eliquis. Se/a/r/b/discussed with patient. AMIOdarone 400 mg daily ordered at discharge per Cardiology.   Continue metoprolol for heart rate control 3  3  Essential hypertension on lisinopril and metoprolol 4. Tobacco dependence: Patient is encouraged to quit smoking and willing to attempt to  quit [name] assessed. Patient highly motivated.Counseling [name] provided for 4 minutes.   DISCHARGE CONDITIONS AND DIET:  'stable Regular diet   CONSULTS OBTAINED:  Treatment Team:  Laurier Nancy, MD  DRUG ALLERGIES:   Allergies  Allergen Reactions  . Amoxicillin Anaphylaxis  . Sulfa Antibiotics Anaphylaxis  . Pravastatin Other (See Comments)  . Rosuvastatin Other (See Comments)    DISCHARGE MEDICATIONS:   Allergies as of 04/21/2019      Reactions   Amoxicillin Anaphylaxis   Sulfa Antibiotics Anaphylaxis   Pravastatin Other (See Comments)   Rosuvastatin Other (See Comments)      Medication List    TAKE these medications   amiodarone 400 MG tablet Commonly known as:  PACERONE Take 1 tablet (400 mg total) by mouth daily.   apixaban 5 MG Tabs tablet Commonly known as:  Eliquis Take 1 tablet (5 mg total) by mouth 2 (two) times daily.   clopidogrel 75 MG tablet Commonly known as:  PLAVIX Take 1 tablet (75 mg total) by mouth daily.   fluticasone 50 MCG/ACT nasal spray Commonly known as:  FLONASE Place 2 sprays into the nose daily.   lisinopril 10 MG tablet Commonly known as:  ZESTRIL Take 5 mg by mouth daily.   metoprolol tartrate 25 MG tablet Commonly known as:  LOPRESSOR Take 25 mg by mouth 2 (two) times daily.   pantoprazole 40 MG tablet Commonly known as:  Protonix Take 1 tablet (40 mg total) by mouth daily.         Today  CHIEF COMPLAINT:  Stable Cardiac diet   VITAL SIGNS:  Blood pressure 132/71, pulse 92, temperature 98.1 F (36.7 C), temperature source Oral, resp. rate (!) 25, height 5\' 8"  (1.727 m), weight 103.3 kg, SpO2 95 %.   REVIEW OF SYSTEMS:  Review of Systems  Constitutional: Negative.  Negative for chills, fever and malaise/fatigue.  HENT: Negative.  Negative for ear discharge, ear pain, hearing loss, nosebleeds and sore throat.   Eyes: Negative.  Negative for blurred vision and pain.  Respiratory: Positive for shortness  of breath. Negative for cough, hemoptysis and wheezing.   Cardiovascular: Negative.  Negative for chest pain, palpitations and leg swelling.  Gastrointestinal: Negative.  Negative for abdominal pain, blood in stool, diarrhea, nausea and vomiting.  Genitourinary: Negative.  Negative for dysuria.  Musculoskeletal: Negative.  Negative for back pain.  Skin: Negative.   Neurological: Negative for dizziness, tremors, speech change, focal weakness, seizures and headaches.  Endo/Heme/Allergies: Negative.  Does not bruise/bleed easily.  Psychiatric/Behavioral: Negative.  Negative for depression, hallucinations and suicidal ideas.     PHYSICAL EXAMINATION:  GENERAL:  73 y.o.-year-old patient lying in the bed with no acute distress.  NECK:  Supple, no jugular venous distention. No thyroid enlargement, no tenderness.  LUNGS: Normal breath sounds bilaterally, no wheezing, rales,rhonchi  No use of accessory muscles of respiration.  CARDIOVASCULAR: S1, S2 normal. No murmurs, rubs, or gallops.  ABDOMEN: Soft, non-tender, non-distended. Bowel sounds present. No organomegaly or mass.  EXTREMITIES: No pedal edema, cyanosis, or clubbing.  PSYCHIATRIC: The patient is alert and oriented x 3.  SKIN: No obvious rash, lesion, or ulcer.   DATA REVIEW:   CBC Recent Labs  Lab 04/21/19 0416  WBC 14.8*  HGB 11.8*  HCT 36.9*  PLT 209    Chemistries  Recent Labs  Lab 04/19/19 0735  04/21/19 0416  NA 140   < > 136  K 3.9   < > 4.4  CL 104   < > 103  CO2 26   < > 24  GLUCOSE 184*   < > 128*  BUN 20   < > 19  CREATININE 0.79   < > 0.81  CALCIUM 8.5*   < > 8.3*  MG 4.0*  --   --   AST 17  --   --   ALT 13  --   --   ALKPHOS 66  --   --   BILITOT 1.5*  --   --    < > = values in this interval not displayed.    Cardiac Enzymes Recent Labs  Lab 04/19/19 0735  TROPONINI <0.03    Microbiology Results  @MICRORSLT48 @  RADIOLOGY:  No results found.    Allergies as of 04/21/2019       Reactions   Amoxicillin Anaphylaxis   Sulfa Antibiotics Anaphylaxis   Pravastatin Other (See Comments)   Rosuvastatin Other (See Comments)      Medication List    TAKE these medications   amiodarone 400 MG tablet Commonly known as:  PACERONE Take 1 tablet (400 mg total) by mouth daily.   apixaban 5 MG Tabs tablet Commonly known as:  Eliquis Take 1 tablet (5 mg total) by mouth 2 (two) times daily.   clopidogrel 75 MG tablet Commonly known as:  PLAVIX Take 1 tablet (75 mg total) by mouth daily.   fluticasone 50 MCG/ACT nasal spray Commonly known as:  FLONASE Place 2 sprays into the nose daily.   lisinopril 10 MG tablet Commonly  known as:  ZESTRIL Take 5 mg by mouth daily.   metoprolol tartrate 25 MG tablet Commonly known as:  LOPRESSOR Take 25 mg by mouth 2 (two) times daily.   pantoprazole 40 MG tablet Commonly known as:  Protonix Take 1 tablet (40 mg total) by mouth daily.           Management plans discussed with the patient and he is in agreement. Stable for discharge   Patient should follow up with pcp  CODE STATUS:     Code Status Orders  (From admission, onward)         Start     Ordered   04/19/19 1223  Full code  Continuous     04/19/19 1222        Code Status History    This patient has a current code status but no historical code status.      TOTAL TIME TAKING CARE OF THIS PATIENT: 38 minutes.    Note: This dictation [name] prepared with Dragon dictation along with smaller phrase technology. Any transcriptional errors that result from this process are unintentional.  Adrian Saran M.D on 04/21/2019 at 3:46 PM  Between 7am to 6pm - Pager - [phone] After 6pm go to [email or web address] - Social research officer, government  [facility]  Office  [phone]  CC: Primary care physician; Kandyce Rud, MD

## 2019-04-21 NOTE — Progress Notes (Addendum)
SUBJECTIVE: Pt reports chest soreness but improving, mild shortness of breath.  Vitals:   04/21/19 0600 04/21/19 0700 04/21/19 0800 04/21/19 0900  BP: (!) 141/82 (!) 141/79 (!) 155/132 (!) 162/114  Pulse: 93 90  92  Resp: (!) 26 (!) 22  (!) 25  Temp:   98.4 F (36.9 C)   TempSrc:   Oral   SpO2: 94% 97%  95%  Weight:      Height:        Intake/Output Summary (Last 24 hours) at 04/21/2019 0956 Last data filed at 04/21/2019 0856 Gross per 24 hour  Intake 1606.37 ml  Output 850 ml  Net 756.37 ml    LABS: Basic Metabolic Panel: Recent Labs    04/19/19 0735  04/20/19 1432 04/21/19 0416  NA 140   < > 136 136  K 3.9   < > 4.6 4.4  CL 104   < > 103 103  CO2 26   < > 24 24  GLUCOSE 184*   < > 106* 128*  BUN 20   < > 17 19  CREATININE 0.79   < > 0.82 0.81  CALCIUM 8.5*   < > 8.2* 8.3*  MG 4.0*  --   --   --    < > = values in this interval not displayed.   Liver Function Tests: Recent Labs    04/19/19 0735  AST 17  ALT 13  ALKPHOS 66  BILITOT 1.5*  PROT 6.1*  ALBUMIN 3.4*   Recent Labs    04/19/19 0735  LIPASE 25   CBC: Recent Labs    04/19/19 0735  04/20/19 1432 04/21/19 0416  WBC 13.7*   < > 14.6* 14.8*  NEUTROABS 10.8*  --   --   --   HGB 13.9   < > 12.6* 11.8*  HCT 43.0   < > 39.5 36.9*  MCV 95.1   < > 95.0 94.1  PLT 292   < > 214 209   < > = values in this interval not displayed.   Cardiac Enzymes: Recent Labs    04/19/19 0735  TROPONINI <0.03   BNP: Invalid input(s): POCBNP D-Dimer: No results for input(s): DDIMER in the last 72 hours. Hemoglobin A1C: No results for input(s): HGBA1C in the last 72 hours. Fasting Lipid Panel: No results for input(s): CHOL, HDL, LDLCALC, TRIG, CHOLHDL, LDLDIRECT in the last 72 hours. Thyroid Function Tests: No results for input(s): TSH, T4TOTAL, T3FREE, THYROIDAB in the last 72 hours.  Invalid input(s): FREET3 Anemia Panel: No results for input(s): VITAMINB12, FOLATE, FERRITIN, TIBC, IRON, RETICCTPCT in  the last 72 hours.   PHYSICAL EXAM General: Well developed, well nourished, in no acute distress HEENT:  Normocephalic and atramatic Neck:  No JVD.  Lungs: Pt is short of breath.Clear bilaterally to auscultation and percussion. Heart: HRRR . Normal S1 and S2 without gallops or murmurs.  Abdomen: Bowel sounds are positive, abdomen soft and non-tender  Msk:  Back normal, normal gait. Normal strength and tone for age. Extremities: No clubbing, cyanosis or edema.   Neuro: Alert and oriented X 3. Psych:  Good affect, responds appropriately  TELEMETRY: Atrial fibrillation 97bpm  ASSESSMENT AND PLAN: S/P ventricular fibrillation and cardiac arrest: Yesterday had PCI with DES to mid circumflex which he tolerated well and patient was placed in Brilinta and aspirin. No chest pain currently but has new shortness of breath. Continue to monitor, if shortness of breath does not improve could be due to adverse  effect of Brilinta.   Patient is candidate for ICD, however s/p new stent will need 1 month plavix and aspirin before surgery. Ordered LifeVest, may be discharged home after placing.  Continue oral amiodarone at 400mg  BID and follow up [facility] Monday 10:30am.   Chronic Atrial fib: Rate controlled, will start on Eliquis for this month as is easier with pending surgery.  Active Problems:   Ventricular fibrillation (HCC)    Caroleen HammanKristin Ferrah Panagopoulos, NP-C 04/21/2019 9:56 AM Cell: [phone]

## 2019-06-10 ENCOUNTER — Emergency Department
Admission: EM | Admit: 2019-06-10 | Discharge: 2019-06-10 | Disposition: A | Discharge status: Home / Self Care | Payer: Medicare Other | Attending: Emergency Medicine | Admitting: Emergency Medicine

## 2019-06-10 ENCOUNTER — Other Ambulatory Visit: Payer: Self-pay

## 2019-06-10 ENCOUNTER — Encounter: Payer: Self-pay | Admitting: Emergency Medicine

## 2019-06-10 DIAGNOSIS — I10 Essential (primary) hypertension: Secondary | ICD-10-CM | POA: Diagnosis not present

## 2019-06-10 DIAGNOSIS — F458 Other somatoform disorders: Secondary | ICD-10-CM | POA: Diagnosis not present

## 2019-06-10 DIAGNOSIS — E860 Dehydration: Secondary | ICD-10-CM | POA: Insufficient documentation

## 2019-06-10 DIAGNOSIS — Z7901 Long term (current) use of anticoagulants: Secondary | ICD-10-CM | POA: Diagnosis not present

## 2019-06-10 DIAGNOSIS — R0989 Other specified symptoms and signs involving the circulatory and respiratory systems: Secondary | ICD-10-CM

## 2019-06-10 DIAGNOSIS — F17210 Nicotine dependence, cigarettes, uncomplicated: Secondary | ICD-10-CM | POA: Insufficient documentation

## 2019-06-10 DIAGNOSIS — Z79899 Other long term (current) drug therapy: Secondary | ICD-10-CM | POA: Diagnosis not present

## 2019-06-10 DIAGNOSIS — R1310 Dysphagia, unspecified: Secondary | ICD-10-CM | POA: Diagnosis present

## 2019-06-10 LAB — CBC
HCT: 39.9 % (ref 39.0–52.0)
Hemoglobin: 12.6 g/dL — ABNORMAL LOW (ref 13.0–17.0)
MCH: 31.4 pg (ref 26.0–34.0)
MCHC: 31.6 g/dL (ref 30.0–36.0)
MCV: 99.5 fL (ref 80.0–100.0)
Platelets: 284 10*3/uL (ref 150–400)
RBC: 4.01 MIL/uL — ABNORMAL LOW (ref 4.22–5.81)
RDW: 15.4 % (ref 11.5–15.5)
WBC: 10.6 10*3/uL — ABNORMAL HIGH (ref 4.0–10.5)
nRBC: 0 % (ref 0.0–0.2)

## 2019-06-10 LAB — BASIC METABOLIC PANEL
Anion gap: 9 (ref 5–15)
BUN: 21 mg/dL (ref 8–23)
CO2: 26 mmol/L (ref 22–32)
Calcium: 9.1 mg/dL (ref 8.9–10.3)
Chloride: 106 mmol/L (ref 98–111)
Creatinine, Ser: 1.33 mg/dL — ABNORMAL HIGH (ref 0.61–1.24)
GFR calc Af Amer: >60 mL/min (ref >60)
GFR calc non Af Amer: 54 mL/min — ABNORMAL LOW (ref >60)
Glucose, Bld: 130 mg/dL — ABNORMAL HIGH (ref 70–99)
Potassium: 3.9 mmol/L (ref 3.5–5.1)
Sodium: 141 mmol/L (ref 135–145)

## 2019-06-10 MED ORDER — SODIUM CHLORIDE 0.9 % IV BOLUS
500.0000 mL | Freq: Once | INTRAVENOUS | Status: AC
  Administered 2019-06-10: 11:00:00 500 mL via INTRAVENOUS

## 2019-06-10 NOTE — Discharge Instructions (Addendum)
Stop Lisinopril.  Follow-up with Dr. Humphrey Rolls Monday as planned

## 2019-06-10 NOTE — ED Triage Notes (Signed)
States feels like he had had trouble swallowing x 3 days. States he thinks it might be throat swelling due to one of his medications. No resp distress.

## 2019-06-10 NOTE — ED Provider Notes (Addendum)
[facility] Emergency Department Provider Note   ____________________________________________   First MD Initiated Contact with Patient 06/10/19 0831     (approximate)  I have reviewed the triage vital signs and the nursing notes.   HISTORY  Chief Complaint Dysphagia    HPI Roger ICENOGLE is a 73 y.o. male here for evaluation of noticing it just seems little difficult to swallow at times feels like a little bit of a strange feeling in the back of his throat.  He is able to eat and drink well, but reports that  on the last couple days he is just felt like the back of his tongue and back of his throat feels very dry.  He gets better after he drinks something.  He is also felt just slightly lightheaded when he gets up at the first 10 to 15 minutes for the last couple of days as well  Recently discharged from the hospital after cardiac arrest and placed on new medications as well as water pills  No pain no shortness of breath.  No palpitations.  No headache.  Has not passed out does not feel as though he did pass out feeling a little lightheaded the last couple days.  He also reports increased thirst and a little bit of dryness he reports over the back of his nose into his mouth and upper throat  Patient would like me to review his medications and make certain that they do not cause any throat swelling because this feeling he is also experienced  Past Medical History:  Diagnosis Date  . Atrial fibrillation ([place])   . Hypertension     Patient Active Problem List   Diagnosis Date Noted  . Ventricular fibrillation ([place]) 04/19/2019    Past Surgical History:  Procedure Laterality Date  . CORONARY STENT INTERVENTION N/A 04/20/2019   Procedure: CORONARY STENT INTERVENTION;  Surgeon: Yolonda Kida, MD;  Location: [place] [facility];  Service: Cardiovascular;  Laterality: N/A;  . LEFT HEART CATH AND CORONARY ANGIOGRAPHY Left 04/20/2019   Procedure: LEFT  HEART CATH AND CORONARY ANGIOGRAPHY;  Surgeon: Dionisio David, MD;  Location: [facility];  Service: Cardiovascular;  Laterality: Left;    Prior to Admission medications   Medication Sig Start Date End Date Taking? Authorizing Provider  amiodarone (PACERONE) 400 MG tablet Take 1 tablet (400 mg total) by mouth daily. 04/21/19   Bettey Costa, MD  apixaban (ELIQUIS) 5 MG TABS tablet Take 1 tablet (5 mg total) by mouth 2 (two) times daily. 04/21/19   Bettey Costa, MD  clopidogrel (PLAVIX) 75 MG tablet Take 1 tablet (75 mg total) by mouth daily. 04/21/19   Bettey Costa, MD  fluticasone (FLONASE) 50 MCG/ACT nasal spray Place 2 sprays into the nose daily. 04/08/19 04/07/20  [provider]  metoprolol tartrate (LOPRESSOR) 25 MG tablet Take 25 mg by mouth 2 (two) times daily.    [provider]  pantoprazole (PROTONIX) 40 MG tablet Take 1 tablet (40 mg total) by mouth daily. 04/21/19   Bettey Costa, MD  lisinopril (PRINIVIL,ZESTRIL) 10 MG tablet Take 5 mg by mouth daily.   06/10/19  [provider]    Allergies Amoxicillin, Sulfa antibiotics, Pravastatin, and Rosuvastatin  No family history on file.  Social History Social History   Tobacco Use  . Smoking status: Current Every Day Smoker    Packs/day: 0.50    Types: Cigarettes  . Smokeless tobacco: Never Used  Substance Use Topics  .  Alcohol use: Yes  . Drug use: No    Review of Systems Constitutional: No fever/chills Eyes: No visual changes. ENT: No sore throat.  Reports the back of throat feels dry scratchy.  Sometimes little hard to swallow solid foods but after he drinks water it gets better Cardiovascular: Denies chest pain. Respiratory: Denies shortness of breath. Gastrointestinal: No abdominal pain.   Genitourinary: Negative for dysuria. Musculoskeletal: Negative for back pain. Skin: Negative for rash. Neurological: Negative for headaches, areas of focal weakness or numbness.     ____________________________________________   PHYSICAL EXAM:  VITAL SIGNS: ED Triage Vitals  Enc Vitals Group     BP 06/10/19 0814 (!) 118/53     Pulse Rate 06/10/19 0814 69     Resp 06/10/19 0814 18     Temp 06/10/19 0814 98.3 F (36.8 C)     Temp Source 06/10/19 0814 Oral     SpO2 06/10/19 0814 99 %     Weight 06/10/19 0815 230 lb (104.3 kg)     Height 06/10/19 0815 5\' 8"  (1.727 m)     Head Circumference --      Peak Flow --      Pain Score 06/10/19 0815 0     Pain Loc --      Pain Edu? --      Excl. in GC? --     Constitutional: Alert and oriented. Well appearing and in no acute distress.  He is extremely pleasant. Eyes: Conjunctivae are normal. Head: Atraumatic. Nose: No congestion/rhinnorhea. Mouth/Throat: Mucous membranes are moderately dry. Neck: No stridor.  Cardiovascular: Normal rate, regular rhythm. Grossly normal heart sounds.  Good peripheral circulation.  Wearing LifeVest device. Respiratory: Normal respiratory effort.  No retractions. Lungs CTAB. Gastrointestinal: Soft and nontender. No distention. Musculoskeletal: No lower extremity tenderness nor edema. Neurologic:  Normal speech and language. No gross focal neurologic deficits are appreciated.  Skin:  Skin is warm, dry and intact. No rash noted. Psychiatric: Mood and affect are normal. Speech and behavior are normal.  ____________________________________________   LABS (all labs ordered are listed, but only abnormal results are displayed)  Labs Reviewed  CBC - Abnormal; Notable for the following components:      Result Value   WBC 10.6 (*)    RBC 4.01 (*)    Hemoglobin 12.6 (*)    All other components within normal limits  BASIC METABOLIC PANEL - Abnormal; Notable for the following components:   Glucose, Bld 130 (*)    Creatinine, Ser 1.33 (*)    GFR calc non Af Amer 54 (*)    All other components within normal limits   ____________________________________________  EKG    ____________________________________________  RADIOLOGY   ____________________________________________   PROCEDURES  Procedure(s) performed: None  Procedures  Critical Care performed: No  ____________________________________________   INITIAL IMPRESSION / ASSESSMENT AND PLAN / ED COURSE  Pertinent labs & imaging results that were available during my care of the patient were reviewed by me and considered in my medical decision making (see chart for details).   Reviewed medications.  Mild hypotension.  I suspect patient dehydrated based on lab work and his clinical symptoms I think this is likely dryness in the back of the throat and the mouth is causing his difficulty with swallowing especially as he gets better if he drinks fluids and then swallows.  He has no evidence of oral pharyngeal edema.  The lingula is normal and the lips are normal and he is not seen any  swelling itching anywhere on the body  I will stop his lisinopril, if 1 of his medications wants to cause side effect I would suspect this and given his hypotension we will also stop this and he will talk to Dr. Lennette BihariKohn on Monday which she has appointment for  We will hydrate him.  Lab work reassurin, points toward dehydration  ----------------------------------------- 12:45 PM on 06/10/2019 -----------------------------------------  Patient doing well, feels well.  Not having ongoing lightheadedness.  Comfortable with careful return precautions advised him if he starts having swelling in his throat around the mouth or no swelling anywhere in the body or hives that he should come back right away or shortness of breath and he is comfortable with this.  We will stop his lisinopril and discussed with Dr. Lennette BihariKohn on Monday.     Return precautions and treatment recommendations and follow-up discussed with the patient who is agreeable with the plan.  ____________________________________________   FINAL CLINICAL IMPRESSION(S) /  ED DIAGNOSES  Final diagnoses:  Dehydration, mild  Globus sensation   Patricia PesaRobert E Eckels was evaluated in Emergency Department on 06/10/2019 for the symptoms described in the history of present illness. He was evaluated in the context of the global COVID-19 pandemic, which necessitated consideration that the patient might be at risk for infection with the SARS-CoV-2 virus that causes COVID-19. Institutional protocols and algorithms that pertain to the evaluation of patients at risk for COVID-19 are in a state of rapid change based on information released by regulatory bodies including the CDC and federal and state organizations. These policies and algorithms were followed during the patient's care in the ED.      Note:  This document was prepared using Conservation officer, historic buildingsDragon voice recognition software and may include unintentional dictation errors       Sharyn CreamerQuale, Mandie Crabbe, MD 06/10/19 1246    Sharyn CreamerQuale, Wesly Whisenant, MD 06/10/19 1246

## 2019-07-23 ENCOUNTER — Encounter: Payer: Self-pay | Admitting: Intensive Care

## 2019-07-23 ENCOUNTER — Emergency Department
Admission: EM | Admit: 2019-07-23 | Discharge: 2019-07-23 | Disposition: A | Discharge status: Home / Self Care | Payer: Medicare Other | Attending: Emergency Medicine | Admitting: Emergency Medicine

## 2019-07-23 ENCOUNTER — Other Ambulatory Visit: Payer: Self-pay

## 2019-07-23 DIAGNOSIS — Y9389 Activity, other specified: Secondary | ICD-10-CM | POA: Diagnosis not present

## 2019-07-23 DIAGNOSIS — Y998 Other external cause status: Secondary | ICD-10-CM | POA: Diagnosis not present

## 2019-07-23 DIAGNOSIS — Z79899 Other long term (current) drug therapy: Secondary | ICD-10-CM | POA: Diagnosis not present

## 2019-07-23 DIAGNOSIS — W228XXA Striking against or struck by other objects, initial encounter: Secondary | ICD-10-CM | POA: Diagnosis not present

## 2019-07-23 DIAGNOSIS — F17210 Nicotine dependence, cigarettes, uncomplicated: Secondary | ICD-10-CM | POA: Diagnosis not present

## 2019-07-23 DIAGNOSIS — Z955 Presence of coronary angioplasty implant and graft: Secondary | ICD-10-CM | POA: Diagnosis not present

## 2019-07-23 DIAGNOSIS — S51812A Laceration without foreign body of left forearm, initial encounter: Secondary | ICD-10-CM | POA: Diagnosis not present

## 2019-07-23 DIAGNOSIS — Z7901 Long term (current) use of anticoagulants: Secondary | ICD-10-CM | POA: Diagnosis not present

## 2019-07-23 DIAGNOSIS — I10 Essential (primary) hypertension: Secondary | ICD-10-CM | POA: Diagnosis not present

## 2019-07-23 DIAGNOSIS — Y92531 Health care provider office as the place of occurrence of the external cause: Secondary | ICD-10-CM | POA: Insufficient documentation

## 2019-07-23 DIAGNOSIS — S59912A Unspecified injury of left forearm, initial encounter: Secondary | ICD-10-CM | POA: Diagnosis present

## 2019-07-23 NOTE — ED Provider Notes (Signed)
[facility] Emergency Department Provider Note  ____________________________________________  Time seen: Approximately 8:34 AM  I have reviewed the triage vital signs and the nursing notes.   HISTORY  Chief Complaint Wound Check   HPI Roger [name] is a 73 y.o. male presenting to the emergency department for treatment and evaluation of skin tear laceration to the left forearm.  He states his arm on the desk at the doctor's office couple days ago and the area still oozing blood.  He is on Eliquis. He applied a bandaid and pressure without improvement.  Past Medical History:  Diagnosis Date  . Atrial fibrillation (HCC)   . Hypertension     Patient Active Problem List   Diagnosis Date Noted  . Ventricular fibrillation (HCC) 04/19/2019    Past Surgical History:  Procedure Laterality Date  . CORONARY STENT INTERVENTION N/A 04/20/2019   Procedure: CORONARY STENT INTERVENTION;  Surgeon: Alwyn Peaallwood, Dwayne D, MD;  Location: [facility] INVASIVE CV LAB;  Service: Cardiovascular;  Laterality: N/A;  . LEFT HEART CATH AND CORONARY ANGIOGRAPHY Left 04/20/2019   Procedure: LEFT HEART CATH AND CORONARY ANGIOGRAPHY;  Surgeon: Laurier NancyKhan, Shaukat A, MD;  Location: [facility] INVASIVE CV LAB;  Service: Cardiovascular;  Laterality: Left;    Prior to Admission medications   Medication Sig Start Date End Date Taking? Authorizing Provider  amiodarone (PACERONE) 400 MG tablet Take 1 tablet (400 mg total) by mouth daily. 04/21/19   Adrian SaranMody, Sital, MD  apixaban (ELIQUIS) 5 MG TABS tablet Take 1 tablet (5 mg total) by mouth 2 (two) times daily. 04/21/19   Adrian SaranMody, Sital, MD  clopidogrel (PLAVIX) 75 MG tablet Take 1 tablet (75 mg total) by mouth daily. 04/21/19   Adrian SaranMody, Sital, MD  fluticasone (FLONASE) 50 MCG/ACT nasal spray Place 2 sprays into the nose daily. 04/08/19 04/07/20  [provider]  metoprolol tartrate (LOPRESSOR) 25 MG tablet Take 25 mg by mouth 2 (two) times daily.    [provider]  pantoprazole (PROTONIX) 40 MG tablet Take 1 tablet (40 mg total) by mouth daily. 04/21/19   Adrian SaranMody, Sital, MD  lisinopril (PRINIVIL,ZESTRIL) 10 MG tablet Take 5 mg by mouth daily.   06/10/19  [provider]    Allergies Amoxicillin, Sulfa antibiotics, Pravastatin, and Rosuvastatin  History reviewed. No pertinent family history.  Social History Social History   Tobacco Use  . Smoking status: Current Every Day Smoker    Packs/day: 0.50    Types: Cigarettes  . Smokeless tobacco: Never Used  Substance Use Topics  . Alcohol use: Yes    Comment: rare  . Drug use: No    Review of Systems  Constitutional: Negative for fever. Respiratory: Negative for cough or shortness of breath.  Musculoskeletal: Negative for myalgias Skin: Positive for skin tear on left forearm. Neurological: Negative for numbness or paresthesias. ____________________________________________   PHYSICAL EXAM:  VITAL SIGNS: ED Triage Vitals  Enc Vitals Group     BP 07/23/19 0752 125/65     Pulse Rate 07/23/19 0749 66     Resp 07/23/19 0749 16     Temp 07/23/19 0749 98.3 F (36.8 C)     Temp Source 07/23/19 0749 Oral     SpO2 07/23/19 0749 97 %     Weight 07/23/19 0750 213 lb (96.6 kg)     Height 07/23/19 0750 5\' 8"  (1.727 m)     Head Circumference --      Peak Flow --      Pain Score 07/23/19  0750 0     Pain Loc --      Pain Edu? --      Excl. in [place]? --      Constitutional: Well appearing. Eyes: Conjunctivae are clear without discharge or drainage. Nose: No rhinorrhea noted. Mouth/Throat: Airway is patent.  Neck: No stridor. Unrestricted range of motion observed. Cardiovascular: Capillary refill is <3 seconds.  Respiratory: Respirations are even and unlabored.. Musculoskeletal: Unrestricted range of motion observed. Neurologic: Awake, alert, and oriented x 4.  Skin:  0.5cm skin tear on dorsal aspect of left forearm oozing blood.  ____________________________________________    LABS (all labs ordered are listed, but only abnormal results are displayed)  Labs Reviewed - No data to display ____________________________________________  EKG  Not indicated. ____________________________________________  RADIOLOGY  Not indicated. ____________________________________________   PROCEDURES  .Marland KitchenLaceration Repair  Date/Time: 07/23/2019 8:54 AM Performed by: Victorino Dike, FNP Authorized by: Victorino Dike, FNP   Consent:    Consent obtained:  Verbal   Consent given by:  Patient   Risks discussed:  Poor cosmetic result and poor wound healing Anesthesia (see MAR for exact dosages):    Anesthesia method:  None Laceration details:    Location:  Shoulder/arm   Shoulder/arm location:  L lower arm Repair type:    Repair type:  Simple Exploration:    Contaminated: no   Treatment:    Amount of cleaning:  Standard   Irrigation method:  Tap Skin repair:    Repair method: Surgicel. Post-procedure details:    Dressing: 2 x 2 and [place].   Patient tolerance of procedure:  Tolerated well, no immediate complications   ____________________________________________   INITIAL IMPRESSION / ASSESSMENT AND PLAN / ED COURSE  Roger [name] is a 73 y.o. male who presents the emergency department for wound care after hitting his arm on the countertop in the doctor's office on Friday.  He has change the bandage a couple times but the area continues to ooze.  Wound care was performed as above.  Patient was advised to leave the bandage in place until his follow-up appointment with his primary care provider tomorrow.  Medications - No data to display   Pertinent labs & imaging results that were available during my care of the patient were reviewed by me and considered in my medical decision making (see chart for details).  ____________________________________________   FINAL CLINICAL IMPRESSION(S) / ED DIAGNOSES  Final diagnoses:  Skin tear of forearm without  complication, left, initial encounter    ED Discharge Orders    None       Note:  This document was prepared using Dragon voice recognition software and may include unintentional dictation errors.   Victorino Dike, FNP 07/23/19 [id number]    Carrie Mew, MD 07/23/19 [phone]

## 2019-07-23 NOTE — ED Notes (Signed)
See triage note  Presents with a wound to left forearm  States hehit his arm couple of days ago  Area has cont's to have some bleeding

## 2019-07-23 NOTE — ED Triage Notes (Signed)
Patient was at his doctor office a couple days ago and when he was leaving bumped his arm on the table. Area still oozing blood. Patient takes eliquis daily

## 2019-08-26 ENCOUNTER — Emergency Department
Admission: EM | Admit: 2019-08-26 | Discharge: 2019-08-26 | Disposition: A | Discharge status: Home / Self Care | Payer: Medicare Other | Attending: Emergency Medicine | Admitting: Emergency Medicine

## 2019-08-26 ENCOUNTER — Other Ambulatory Visit: Payer: Self-pay

## 2019-08-26 ENCOUNTER — Encounter: Payer: Self-pay | Admitting: Emergency Medicine

## 2019-08-26 DIAGNOSIS — Z7902 Long term (current) use of antithrombotics/antiplatelets: Secondary | ICD-10-CM | POA: Diagnosis not present

## 2019-08-26 DIAGNOSIS — Z7901 Long term (current) use of anticoagulants: Secondary | ICD-10-CM | POA: Diagnosis not present

## 2019-08-26 DIAGNOSIS — K625 Hemorrhage of anus and rectum: Secondary | ICD-10-CM | POA: Diagnosis present

## 2019-08-26 DIAGNOSIS — K648 Other hemorrhoids: Secondary | ICD-10-CM | POA: Insufficient documentation

## 2019-08-26 DIAGNOSIS — Z955 Presence of coronary angioplasty implant and graft: Secondary | ICD-10-CM | POA: Diagnosis not present

## 2019-08-26 DIAGNOSIS — K649 Unspecified hemorrhoids: Secondary | ICD-10-CM

## 2019-08-26 DIAGNOSIS — F17210 Nicotine dependence, cigarettes, uncomplicated: Secondary | ICD-10-CM | POA: Insufficient documentation

## 2019-08-26 DIAGNOSIS — I10 Essential (primary) hypertension: Secondary | ICD-10-CM | POA: Insufficient documentation

## 2019-08-26 DIAGNOSIS — I4891 Unspecified atrial fibrillation: Secondary | ICD-10-CM | POA: Insufficient documentation

## 2019-08-26 LAB — COMPREHENSIVE METABOLIC PANEL
ALT: 10 U/L (ref 0–44)
AST: 13 U/L — ABNORMAL LOW (ref 15–41)
Albumin: 3.8 g/dL (ref 3.5–5.0)
Alkaline Phosphatase: 80 U/L (ref 38–126)
Anion gap: 10 (ref 5–15)
BUN: 19 mg/dL (ref 8–23)
CO2: 26 mmol/L (ref 22–32)
Calcium: 8.7 mg/dL — ABNORMAL LOW (ref 8.9–10.3)
Chloride: 103 mmol/L (ref 98–111)
Creatinine, Ser: 1.11 mg/dL (ref 0.61–1.24)
GFR calc Af Amer: >60 mL/min (ref >60)
GFR calc non Af Amer: >60 mL/min (ref >60)
Glucose, Bld: 118 mg/dL — ABNORMAL HIGH (ref 70–99)
Potassium: 3.2 mmol/L — ABNORMAL LOW (ref 3.5–5.1)
Sodium: 139 mmol/L (ref 135–145)
Total Bilirubin: 1.6 mg/dL — ABNORMAL HIGH (ref 0.3–1.2)
Total Protein: 6.8 g/dL (ref 6.5–8.1)

## 2019-08-26 LAB — CBC
HCT: 39.7 % (ref 39.0–52.0)
Hemoglobin: 12.5 g/dL — ABNORMAL LOW (ref 13.0–17.0)
MCH: 30.3 pg (ref 26.0–34.0)
MCHC: 31.5 g/dL (ref 30.0–36.0)
MCV: 96.1 fL (ref 80.0–100.0)
Platelets: 264 10*3/uL (ref 150–400)
RBC: 4.13 MIL/uL — ABNORMAL LOW (ref 4.22–5.81)
RDW: 14.4 % (ref 11.5–15.5)
WBC: 9.2 10*3/uL (ref 4.0–10.5)
nRBC: 0 % (ref 0.0–0.2)

## 2019-08-26 LAB — TYPE AND SCREEN
ABO/RH(D): O POS
Antibody Screen: NEGATIVE

## 2019-08-26 NOTE — ED Notes (Signed)
Pt verbalized understanding of discharge instructions. NAD at this time. 

## 2019-08-26 NOTE — ED Triage Notes (Signed)
Rectal bleeding today, bright red, patient on Eloquis.

## 2019-08-26 NOTE — ED Notes (Signed)
FIRST NURSE NOTE:  Pt reports rectal bleeding that started about 30 minutes PTA. Pt is taking Eliquis.

## 2019-08-26 NOTE — ED Provider Notes (Signed)
[facility] Emergency Department Provider Note  ____________________________________________  Time seen: Approximately 5:13 PM  I have reviewed the triage vital signs and the nursing notes.   HISTORY  Chief Complaint Rectal Bleeding    HPI Roger [name] is a 73 y.o. male who presents the emergency department concern for bleeding hemorrhoid.  Patient reports that he has a long history of hemorrhoids.  Patient has been on warfarin and now Eliquis for anticoagulation and he will intermittently have bleeding from his hemorrhoids.  Patient reports that he typically can control it within 2 to 3 minutes.  As this episode lasted longer than 10 minutes he became concerned and presents emergency department.  Patient is not lightheaded, dizzy.  No abdominal pain.  Patient reports it was bright red blood per rectum consistent with his previous hemorrhoids.  Patient denies any active bleeding at this time.           Past Medical History:  Diagnosis Date  . Atrial fibrillation ([place])   . Hypertension     Patient Active Problem List   Diagnosis Date Noted  . Ventricular fibrillation ([place]) 04/19/2019    Past Surgical History:  Procedure Laterality Date  . CORONARY STENT INTERVENTION N/A 04/20/2019   Procedure: CORONARY STENT INTERVENTION;  Surgeon: Yolonda Kida, MD;  Location: [place] CV LAB;  Service: Cardiovascular;  Laterality: N/A;  . LEFT HEART CATH AND CORONARY ANGIOGRAPHY Left 04/20/2019   Procedure: LEFT HEART CATH AND CORONARY ANGIOGRAPHY;  Surgeon: Dionisio David, MD;  Location: [place] CV LAB;  Service: Cardiovascular;  Laterality: Left;    Prior to Admission medications   Medication Sig Start Date End Date Taking? Authorizing Provider  amiodarone (PACERONE) 400 MG tablet Take 1 tablet (400 mg total) by mouth daily. 04/21/19   Bettey Costa, MD  apixaban (ELIQUIS) 5 MG TABS tablet Take 1 tablet (5 mg total) by mouth 2 (two) times daily.  04/21/19   Bettey Costa, MD  clopidogrel (PLAVIX) 75 MG tablet Take 1 tablet (75 mg total) by mouth daily. 04/21/19   Bettey Costa, MD  fluticasone (FLONASE) 50 MCG/ACT nasal spray Place 2 sprays into the nose daily. 04/08/19 04/07/20  [provider]  metoprolol tartrate (LOPRESSOR) 25 MG tablet Take 25 mg by mouth 2 (two) times daily.    [provider]  pantoprazole (PROTONIX) 40 MG tablet Take 1 tablet (40 mg total) by mouth daily. 04/21/19   Bettey Costa, MD  lisinopril (PRINIVIL,ZESTRIL) 10 MG tablet Take 5 mg by mouth daily.   06/10/19  [provider]    Allergies Amoxicillin, Sulfa antibiotics, Pravastatin, and Rosuvastatin  No family history on file.  Social History Social History   Tobacco Use  . Smoking status: Current Every Day Smoker    Packs/day: 0.50    Types: Cigarettes  . Smokeless tobacco: Never Used  Substance Use Topics  . Alcohol use: Yes    Comment: rare  . Drug use: No     Review of Systems  Constitutional: No fever/chills Eyes: No visual changes. No discharge ENT: No upper respiratory complaints. Cardiovascular: no chest pain. Respiratory: no cough. No SOB. Gastrointestinal: No abdominal pain.  No nausea, no vomiting.  No diarrhea.  No constipation.  Positive for rectal bleeding Musculoskeletal: Negative for musculoskeletal pain. Skin: Negative for rash, abrasions, lacerations, ecchymosis. Neurological: Negative for headaches, focal weakness or numbness. 10-point ROS otherwise negative.  ____________________________________________   PHYSICAL EXAM:  VITAL SIGNS: ED Triage Vitals  Enc Vitals  Group     BP 08/26/19 1511 (!) 129/57     Pulse Rate 08/26/19 1511 69     Resp 08/26/19 1511 18     Temp 08/26/19 1511 97.6 F (36.4 C)     Temp Source 08/26/19 1511 Oral     SpO2 08/26/19 1511 97 %     Weight 08/26/19 1517 206 lb (93.4 kg)     Height 08/26/19 1517 5\' 8"  (1.727 m)     Head Circumference --      Peak Flow --       Pain Score 08/26/19 1517 0     Pain Loc --      Pain Edu? --      Excl. in [place]? --      Constitutional: Alert and oriented. Well appearing and in no acute distress. Eyes: Conjunctivae are normal. PERRL. EOMI. Head: Atraumatic. ENT:      Ears:       Nose: No congestion/rhinnorhea.      Mouth/Throat: Mucous membranes are moist.  Neck: No stridor.    Cardiovascular: Normal rate, regular rhythm. Normal S1 and S2.  Good peripheral circulation. Respiratory: Normal respiratory effort without tachypnea or retractions. Lungs CTAB. Good air entry to the bases with no decreased or absent breath sounds. Gastrointestinal: Bowel sounds 4 quadrants. Soft and nontender to palpation. No guarding or rigidity. No palpable masses. No distention. No CVA tenderness.  Visualization of the perirectal region reveals dried blood but no active bleeding.  Examination reveals hemorrhoids in the 12 clock position.  No active bleeding from hemorrhoid.  No significant thrombosis. Musculoskeletal: Full range of motion to all extremities. No gross deformities appreciated. Neurologic:  Normal speech and language. No gross focal neurologic deficits are appreciated.  Skin:  Skin is warm, dry and intact. No rash noted. Psychiatric: Mood and affect are normal. Speech and behavior are normal. Patient exhibits appropriate insight and judgement.   ____________________________________________   LABS (all labs ordered are listed, but only abnormal results are displayed)  Labs Reviewed  COMPREHENSIVE METABOLIC PANEL - Abnormal; Notable for the following components:      Result Value   Potassium 3.2 (*)    Glucose, Bld 118 (*)    Calcium 8.7 (*)    AST 13 (*)    Total Bilirubin 1.6 (*)    All other components within normal limits  CBC - Abnormal; Notable for the following components:   RBC 4.13 (*)    Hemoglobin 12.5 (*)    All other components within normal limits  POC OCCULT BLOOD, ED  TYPE AND SCREEN    ____________________________________________  EKG   ____________________________________________  RADIOLOGY   No results found.  ____________________________________________    PROCEDURES  Procedure(s) performed:    Procedures    Medications - No data to display   ____________________________________________   INITIAL IMPRESSION / ASSESSMENT AND PLAN / ED COURSE  Pertinent labs & imaging results that were available during my care of the patient were reviewed by me and considered in my medical decision making (see chart for details).  Review of the [place] CSRS was performed in accordance of the [facility] prior to dispensing any controlled drugs.           Patient's diagnosis is consistent with hemorrhoids.  Patient presented to the emergency department after having a hemorrhoid hemorrhage earlier today.  Patient reports that he typically can stop the bleeding within 2 to 3 minutes.  This lasted approximately 10 minutes and patient was  concerned.  No active bleeding.  Visualization reveals hemorrhoids.  Again, patient has a longstanding history of intermittent bleeding from his hemorrhoids.  I discussed follow-up options to include general surgery for banding/ligation.  Patient is interested in following up with general surgery at this time.  There is no treatments needed at this time as hemorrhoid is no longer actively hemorrhaging..  Labs are reassuring at this time.  No further work-up.  Follow-up with general surgery or primary care as needed.  Patient is given ED precautions to return to the ED for any worsening or new symptoms.     ____________________________________________  FINAL CLINICAL IMPRESSION(S) / ED DIAGNOSES  Final diagnoses:  Bleeding hemorrhoid      NEW MEDICATIONS STARTED DURING THIS VISIT:  ED Discharge Orders    None          This chart was dictated using voice recognition software/Dragon. Despite best efforts to proofread, errors  can occur which can change the meaning. Any change was purely unintentional.    Racheal PatchesCuthriell, Jonathan D, PA-C 08/26/19 1718    Chesley NoonJessup, Charles, MD 08/27/19 [phone]

## 2019-08-31 ENCOUNTER — Emergency Department
Admission: EM | Admit: 2019-08-31 | Discharge: 2019-08-31 | Discharge status: Absent without leave | Payer: Medicare Other

## 2019-09-01 ENCOUNTER — Emergency Department: Payer: Medicare Other

## 2019-09-01 ENCOUNTER — Observation Stay
Admission: EM | Admit: 2019-09-01 | Discharge: 2019-09-03 | Disposition: A | Discharge status: Home / Self Care | Payer: Medicare Other | Attending: Internal Medicine | Admitting: Internal Medicine

## 2019-09-01 ENCOUNTER — Other Ambulatory Visit: Payer: Self-pay

## 2019-09-01 DIAGNOSIS — Z882 Allergy status to sulfonamides status: Secondary | ICD-10-CM | POA: Insufficient documentation

## 2019-09-01 DIAGNOSIS — I959 Hypotension, unspecified: Secondary | ICD-10-CM | POA: Diagnosis present

## 2019-09-01 DIAGNOSIS — E872 Acidosis: Secondary | ICD-10-CM | POA: Diagnosis not present

## 2019-09-01 DIAGNOSIS — Z888 Allergy status to other drugs, medicaments and biological substances status: Secondary | ICD-10-CM | POA: Diagnosis not present

## 2019-09-01 DIAGNOSIS — R339 Retention of urine, unspecified: Secondary | ICD-10-CM | POA: Diagnosis not present

## 2019-09-01 DIAGNOSIS — Z79899 Other long term (current) drug therapy: Secondary | ICD-10-CM | POA: Insufficient documentation

## 2019-09-01 DIAGNOSIS — Z7901 Long term (current) use of anticoagulants: Secondary | ICD-10-CM | POA: Insufficient documentation

## 2019-09-01 DIAGNOSIS — D62 Acute posthemorrhagic anemia: Secondary | ICD-10-CM | POA: Insufficient documentation

## 2019-09-01 DIAGNOSIS — N390 Urinary tract infection, site not specified: Secondary | ICD-10-CM

## 2019-09-01 DIAGNOSIS — Z20828 Contact with and (suspected) exposure to other viral communicable diseases: Secondary | ICD-10-CM | POA: Diagnosis not present

## 2019-09-01 DIAGNOSIS — I4819 Other persistent atrial fibrillation: Secondary | ICD-10-CM | POA: Diagnosis not present

## 2019-09-01 DIAGNOSIS — N200 Calculus of kidney: Secondary | ICD-10-CM | POA: Diagnosis not present

## 2019-09-01 DIAGNOSIS — F17210 Nicotine dependence, cigarettes, uncomplicated: Secondary | ICD-10-CM | POA: Insufficient documentation

## 2019-09-01 DIAGNOSIS — Z880 Allergy status to penicillin: Secondary | ICD-10-CM | POA: Insufficient documentation

## 2019-09-01 DIAGNOSIS — R319 Hematuria, unspecified: Secondary | ICD-10-CM

## 2019-09-01 DIAGNOSIS — N210 Calculus in bladder: Secondary | ICD-10-CM | POA: Insufficient documentation

## 2019-09-01 DIAGNOSIS — N401 Enlarged prostate with lower urinary tract symptoms: Secondary | ICD-10-CM | POA: Diagnosis not present

## 2019-09-01 DIAGNOSIS — Z955 Presence of coronary angioplasty implant and graft: Secondary | ICD-10-CM | POA: Diagnosis not present

## 2019-09-01 DIAGNOSIS — I10 Essential (primary) hypertension: Secondary | ICD-10-CM | POA: Insufficient documentation

## 2019-09-01 DIAGNOSIS — E785 Hyperlipidemia, unspecified: Secondary | ICD-10-CM | POA: Insufficient documentation

## 2019-09-01 LAB — COMPREHENSIVE METABOLIC PANEL
ALT: 8 U/L (ref 0–44)
AST: 17 U/L (ref 15–41)
Albumin: 3.5 g/dL (ref 3.5–5.0)
Alkaline Phosphatase: 62 U/L (ref 38–126)
Anion gap: 13 (ref 5–15)
BUN: 24 mg/dL — ABNORMAL HIGH (ref 8–23)
CO2: 24 mmol/L (ref 22–32)
Calcium: 8.6 mg/dL — ABNORMAL LOW (ref 8.9–10.3)
Chloride: 103 mmol/L (ref 98–111)
Creatinine, Ser: 1.18 mg/dL (ref 0.61–1.24)
GFR calc Af Amer: >60 mL/min (ref >60)
GFR calc non Af Amer: >60 mL/min (ref >60)
Glucose, Bld: 169 mg/dL — ABNORMAL HIGH (ref 70–99)
Potassium: 3.9 mmol/L (ref 3.5–5.1)
Sodium: 140 mmol/L (ref 135–145)
Total Bilirubin: 1.8 mg/dL — ABNORMAL HIGH (ref 0.3–1.2)
Total Protein: 6.4 g/dL — ABNORMAL LOW (ref 6.5–8.1)

## 2019-09-01 LAB — CBC WITH DIFFERENTIAL/PLATELET
Abs Immature Granulocytes: 0.05 10*3/uL (ref 0.00–0.07)
Basophils Absolute: 0.1 10*3/uL (ref 0.0–0.1)
Basophils Relative: 1 %
Eosinophils Absolute: 0 10*3/uL (ref 0.0–0.5)
Eosinophils Relative: 0 %
HCT: 33.7 % — ABNORMAL LOW (ref 39.0–52.0)
Hemoglobin: 10.8 g/dL — ABNORMAL LOW (ref 13.0–17.0)
Immature Granulocytes: 1 %
Lymphocytes Relative: 8 %
Lymphs Abs: 0.8 10*3/uL (ref 0.7–4.0)
MCH: 30.3 pg (ref 26.0–34.0)
MCHC: 32 g/dL (ref 30.0–36.0)
MCV: 94.4 fL (ref 80.0–100.0)
Monocytes Absolute: 0.4 10*3/uL (ref 0.1–1.0)
Monocytes Relative: 4 %
Neutro Abs: 8.7 10*3/uL — ABNORMAL HIGH (ref 1.7–7.7)
Neutrophils Relative %: 86 %
Platelets: 252 10*3/uL (ref 150–400)
RBC: 3.57 MIL/uL — ABNORMAL LOW (ref 4.22–5.81)
RDW: 14.6 % (ref 11.5–15.5)
WBC: 9.9 10*3/uL (ref 4.0–10.5)
nRBC: 0 % (ref 0.0–0.2)

## 2019-09-01 LAB — URINALYSIS, COMPLETE (UACMP) WITH MICROSCOPIC
Bacteria, UA: NONE SEEN
Bilirubin Urine: NEGATIVE
Glucose, UA: NEGATIVE mg/dL
Ketones, ur: NEGATIVE mg/dL
Nitrite: NEGATIVE
Protein, ur: 100 mg/dL — AB
RBC / HPF: >50 RBC/hpf — ABNORMAL HIGH (ref 0–5)
Specific Gravity, Urine: 1.014 (ref 1.005–1.030)
Squamous Epithelial / HPF: NONE SEEN (ref 0–5)
pH: 6 (ref 5.0–8.0)

## 2019-09-01 LAB — LACTIC ACID, PLASMA
Lactic Acid, Venous: 2.3 mmol/L (ref 0.5–1.9)
Lactic Acid, Venous: 1.7 mmol/L (ref 0.5–1.9)

## 2019-09-01 LAB — TROPONIN I (HIGH SENSITIVITY)
Troponin I (High Sensitivity): 6 ng/L (ref <18)
Troponin I (High Sensitivity): 6 ng/L (ref <18)

## 2019-09-01 IMAGING — CT CT ABD-PELV W/ CM
2 of 5 series · 16 of 46 positions shown, 18 images · IV contrast (APPLIED)
Comparison: None.

CLINICAL DATA: Abdominal distension, hematuria

EXAM:
CT ABDOMEN AND PELVIS WITH CONTRAST
TECHNIQUE: Multidetector CT imaging of the abdomen and pelvis was performed
using the standard protocol following bolus administration of
intravenous contrast.
CONTRAST:  100mL OMNIPAQUE IOHEXOL 300 MG/ML  SOLN

[Series 2: routine abd/pel with · axial · 0.77mm/px · z∈[-896,-506]mm · 13 of 88 slices shown, 15 images]
[im 5/88  soft-tissue]
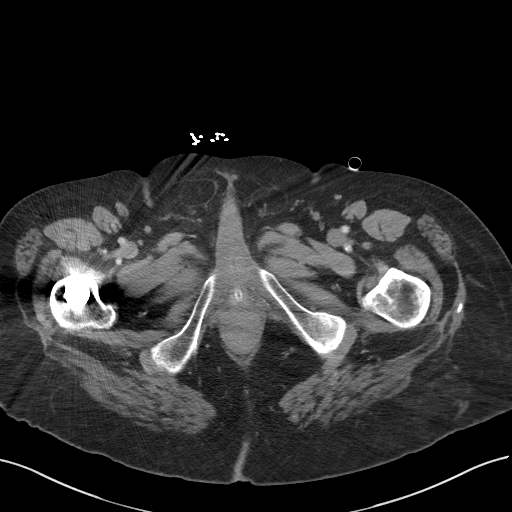
[im 5/88  bone]
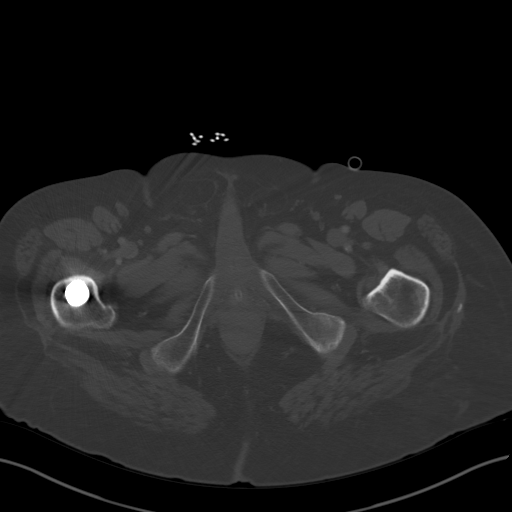
[im 14/88  soft-tissue]
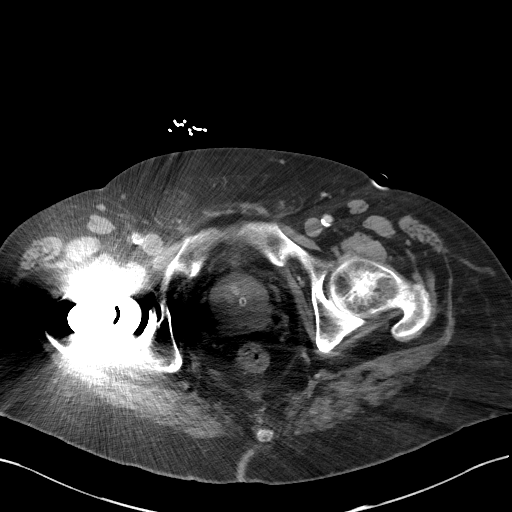
[im 19/88  soft-tissue]
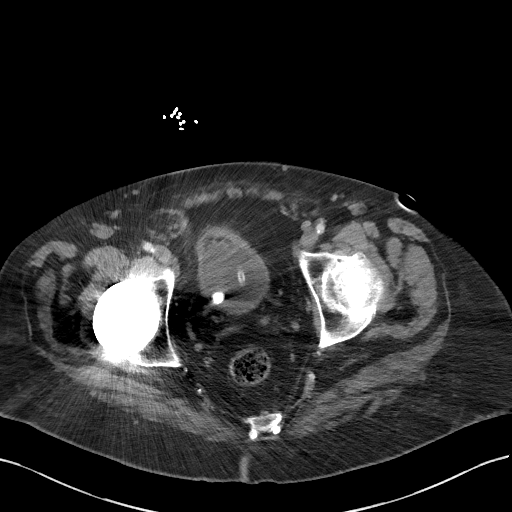
[im 23/88  soft-tissue]
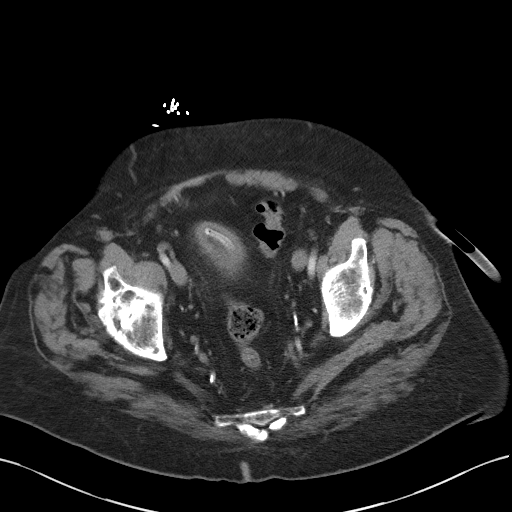
[im 33/88  soft-tissue]
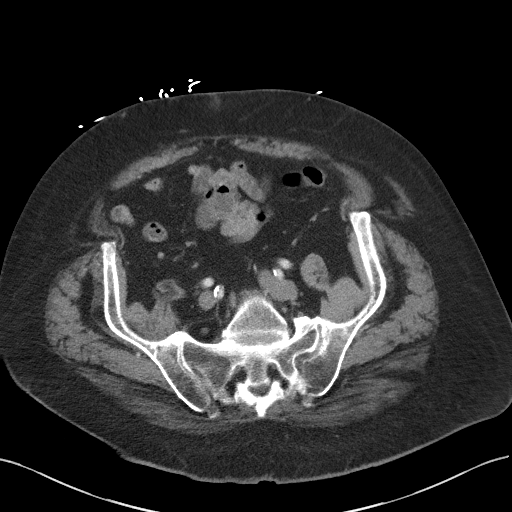
[im 37/88  soft-tissue]
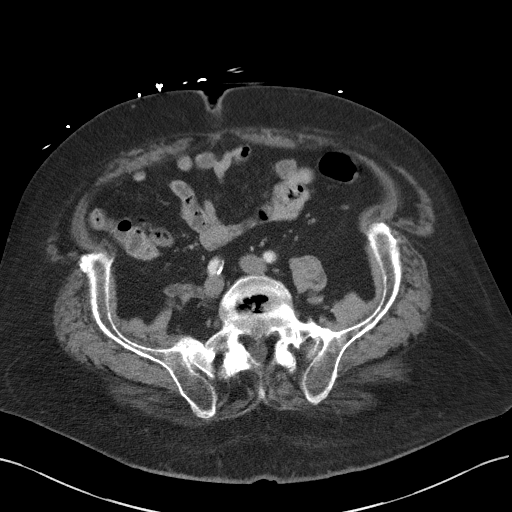
[im 46/88  soft-tissue]
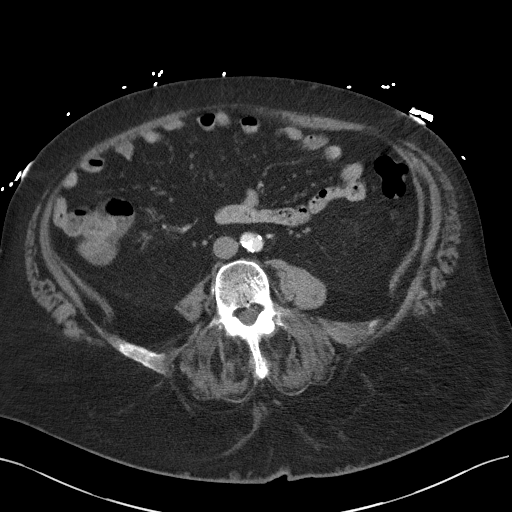
[im 51/88  soft-tissue]
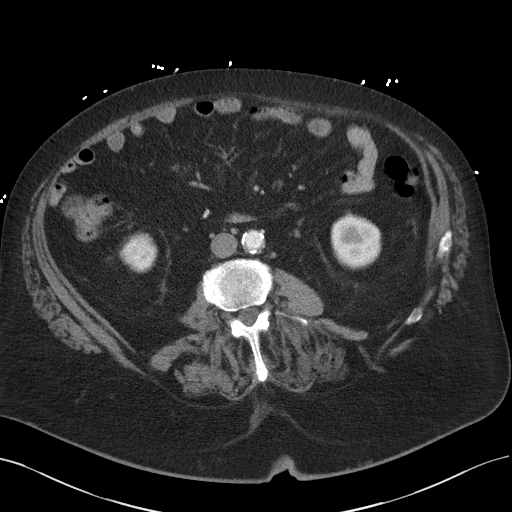
[im 55/88  soft-tissue]
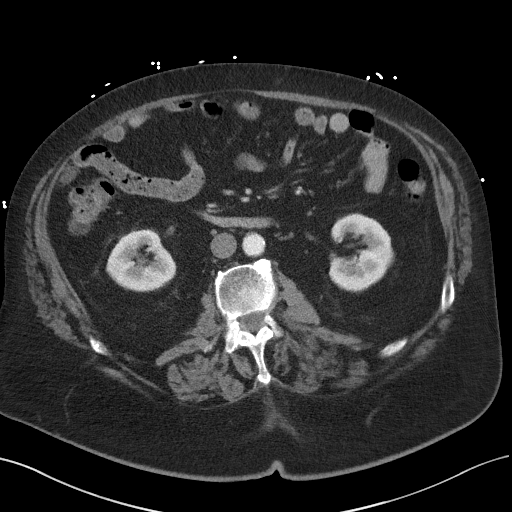
[im 55/88  bone]
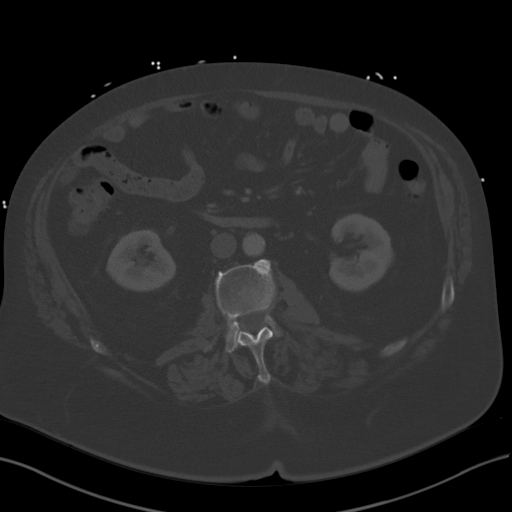
[im 65/88  soft-tissue]
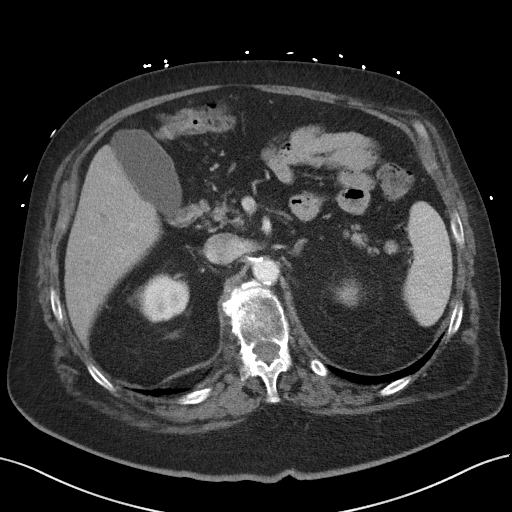
[im 69/88  soft-tissue]
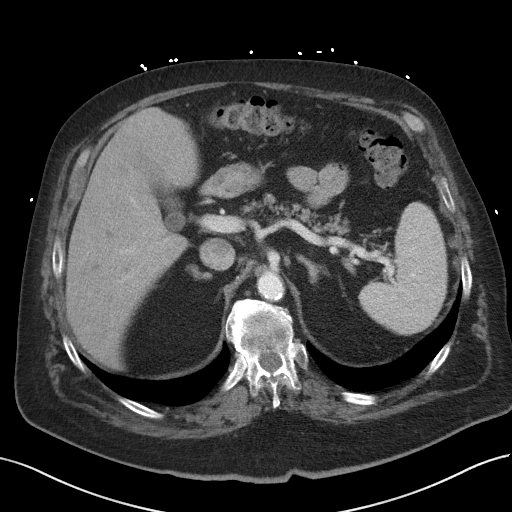
[im 74/88  soft-tissue]
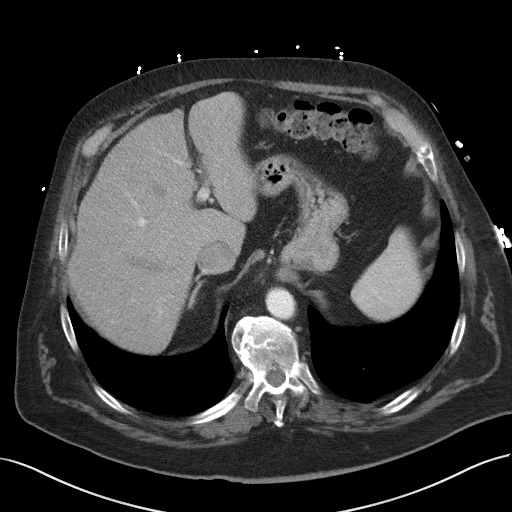
[im 83/88  soft-tissue]
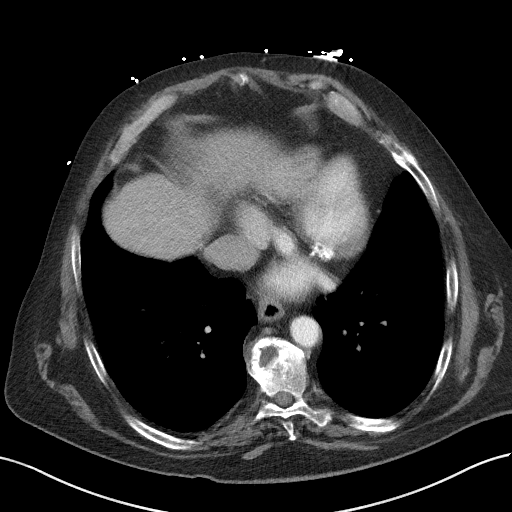

[Series 5: coronal st · coronal · 0.73mm/px · 3 of 113 slices shown]
[im 38/113  soft-tissue]
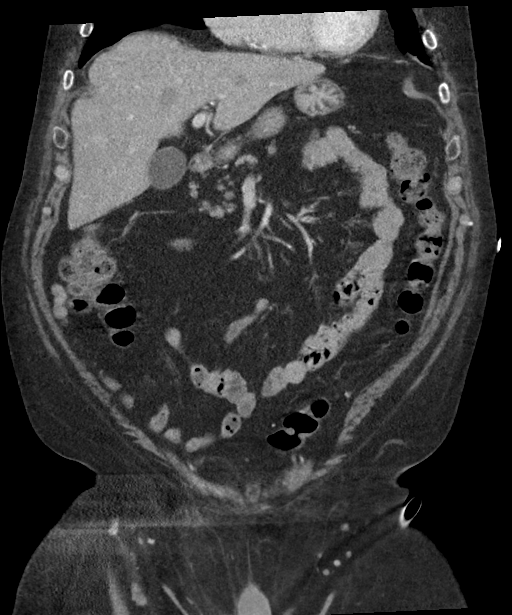
[im 50/113  soft-tissue]
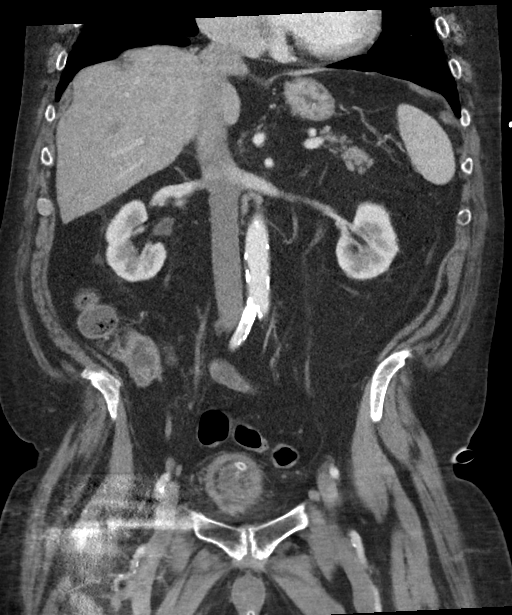
[im 63/113  soft-tissue]
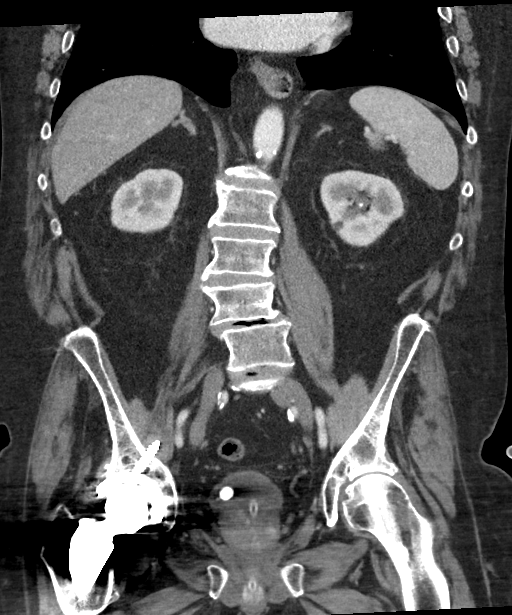

[16 of 46 positions shown; findings below may reference images not displayed]

FINDINGS: Lower chest: No acute abnormality.

Hepatobiliary: No focal liver abnormality is seen. No gallstones,
gallbladder wall thickening, or biliary dilatation.

Pancreas: Unremarkable. No pancreatic ductal dilatation or
surrounding inflammatory changes.

Spleen: Normal in size without focal abnormality. Small splenule at
the splenic hilum.

Adrenals/Urinary Tract: Unremarkable adrenals. 3 mm nonobstructing
calculus within the superior calyx of the left kidney. Kidneys are
otherwise unremarkable without hydronephrosis. Ureters are
nondilated. Rounded 10 x 9 mm calculus within the urinary bladder in
the region of the right ureterovesical junction within additional 3
mm calculus adjacent to it. Urinary bladder is partially
decompressed by Foley catheter. There is circumferential bladder
wall thickening and mild surrounding fat stranding.

Stomach/Bowel: Small hiatal hernia. Stomach and bowel unremarkable.
No dilated loops of bowel. No focal bowel wall thickening or
inflammatory changes. A normal appendix is located in the right
lower quadrant.

Vascular/Lymphatic: Aortic atherosclerosis. No enlarged abdominal or
pelvic lymph nodes.

Reproductive: Prostate is unremarkable.

Other: Small fat containing right inguinal hernia and umbilical
hernia. No ascites.

Musculoskeletal: Prior right total hip arthroplasty, partially
visualized. Degenerative changes of the left hip. Dextrocurvature of
the lumbar spine with mild right lateral listhesis L4 on L5. No
acute osseous findings.
IMPRESSION: 1. There is a 10 x 9 mm calculus within the urinary bladder in the
region of the right UVJ without resultant ureteral dilation or
hydronephrosis. If clinically indicated, a repeat CT of the pelvis
in the prone position could be obtained to determine if the calculus
is still partially located within the UVJ.
2. Circumferential thickening of the urinary bladder wall with mild
associated fat stranding. Correlate with urinalysis to assess for
cystitis.
3. Nonobstructing 3 mm left renal calculus.
4. Small hiatal hernia.

## 2019-09-01 MED ORDER — SODIUM CHLORIDE 0.9 % IV SOLN
INTRAVENOUS | Status: DC
  Administered 2019-09-01 – 2019-09-02 (×2): via INTRAVENOUS

## 2019-09-01 MED ORDER — ONDANSETRON HCL 4 MG/2ML IJ SOLN
4.0000 mg | Freq: Four times a day (QID) | INTRAMUSCULAR | Status: DC | PRN
  Filled 2019-09-01: qty 2

## 2019-09-01 MED ORDER — LEVOFLOXACIN IN D5W 500 MG/100ML IV SOLN
500.0000 mg | Freq: Once | INTRAVENOUS | Status: AC
  Administered 2019-09-01: 13:00:00 500 mg via INTRAVENOUS
  Filled 2019-09-01: qty 100

## 2019-09-01 MED ORDER — PANTOPRAZOLE SODIUM 40 MG PO TBEC
40.0000 mg | DELAYED_RELEASE_TABLET | Freq: Every day | ORAL | Status: DC
  Administered 2019-09-02 – 2019-09-03 (×2): 40 mg via ORAL
  Filled 2019-09-01 (×2): qty 1

## 2019-09-01 MED ORDER — FLUTICASONE PROPIONATE 50 MCG/ACT NA SUSP
2.0000 | Freq: Every day | NASAL | Status: DC | PRN
  Filled 2019-09-01: qty 16

## 2019-09-01 MED ORDER — EZETIMIBE 10 MG PO TABS
10.0000 mg | ORAL_TABLET | Freq: Every day | ORAL | Status: DC
  Administered 2019-09-02 – 2019-09-03 (×2): 10 mg via ORAL
  Filled 2019-09-01 (×3): qty 1

## 2019-09-01 MED ORDER — APIXABAN 5 MG PO TABS
5.0000 mg | ORAL_TABLET | Freq: Two times a day (BID) | ORAL | Status: DC
  Administered 2019-09-01 – 2019-09-03 (×4): 5 mg via ORAL
  Filled 2019-09-01 (×4): qty 1

## 2019-09-01 MED ORDER — ONDANSETRON HCL 4 MG PO TABS: 4.0000 mg | ORAL_TABLET | Freq: Four times a day (QID) | ORAL | Status: DC | PRN

## 2019-09-01 MED ORDER — IOHEXOL 300 MG/ML  SOLN
100.0000 mL | Freq: Once | INTRAMUSCULAR | Status: AC | PRN
  Administered 2019-09-01: 100 mL via INTRAVENOUS

## 2019-09-01 MED ORDER — SODIUM CHLORIDE 0.9 % IV BOLUS
1000.0000 mL | Freq: Once | INTRAVENOUS | Status: AC
  Administered 2019-09-01: 13:00:00 1000 mL via INTRAVENOUS

## 2019-09-01 MED ORDER — TAMSULOSIN HCL 0.4 MG PO CAPS
0.4000 mg | ORAL_CAPSULE | Freq: Every day | ORAL | Status: DC
  Administered 2019-09-01 – 2019-09-03 (×3): 0.4 mg via ORAL
  Filled 2019-09-01 (×3): qty 1

## 2019-09-01 MED ORDER — ACETAMINOPHEN 650 MG RE SUPP: 650.0000 mg | Freq: Four times a day (QID) | RECTAL | Status: DC | PRN

## 2019-09-01 MED ORDER — ATORVASTATIN CALCIUM 10 MG PO TABS
10.0000 mg | ORAL_TABLET | Freq: Every day | ORAL | Status: DC
  Administered 2019-09-02 – 2019-09-03 (×2): 10 mg via ORAL
  Filled 2019-09-01 (×2): qty 1

## 2019-09-01 MED ORDER — METOPROLOL TARTRATE 25 MG PO TABS
25.0000 mg | ORAL_TABLET | Freq: Two times a day (BID) | ORAL | Status: DC
  Administered 2019-09-01 – 2019-09-02 (×2): 25 mg via ORAL
  Filled 2019-09-01 (×2): qty 1

## 2019-09-01 MED ORDER — SODIUM CHLORIDE 0.9 % IV BOLUS
1000.0000 mL | Freq: Once | INTRAVENOUS | Status: AC
  Administered 2019-09-01: 12:00:00 1000 mL via INTRAVENOUS

## 2019-09-01 MED ORDER — VITAMIN D (ERGOCALCIFEROL) 1.25 MG (50000 UNIT) PO CAPS
50000.0000 [IU] | ORAL_CAPSULE | ORAL | Status: DC
  Administered 2019-09-03: 50000 [IU] via ORAL
  Filled 2019-09-01: qty 1

## 2019-09-01 MED ORDER — AMIODARONE HCL 200 MG PO TABS
400.0000 mg | ORAL_TABLET | Freq: Every day | ORAL | Status: DC
  Administered 2019-09-01 – 2019-09-03 (×3): 400 mg via ORAL
  Filled 2019-09-01 (×3): qty 2

## 2019-09-01 MED ORDER — ACETAMINOPHEN 325 MG PO TABS: 650.0000 mg | ORAL_TABLET | Freq: Four times a day (QID) | ORAL | Status: DC | PRN

## 2019-09-01 MED ORDER — POLYETHYLENE GLYCOL 3350 17 G PO PACK: 17.0000 g | PACK | Freq: Every day | ORAL | Status: DC | PRN

## 2019-09-01 NOTE — ED Triage Notes (Signed)
Pt c/o noticing blood in his urinary catheter yesterday. States it was placed on Monday. No other issues

## 2019-09-01 NOTE — ED Notes (Signed)
Date and time results received: 09/01/19 12:17 PM (use smartphrase ".now" to insert current time)  Test: Lactic Acid  Critical Value: 2.3  Name of Provider Notified: Rip Harbour  Orders Received? Or Actions Taken?: No new orders at this time

## 2019-09-01 NOTE — ED Notes (Signed)
ED Provider at bedside. 

## 2019-09-01 NOTE — H&P (Signed)
[employer] [facility] [place] [facility]   PATIENT NAME: Roger BerryRobert [name]    MR#:  [id number]  DATE OF BIRTH:  [birth year]  DATE OF ADMISSION:  09/01/2019  PRIMARY CARE PHYSICIAN: Lyndon CodeKhan, Fozia M, MD   REQUESTING/REFERRING PHYSICIAN: Dorothea GlassmanPaul Malinda, MD  CHIEF COMPLAINT:   Chief Complaint  Patient presents with  . Hematuria    HISTORY OF PRESENT ILLNESS:  Roger BerryRobert [name]  is a 73 y.o. male with a known history of hypertension and atrial fibrillation on Eliquis who presented to the ED with hematuria that started this morning.  Patient states that he had a Foley placed on Monday while he was in [place] visiting his brother.  He says he all of a sudden stopped peeing and does not know why.  This is never happened to him before.  He had a Foley catheter placed in the ED and was advised to follow-up with urology as an outpatient.  He has an appointment scheduled on Monday.  He states he has had repeated issues with the Foley catheter and ended up going to the ED in [place] 6 times due to issues with the tubing.  He denies any fevers, chills, abdominal pain, flank pain.  In the ED, he was initially hypotensive with BP 87/43, but his pressures improved to 115/71 with fluids. He was in atrial fibrillation, but was rate-controlled. Labs were significant for hemoglobin 10.8. Lactic acid was initially 2.3, but improved to 1.7 with fluids. UA with large hemoglobin, small leukocytosis, and no bacteria. CXR was unremarkable. CT abdomen/pelvis showed a 10 x 9 mm calculus in the urinary bladder in the region of the right UVJ without ureteral dilation or hydronephrosis, as well as circumferential thickening of the urinary bladder. He was given levaquin for possible UTI. Hospitalists were called for admission.  PAST MEDICAL HISTORY:   Past Medical History:  Diagnosis Date  . Atrial fibrillation (HCC)   . Hypertension     PAST SURGICAL HISTORY:   Past Surgical History:  Procedure Laterality  Date  . CORONARY STENT INTERVENTION N/A 04/20/2019   Procedure: CORONARY STENT INTERVENTION;  Surgeon: Alwyn Peaallwood, Dwayne D, MD;  Location: [facility] INVASIVE CV LAB;  Service: Cardiovascular;  Laterality: N/A;  . LEFT HEART CATH AND CORONARY ANGIOGRAPHY Left 04/20/2019   Procedure: LEFT HEART CATH AND CORONARY ANGIOGRAPHY;  Surgeon: Laurier NancyKhan, Shaukat A, MD;  Location: [facility] INVASIVE CV LAB;  Service: Cardiovascular;  Laterality: Left;    SOCIAL HISTORY:   Social History   Tobacco Use  . Smoking status: Current Every Day Smoker    Packs/day: 0.50    Types: Cigarettes  . Smokeless tobacco: Never Used  Substance Use Topics  . Alcohol use: Yes    Comment: rare    FAMILY HISTORY:  No family history on file.  DRUG ALLERGIES:   Allergies  Allergen Reactions  . Amoxicillin Anaphylaxis  . Sulfa Antibiotics Anaphylaxis  . Pravastatin Other (See Comments)  . Rosuvastatin Other (See Comments)    REVIEW OF SYSTEMS:   Review of Systems  Constitutional: Negative for chills and fever.  HENT: Negative for congestion and sore throat.   Eyes: Negative for blurred vision and double vision.  Respiratory: Negative for cough and shortness of breath.   Cardiovascular: Negative for chest pain.  Gastrointestinal: Negative for abdominal pain, nausea and vomiting.  Genitourinary: Positive for hematuria. Negative for dysuria, flank pain and urgency.  Musculoskeletal: Positive for back pain. Negative for neck pain.  Neurological: Negative for dizziness and headaches.  Psychiatric/Behavioral: Negative for depression. The patient is not nervous/anxious.     MEDICATIONS AT HOME:   Prior to Admission medications   Medication Sig Start Date End Date Taking? Authorizing Provider  amiodarone (PACERONE) 400 MG tablet Take 1 tablet (400 mg total) by mouth daily. 04/21/19  Yes Mody, [name], MD  apixaban (ELIQUIS) 5 MG TABS tablet Take 1 tablet (5 mg total) by mouth 2 (two) times daily. 04/21/19  Yes Mody, [name], MD   atorvastatin (LIPITOR) 10 MG tablet Take 10 mg by mouth daily. 05/22/19  Yes [provider]  Cholecalciferol (VITAMIN D3) 1.25 MG (50000 UT) CAPS Take 50,000 Units by mouth once a week. 05/02/19  Yes [provider]  ezetimibe (ZETIA) 10 MG tablet Take 10 mg by mouth daily. 08/13/19  Yes [provider]  furosemide (LASIX) 20 MG tablet Take 20 mg by mouth daily. 08/13/19  Yes [provider]  losartan (COZAAR) 25 MG tablet Take 25 mg by mouth daily. 08/13/19  Yes [provider]  metoprolol tartrate (LOPRESSOR) 25 MG tablet Take 25 mg by mouth 2 (two) times daily.   Yes [provider]  pantoprazole (PROTONIX) 40 MG tablet Take 1 tablet (40 mg total) by mouth daily. 04/21/19  Yes Mody, [name], MD  spironolactone (ALDACTONE) 25 MG tablet Take 25 mg by mouth daily. 08/13/19  Yes [provider]  tamsulosin (FLOMAX) 0.4 MG CAPS capsule Take 0.4 mg by mouth daily. 08/29/19  Yes [provider]  clopidogrel (PLAVIX) 75 MG tablet Take 1 tablet (75 mg total) by mouth daily. Patient not taking: Reported on 09/01/2019 04/21/19   Adrian Saran, MD  fluticasone (FLONASE) 50 MCG/ACT nasal spray Place 2 sprays into the nose daily. 04/08/19 04/07/20  [provider]  lisinopril (PRINIVIL,ZESTRIL) 10 MG tablet Take 5 mg by mouth daily.   06/10/19  [provider]      VITAL SIGNS:  Blood pressure 115/71, pulse 75, temperature 98.4 F (36.9 C), temperature source Oral, resp. rate 19, height  (1.727 m), weight 93.4 kg, SpO2 100 %.  PHYSICAL EXAMINATION:  Physical Exam  GENERAL:  73 y.o.-year-old patient lying in the bed with no acute distress.  EYES: Pupils equal, round, reactive to light and accommodation. No scleral icterus. Extraocular muscles intact.  HEENT: Head atraumatic, normocephalic. Oropharynx and nasopharynx clear.  NECK:  Supple, no jugular venous distention. No thyroid enlargement, no tenderness.  LUNGS: Normal breath  sounds bilaterally, no wheezing, rales,rhonchi or crepitation. No use of accessory muscles of respiration.  CARDIOVASCULAR: Irregularly irregular rhythm, regular rate, S1, S2 normal. No murmurs, rubs, or gallops.  ABDOMEN: Soft, nontender, nondistended. Bowel sounds present. No organomegaly or mass. No suprapubic tenderness GENITOURINARY: Foley catheter in place with brown/red stool in the foley bag. BACK: No CVA tenderness EXTREMITIES: No pedal edema, cyanosis, or clubbing.  NEUROLOGIC: Cranial nerves II through XII are intact. Muscle strength 5/5 in all extremities. Sensation intact. Gait not checked.  PSYCHIATRIC: The patient is alert and oriented x 3.  SKIN: No obvious rash, lesion, or ulcer.   LABORATORY PANEL:   CBC Recent Labs  Lab 09/01/19 1126  WBC 9.9  HGB 10.8*  HCT 33.7*  PLT 252   ------------------------------------------------------------------------------------------------------------------  Chemistries  Recent Labs  Lab 09/01/19 1126  NA 140  K 3.9  CL 103  CO2 24  GLUCOSE 169*  BUN 24*  CREATININE 1.18  CALCIUM 8.6*  AST 17  ALT 8  ALKPHOS 62  BILITOT 1.8*   ------------------------------------------------------------------------------------------------------------------  Cardiac Enzymes No results for input(s): TROPONINI in the last 168 hours. ------------------------------------------------------------------------------------------------------------------  RADIOLOGY:  Ct Abdomen Pelvis W Contrast  Result Date: 09/01/2019 CLINICAL DATA:  Abdominal distension, hematuria EXAM: CT ABDOMEN AND PELVIS WITH CONTRAST TECHNIQUE: Multidetector CT imaging of the abdomen and pelvis was performed using the standard protocol following bolus administration of intravenous contrast. CONTRAST:  147mL OMNIPAQUE IOHEXOL 300 MG/ML  SOLN COMPARISON:  None. FINDINGS: Lower chest: No acute abnormality. Hepatobiliary: No focal liver abnormality is seen. No gallstones,  gallbladder wall thickening, or biliary dilatation. Pancreas: Unremarkable. No pancreatic ductal dilatation or surrounding inflammatory changes. Spleen: Normal in size without focal abnormality. Small splenule at the splenic hilum. Adrenals/Urinary Tract: Unremarkable adrenals. 3 mm nonobstructing calculus within the superior calyx of the left kidney. Kidneys are otherwise unremarkable without hydronephrosis. Ureters are nondilated. Rounded 10 x 9 mm calculus within the urinary bladder in the region of the right ureterovesical junction within additional 3 mm calculus adjacent to it. Urinary bladder is partially decompressed by Foley catheter. There is circumferential bladder wall thickening and mild surrounding fat stranding. Stomach/Bowel: Small hiatal hernia. Stomach and bowel unremarkable. No dilated loops of bowel. No focal bowel wall thickening or inflammatory changes. A normal appendix is located in the right lower quadrant. Vascular/Lymphatic: Aortic atherosclerosis. No enlarged abdominal or pelvic lymph nodes. Reproductive: Prostate is unremarkable. Other: Small fat containing right inguinal hernia and umbilical hernia. No ascites. Musculoskeletal: Prior right total hip arthroplasty, partially visualized. Degenerative changes of the left hip. Dextrocurvature of the lumbar spine with mild right lateral listhesis L4 on L5. No acute osseous findings. IMPRESSION: 1. There is a 10 x 9 mm calculus within the urinary bladder in the region of the right UVJ without resultant ureteral dilation or hydronephrosis. If clinically indicated, a repeat CT of the pelvis in the prone position could be obtained to determine if the calculus is still partially located within the UVJ. 2. Circumferential thickening of the urinary bladder wall with mild associated fat stranding. Correlate with urinalysis to assess for cystitis. 3. Nonobstructing 3 mm left renal calculus. 4. Small hiatal hernia. Electronically Signed   By: Davina Poke M.D.   On: 09/01/2019 13:40   Dg Chest Portable 1 View  Result Date: 09/01/2019 CLINICAL DATA:  Pt states noticing blood in his urinary catheter yesterday. Hx of hypertension, atrial fibrillation. Current smoker. EXAM: PORTABLE CHEST 1 VIEW COMPARISON:  Chest radiograph 04/19/2019, 11/25/2016 FINDINGS: Stable cardiomediastinal contours with enlarged heart size. Redemonstrated narrowing of the upper trachea. The lungs are clear. No pneumothorax or large pleural effusion. No acute finding in the visualized skeleton. IMPRESSION: 1.  No acute cardiopulmonary process. 2. Redemonstrated narrowing of the upper trachea as seen on prior studies. Electronically Signed   By: Audie Pinto M.D.   On: 09/01/2019 12:16      IMPRESSION AND PLAN:   Hypotension- may be due to mild hydration vs BP meds, lasix, or flomax. No signs of infection. BP has returned to normal with IVFs. -Will hold home lasix, losartan, and spironolactone and see what his BPs do -Continue home amiodarone and metoprolol -Continue gentle IVFs  Hematuria- patient had acute urinary retention 5 days ago and had a foley catheter placed. -CT abdomen/pelvis with 10 x 9 mm calculus in the urinary bladder in the region of the UVJ without ureteral dilation or hydronephrosis -Urology consult -Continue flomax -Will not give any more antibiotics for possible UTI- patient is asymptomatic and UA is not consistent with UTI -Follow-up urine culture  Acute blood loss anemia- likely due to hematuria. Hemoglobin has dropped a little bit from baseline. -Recheck CBC in the morning -Continue eliquis for now  Lactic acidosis- resolved with IV fluids. -Monitor  Persistent atrial fibrillation- rate-controlled here -Continue home amiodarone  Hyperlipidemia- stable -Continue home lipitor and zetia  All the records are reviewed and case discussed with ED provider. Management plans discussed with the patient, family and they are in  agreement.  CODE STATUS: Full  TOTAL TIME TAKING CARE OF THIS PATIENT: 45 minutes.    Jinny Blossom [place] M.D on 09/01/2019 at 3:13 PM  Between 7am to 6pm - Pager [phone]  After 6pm go to [email or web address] - password [email or web address] [facility]  [employer] [facility]  Office  [phone]  CC: Primary care physician; Lyndon Code, MD   Note: This dictation was prepared with Dragon dictation along with smaller phrase technology. Any transcriptional errors that result from this process are unintentional.

## 2019-09-01 NOTE — ED Provider Notes (Signed)
[facility] Emergency Department Provider Note   ____________________________________________   First MD Initiated Contact with Patient 09/01/19 1117     (approximate)  I have reviewed the triage vital signs and the nursing notes.   HISTORY  Chief Complaint Hematuria    HPI Roger [name] is a 73 y.o. male patient comes in complaining of hematuria.  Blood pressure was low in triage.  Patient reports occasional sweats but attributes that to wearing a heavy coat and longsleeve shirt.  He denies any abdominal pain vomiting shortness of breath unusual coughing or any other symptoms.  He reports he had a Foley catheter placed in [place] several days ago and had some hematuria that is having again now.  He takes Eliquis for his A. fib.         Past Medical History:  Diagnosis Date  . Atrial fibrillation ([place])   . Hypertension     Patient Active Problem List   Diagnosis Date Noted  . Ventricular fibrillation ([place]) 04/19/2019    Past Surgical History:  Procedure Laterality Date  . CORONARY STENT INTERVENTION N/A 04/20/2019   Procedure: CORONARY STENT INTERVENTION;  Surgeon: Yolonda Kida, MD;  Location: [place] CV LAB;  Service: Cardiovascular;  Laterality: N/A;  . LEFT HEART CATH AND CORONARY ANGIOGRAPHY Left 04/20/2019   Procedure: LEFT HEART CATH AND CORONARY ANGIOGRAPHY;  Surgeon: Dionisio David, MD;  Location: [place] CV LAB;  Service: Cardiovascular;  Laterality: Left;    Prior to Admission medications   Medication Sig Start Date End Date Taking? Authorizing Provider  amiodarone (PACERONE) 400 MG tablet Take 1 tablet (400 mg total) by mouth daily. 04/21/19  Yes Mody, Ulice Bold, MD  apixaban (ELIQUIS) 5 MG TABS tablet Take 1 tablet (5 mg total) by mouth 2 (two) times daily. 04/21/19  Yes Mody, Ulice Bold, MD  atorvastatin (LIPITOR) 10 MG tablet Take 10 mg by mouth daily. 05/22/19  Yes [provider]  Cholecalciferol (VITAMIN D3)  1.25 MG (50000 UT) CAPS Take 50,000 Units by mouth once a week. 05/02/19  Yes [provider]  ezetimibe (ZETIA) 10 MG tablet Take 10 mg by mouth daily. 08/13/19  Yes [provider]  furosemide (LASIX) 20 MG tablet Take 20 mg by mouth daily. 08/13/19  Yes [provider]  losartan (COZAAR) 25 MG tablet Take 25 mg by mouth daily. 08/13/19  Yes [provider]  metoprolol tartrate (LOPRESSOR) 25 MG tablet Take 25 mg by mouth 2 (two) times daily.   Yes [provider]  pantoprazole (PROTONIX) 40 MG tablet Take 1 tablet (40 mg total) by mouth daily. 04/21/19  Yes Mody, Ulice Bold, MD  spironolactone (ALDACTONE) 25 MG tablet Take 25 mg by mouth daily. 08/13/19  Yes [provider]  tamsulosin (FLOMAX) 0.4 MG CAPS capsule Take 0.4 mg by mouth daily. 08/29/19  Yes [provider]  clopidogrel (PLAVIX) 75 MG tablet Take 1 tablet (75 mg total) by mouth daily. Patient not taking: Reported on 09/01/2019 04/21/19   Bettey Costa, MD  fluticasone (FLONASE) 50 MCG/ACT nasal spray Place 2 sprays into the nose daily. 04/08/19 04/07/20  [provider]  lisinopril (PRINIVIL,ZESTRIL) 10 MG tablet Take 5 mg by mouth daily.   06/10/19  [provider]    Allergies Amoxicillin, Sulfa antibiotics, Pravastatin, and Rosuvastatin  No family history on file.  Social History Social History   Tobacco Use  . Smoking status: Current Every Day Smoker    Packs/day: 0.50  Types: Cigarettes  . Smokeless tobacco: Never Used  Substance Use Topics  . Alcohol use: Yes    Comment: rare  . Drug use: No    Review of Systems  Constitutional: No fever/chills Eyes: No visual changes. ENT: No sore throat. Cardiovascular: Denies chest pain. Respiratory: Denies shortness of breath. Gastrointestinal: No abdominal pain.  No nausea, no vomiting.  No diarrhea.  No constipation. Genitourinary: Negative for dysuria.  But hematuria Musculoskeletal: Negative for back  pain. Skin: Negative for rash. Neurological: Negative for headaches, focal weakness o  ____________________________________________   PHYSICAL EXAM:  VITAL SIGNS: ED Triage Vitals  Enc Vitals Group     BP 09/01/19 1111 (!) 87/43     Pulse Rate 09/01/19 1110 79     Resp 09/01/19 1110 17     Temp 09/01/19 1110 98 F (36.7 C)     Temp Source 09/01/19 1110 Oral     SpO2 09/01/19 1110 96 %     Weight 09/01/19 1110 206 lb (93.4 kg)     Height 09/01/19 1110  (1.727 m)     Head Circumference --      Peak Flow --      Pain Score --      Pain Loc --      Pain Edu? --      Excl. in GC? --     Constitutional: Alert and oriented. Well appearing and in no acute distress. Eyes: Conjunctivae are normal.  Head: Atraumatic. Nose: No congestion/rhinnorhea. Mouth/Throat: Mucous membranes are moist.  Oropharynx non-erythematous. Neck: No stridor.   Cardiovascular: Normal rate, regular rhythm. Grossly normal heart sounds.  Good peripheral circulation. Respiratory: Normal respiratory effort.  No retractions. Lungs CTAB. Gastrointestinal: Soft and nontender. No distention. No abdominal bruits. No CVA tenderness. Musculoskeletal: No lower extremity tenderness nor edema Neurologic:  Normal speech and language. No gross focal neurologic deficits are appreciated.  Skin:  Skin is warm, dry and intact. No rash noted.   ____________________________________________   LABS (all labs ordered are listed, but only abnormal results are displayed)  Labs Reviewed  COMPREHENSIVE METABOLIC PANEL - Abnormal; Notable for the following components:      Result Value   Glucose, Bld 169 (*)    BUN 24 (*)    Calcium 8.6 (*)    Total Protein 6.4 (*)    Total Bilirubin 1.8 (*)    All other components within normal limits  CBC WITH DIFFERENTIAL/PLATELET - Abnormal; Notable for the following components:   RBC 3.57 (*)    Hemoglobin 10.8 (*)    HCT 33.7 (*)    Neutro Abs 8.7 (*)    All other components  within normal limits  URINALYSIS, COMPLETE (UACMP) WITH MICROSCOPIC - Abnormal; Notable for the following components:   Color, Urine AMBER (*)    APPearance CLOUDY (*)    Hgb urine dipstick LARGE (*)    Protein, ur 100 (*)    Leukocytes,Ua SMALL (*)    RBC / HPF >50 (*)    All other components within normal limits  LACTIC ACID, PLASMA - Abnormal; Notable for the following components:   Lactic Acid, Venous 2.3 (*)    All other components within normal limits  URINE CULTURE  CULTURE, BLOOD (ROUTINE X 2)  CULTURE, BLOOD (ROUTINE X 2)  SARS CORONAVIRUS 2 (TAT 6-24 HRS)  LACTIC ACID, PLASMA  TROPONIN I (HIGH SENSITIVITY)  TROPONIN I (HIGH SENSITIVITY)   ____________________________________________  EKG EKG read interpreted by me shows A.  fib at a rate of 83 normal axis looks very similar to previous EKG from earlier this month.  He does have left bundle.  ____________________________________________  RADIOLOGY  ED MD interpretation: Chest x-ray read by radiology reviewed by me shows no acute disease there is some tracheal narrowing that the radiologist commented on.  CT of the abdomen read by radiology reviewed by me shows 2 bladder stones present.  Patient does not have any hydronephrosis or pain I do not think a prone CT would be worth it at this point.  Official radiology report(s): Ct Abdomen Pelvis W Contrast  Result Date: 09/01/2019 CLINICAL DATA:  Abdominal distension, hematuria EXAM: CT ABDOMEN AND PELVIS WITH CONTRAST TECHNIQUE: Multidetector CT imaging of the abdomen and pelvis was performed using the standard protocol following bolus administration of intravenous contrast. CONTRAST:  100mL OMNIPAQUE IOHEXOL 300 MG/ML  SOLN COMPARISON:  None. FINDINGS: Lower chest: No acute abnormality. Hepatobiliary: No focal liver abnormality is seen. No gallstones, gallbladder wall thickening, or biliary dilatation. Pancreas: Unremarkable. No pancreatic ductal dilatation or surrounding  inflammatory changes. Spleen: Normal in size without focal abnormality. Small splenule at the splenic hilum. Adrenals/Urinary Tract: Unremarkable adrenals. 3 mm nonobstructing calculus within the superior calyx of the left kidney. Kidneys are otherwise unremarkable without hydronephrosis. Ureters are nondilated. Rounded 10 x 9 mm calculus within the urinary bladder in the region of the right ureterovesical junction within additional 3 mm calculus adjacent to it. Urinary bladder is partially decompressed by Foley catheter. There is circumferential bladder wall thickening and mild surrounding fat stranding. Stomach/Bowel: Small hiatal hernia. Stomach and bowel unremarkable. No dilated loops of bowel. No focal bowel wall thickening or inflammatory changes. A normal appendix is located in the right lower quadrant. Vascular/Lymphatic: Aortic atherosclerosis. No enlarged abdominal or pelvic lymph nodes. Reproductive: Prostate is unremarkable. Other: Small fat containing right inguinal hernia and umbilical hernia. No ascites. Musculoskeletal: Prior right total hip arthroplasty, partially visualized. Degenerative changes of the left hip. Dextrocurvature of the lumbar spine with mild right lateral listhesis L4 on L5. No acute osseous findings. IMPRESSION: 1. There is a 10 x 9 mm calculus within the urinary bladder in the region of the right UVJ without resultant ureteral dilation or hydronephrosis. If clinically indicated, a repeat CT of the pelvis in the prone position could be obtained to determine if the calculus is still partially located within the UVJ. 2. Circumferential thickening of the urinary bladder wall with mild associated fat stranding. Correlate with urinalysis to assess for cystitis. 3. Nonobstructing 3 mm left renal calculus. 4. Small hiatal hernia. Electronically Signed   By: Duanne GuessNicholas  Plundo M.D.   On: 09/01/2019 13:40   Dg Chest Portable 1 View  Result Date: 09/01/2019 CLINICAL DATA:  Pt states  noticing blood in his urinary catheter yesterday. Hx of hypertension, atrial fibrillation. Current smoker. EXAM: PORTABLE CHEST 1 VIEW COMPARISON:  Chest radiograph 04/19/2019, 11/25/2016 FINDINGS: Stable cardiomediastinal contours with enlarged heart size. Redemonstrated narrowing of the upper trachea. The lungs are clear. No pneumothorax or large pleural effusion. No acute finding in the visualized skeleton. IMPRESSION: 1.  No acute cardiopulmonary process. 2. Redemonstrated narrowing of the upper trachea as seen on prior studies. Electronically Signed   By: Emmaline KluverNancy  Ballantyne M.D.   On: 09/01/2019 12:16    ____________________________________________   PROCEDURES  Procedure(s) performed (including Critical Care):  Procedures   ____________________________________________   INITIAL IMPRESSION / ASSESSMENT AND PLAN / ED COURSE Roger [name] was evaluated in Emergency Department on 09/01/2019  for the symptoms described in the history of present illness. He was evaluated in the context of the global COVID-19 pandemic, which necessitated consideration that the patient might be at risk for infection with the SARS-CoV-2 virus that causes COVID-19. Institutional protocols and algorithms that pertain to the evaluation of patients at risk for COVID-19 are in a state of rapid change based on information released by regulatory bodies including the CDC and federal and state organizations. These policies and algorithms were followed during the patient's care in the ED.  Patient is an older gentleman with hematuria and UTI who had a an initial blood pressure in the 80s it came up after 2 L of fluid.  He also had a lactic acid that was elevated.  I do not believe it would be prudent to send this gentleman home immediately.  We will give him antibiotics and watch him overnight.             ____________________________________________   FINAL CLINICAL IMPRESSION(S) / ED DIAGNOSES  Final diagnoses:   Hematuria, unspecified type  Urinary tract infection with hematuria, site unspecified  Hypotension, unspecified hypotension type     ED Discharge Orders    None       Note:  This document was prepared using Dragon voice recognition software and may include unintentional dictation errors.    Arnaldo Natal, MD 09/01/19 [phone]

## 2019-09-01 NOTE — Progress Notes (Signed)
Family Meeting Note  Advance Directive:no  Today a meeting took place with the Patient.  Patient is able to participate.  The following clinical team members were present during this meeting:MD  The following were discussed:Patient's diagnosis: hypotension, hematuria, Patient's progosis: Unable to determine and Goals for treatment: Full Code  Additional follow-up to be provided: prn  Time spent during discussion:20 minutes  Evette Doffing, MD

## 2019-09-01 NOTE — ED Notes (Addendum)
Second troponin and lactic drawn and sent to lab.

## 2019-09-01 NOTE — ED Notes (Signed)
This RN and Annie Main, RN introduced self to pt. Pt states he came in due to blood in his catheter. Pt states catheter was placed on Monday. Pt denies any pain. Labs drawn and sent. Pt placed on monitor. Will continue to monitor.

## 2019-09-01 NOTE — ED Notes (Signed)
One blood culture drawn and sent at this time per doctor [facility]

## 2019-09-02 DIAGNOSIS — R338 Other retention of urine: Secondary | ICD-10-CM | POA: Diagnosis not present

## 2019-09-02 DIAGNOSIS — Z7901 Long term (current) use of anticoagulants: Secondary | ICD-10-CM | POA: Diagnosis not present

## 2019-09-02 DIAGNOSIS — N401 Enlarged prostate with lower urinary tract symptoms: Secondary | ICD-10-CM | POA: Diagnosis not present

## 2019-09-02 DIAGNOSIS — N210 Calculus in bladder: Secondary | ICD-10-CM

## 2019-09-02 DIAGNOSIS — R310 Gross hematuria: Secondary | ICD-10-CM | POA: Diagnosis not present

## 2019-09-02 LAB — BASIC METABOLIC PANEL
Anion gap: 6 (ref 5–15)
BUN: 17 mg/dL (ref 8–23)
CO2: 27 mmol/L (ref 22–32)
Calcium: 8.2 mg/dL — ABNORMAL LOW (ref 8.9–10.3)
Chloride: 109 mmol/L (ref 98–111)
Creatinine, Ser: 1.03 mg/dL (ref 0.61–1.24)
GFR calc Af Amer: >60 mL/min (ref >60)
GFR calc non Af Amer: >60 mL/min (ref >60)
Glucose, Bld: 84 mg/dL (ref 70–99)
Potassium: 4.1 mmol/L (ref 3.5–5.1)
Sodium: 142 mmol/L (ref 135–145)

## 2019-09-02 LAB — URINE CULTURE: Culture: NO GROWTH

## 2019-09-02 LAB — CBC
HCT: 31.8 % — ABNORMAL LOW (ref 39.0–52.0)
Hemoglobin: 10.1 g/dL — ABNORMAL LOW (ref 13.0–17.0)
MCH: 30.4 pg (ref 26.0–34.0)
MCHC: 31.8 g/dL (ref 30.0–36.0)
MCV: 95.8 fL (ref 80.0–100.0)
Platelets: 222 10*3/uL (ref 150–400)
RBC: 3.32 MIL/uL — ABNORMAL LOW (ref 4.22–5.81)
RDW: 14.6 % (ref 11.5–15.5)
WBC: 8.3 10*3/uL (ref 4.0–10.5)
nRBC: 0 % (ref 0.0–0.2)

## 2019-09-02 LAB — SARS CORONAVIRUS 2 (TAT 6-24 HRS): SARS Coronavirus 2: NEGATIVE

## 2019-09-02 MED ORDER — CHLORHEXIDINE GLUCONATE CLOTH 2 % EX PADS
6.0000 | MEDICATED_PAD | Freq: Every day | CUTANEOUS | Status: DC
  Administered 2019-09-02 – 2019-09-03 (×2): 6 via TOPICAL

## 2019-09-02 NOTE — Progress Notes (Signed)
[place] [facility] [place] NAME: Roger [name]    MR#:  [id number]  DATE OF BIRTH:  [birth year]  SUBJECTIVE:    REVIEW OF SYSTEMS:   ROS Tolerating Diet: Tolerating PT:   DRUG ALLERGIES:   Allergies  Allergen Reactions  . Amoxicillin Anaphylaxis  . Sulfa Antibiotics Anaphylaxis  . Pravastatin Other (See Comments)  . Rosuvastatin Other (See Comments)    VITALS:  Blood pressure 110/73, pulse 77, temperature 98.6 F (37 C), temperature source Oral, resp. rate 20, height 5\' 8"  (1.727 m), weight 93.4 kg, SpO2 97 %.  PHYSICAL EXAMINATION:   Physical Exam  GENERAL:  73 y.o.-year-old patient lying in the bed with no acute distress.  EYES: Pupils equal, round, reactive to light and accommodation. No scleral icterus. Extraocular muscles intact.  HEENT: Head atraumatic, normocephalic. Oropharynx and nasopharynx clear.  NECK:  Supple, no jugular venous distention. No thyroid enlargement, no tenderness.  LUNGS: Normal breath sounds bilaterally, no wheezing, rales, rhonchi. No use of accessory muscles of respiration.  CARDIOVASCULAR: S1, S2 normal. No murmurs, rubs, or gallops.  ABDOMEN: Soft, nontender, nondistended. Bowel sounds present. No organomegaly or mass.  EXTREMITIES: No cyanosis, clubbing or edema b/l.    NEUROLOGIC: Cranial nerves II through XII are intact. No focal Motor or sensory deficits b/l.   PSYCHIATRIC:  patient is alert and oriented x 3.  SKIN: No obvious rash, lesion, or ulcer.   LABORATORY PANEL:  CBC Recent Labs  Lab 09/02/19 0449  WBC 8.3  HGB 10.1*  HCT 31.8*  PLT 222    Chemistries  Recent Labs  Lab 09/01/19 1126 09/02/19 0449  NA 140 142  K 3.9 4.1  CL 103 109  CO2 24 27  GLUCOSE 169* 84  BUN 24* 17  CREATININE 1.18 1.03  CALCIUM 8.6* 8.2*  AST 17  --   ALT 8  --   ALKPHOS 62  --   BILITOT 1.8*  --    Cardiac Enzymes No results for input(s): TROPONINI in the last 168 hours. RADIOLOGY:   Ct Abdomen Pelvis W Contrast  Result Date: 09/01/2019 CLINICAL DATA:  Abdominal distension, hematuria EXAM: CT ABDOMEN AND PELVIS WITH CONTRAST TECHNIQUE: Multidetector CT imaging of the abdomen and pelvis was performed using the standard protocol following bolus administration of intravenous contrast. CONTRAST:  141mL OMNIPAQUE IOHEXOL 300 MG/ML  SOLN COMPARISON:  None. FINDINGS: Lower chest: No acute abnormality. Hepatobiliary: No focal liver abnormality is seen. No gallstones, gallbladder wall thickening, or biliary dilatation. Pancreas: Unremarkable. No pancreatic ductal dilatation or surrounding inflammatory changes. Spleen: Normal in size without focal abnormality. Small splenule at the splenic hilum. Adrenals/Urinary Tract: Unremarkable adrenals. 3 mm nonobstructing calculus within the superior calyx of the left kidney. Kidneys are otherwise unremarkable without hydronephrosis. Ureters are nondilated. Rounded 10 x 9 mm calculus within the urinary bladder in the region of the right ureterovesical junction within additional 3 mm calculus adjacent to it. Urinary bladder is partially decompressed by Foley catheter. There is circumferential bladder wall thickening and mild surrounding fat stranding. Stomach/Bowel: Small hiatal hernia. Stomach and bowel unremarkable. No dilated loops of bowel. No focal bowel wall thickening or inflammatory changes. A normal appendix is located in the right lower quadrant. Vascular/Lymphatic: Aortic atherosclerosis. No enlarged abdominal or pelvic lymph nodes. Reproductive: Prostate is unremarkable. Other: Small fat containing right inguinal hernia and umbilical hernia. No ascites. Musculoskeletal: Prior right total hip arthroplasty, partially visualized. Degenerative changes of the left hip. Dextrocurvature of  the lumbar spine with mild right lateral listhesis L4 on L5. No acute osseous findings. IMPRESSION: 1. There is a 10 x 9 mm calculus within the urinary bladder in the  region of the right UVJ without resultant ureteral dilation or hydronephrosis. If clinically indicated, a repeat CT of the pelvis in the prone position could be obtained to determine if the calculus is still partially located within the UVJ. 2. Circumferential thickening of the urinary bladder wall with mild associated fat stranding. Correlate with urinalysis to assess for cystitis. 3. Nonobstructing 3 mm left renal calculus. 4. Small hiatal hernia. Electronically Signed   By: Duanne Guess M.D.   On: 09/01/2019 13:40   Dg Chest Portable 1 View  Result Date: 09/01/2019 CLINICAL DATA:  Pt states noticing blood in his urinary catheter yesterday. Hx of hypertension, atrial fibrillation. Current smoker. EXAM: PORTABLE CHEST 1 VIEW COMPARISON:  Chest radiograph 04/19/2019, 11/25/2016 FINDINGS: Stable cardiomediastinal contours with enlarged heart size. Redemonstrated narrowing of the upper trachea. The lungs are clear. No pneumothorax or large pleural effusion. No acute finding in the visualized skeleton. IMPRESSION: 1.  No acute cardiopulmonary process. 2. Redemonstrated narrowing of the upper trachea as seen on prior studies. Electronically Signed   By: Emmaline Kluver M.D.   On: 09/01/2019 12:16   ASSESSMENT AND PLAN:   Roger [name]  is a 73 y.o. male with a known history of hypertension and atrial fibrillation on Eliquis who presented to the ED with hematuria that started this morning.   *Hypotension- may be due to mild hydration vs BP meds, lasix, or flomax. No signs of infection. BP has returned to normal with IVFs. -Will hold home lasix, losartan, and spironolactone and see what his BPs do -Continue home amiodarone  -holding metoprolol today --bp soft -Continue gentle IVFs  *Hematuria- patient had acute urinary retention 5 days ago and had a foley catheter placed. -CT abdomen/pelvis with 10 x 9 mm calculus in the urinary bladder in the region of the UVJ without ureteral dilation or  hydronephrosis -Urology consult--with Dr Ronne Binning. Already has appointment with [facility] for Monday -Continue flomax -Will not give any more antibiotics for possible UTI - patient is asymptomatic and UA is not consistent with UTI -Follow-up urine culture  *Acute blood loss anemia - likely due to hematuria. Hemoglobin has dropped a little bit from baseline. -Continue eliquis for now  *Lactic acidosis- resolved with IV fluids. -Monitor  *Persistent atrial fibrillation- rate-controlled here -Continue home amiodarone  * Hyperlipidemia- stable -Continue lipitor and zetia  Anticipated discharge tomorrow patient continues to show improvement with outpatient follow-up [facility] on Monday  CODE STATUS: full  DVT Prophylaxis: eliquis  TOTAL TIME TAKING CARE OF THIS PATIENT: *30* minutes.  >50% time spent on counselling and coordination of care  POSSIBLE D/C IN *1-2* DAYS, DEPENDING ON CLINICAL CONDITION.  Note: This dictation was prepared with Dragon dictation along with smaller phrase technology. Any transcriptional errors that result from this process are unintentional.  Enedina Finner M.D on 09/02/2019 at 12:37 PM  Between 7am to 6pm - Pager - [phone]  After 6pm go to [email or web address] - password [email or web address]  [facility]  Office  [phone]  CC: Primary care physician; Lyndon Code, MDPatient ID: Roger [name], male   DOB: December 22, 1945, 73 y.o.   MRN: [id number]

## 2019-09-02 NOTE — Care Management Obs Status (Signed)
[facility] NOTIFICATION   Patient Details  Name: Roger [name] MRN: [id number] Date of Birth: 1946/06/24   Medicare Observation Status Notification Given:  Yes    Adanely Reynoso A Rubi Tooley, RN 09/02/2019, 1:24 PM

## 2019-09-02 NOTE — Consult Note (Signed)
Urology Consult  Referring physician: Dr. Nancy Marusmayo Reason for referral: Gross hematuria  Chief Complaint: Gross hematuria  History of Present Illness: [name] is a 73yo with a history of Afib on eliquis, HTN, and BPH who presented to [facility] with new gross hematuria for the past 2 days. He was previously seen at [facility] in [place] 4 times in the past week for urinary retention and gross hematuria. He had a foley placed during his first ER presentation at [facility].. He has not previously been on BPH therapy.  He has severe LUTS at baseline. No previous hx of gross hematuria. No previous UTIs. He [facility] CT at [facility] which shows a 60g prostate with a 1.0cm bladder calculus. No flank pain. No suprapubic pain. No other associated symptoms. No exacerbating/alleviaitng events. Foley catheter is currently draining light brown urine. WBC count and Creatinine normal. Urine culture shows no growth  Past Medical History:  Diagnosis Date  . Atrial fibrillation (HCC)   . Hypertension    Past Surgical History:  Procedure Laterality Date  . CORONARY STENT INTERVENTION N/A 04/20/2019   Procedure: CORONARY STENT INTERVENTION;  Surgeon: Alwyn Peaallwood, Dwayne D, MD;  Location: [facility] INVASIVE CV LAB;  Service: Cardiovascular;  Laterality: N/A;  . LEFT HEART CATH AND CORONARY ANGIOGRAPHY Left 04/20/2019   Procedure: LEFT HEART CATH AND CORONARY ANGIOGRAPHY;  Surgeon: Laurier NancyKhan, Shaukat A, MD;  Location: [facility] INVASIVE CV LAB;  Service: Cardiovascular;  Laterality: Left;    Medications: I have reviewed the patient's current medications. Allergies:  Allergies  Allergen Reactions  . Amoxicillin Anaphylaxis  . Sulfa Antibiotics Anaphylaxis  . Pravastatin Other (See Comments)  . Rosuvastatin Other (See Comments)    No family history on file. Social History:  reports that he has been smoking cigarettes. He has been smoking about 0.50 packs per day. He has never used smokeless tobacco. He reports current alcohol  use. He reports that he does not use drugs.  Review of Systems  Genitourinary: Positive for frequency, hematuria and urgency.  All other systems reviewed and are negative.   Physical Exam:  Vital signs in last 24 hours: Temp:  [97.8 F (36.6 C)-98.7 F (37.1 C)] 97.8 F (36.6 C) (09/26 1341) Pulse Rate:  [59-81] 59 (09/26 1341) Resp:  [16-20] 18 (09/26 1341) BP: (91-136)/(53-74) 91/65 (09/26 1341) SpO2:  [97 %-100 %] 97 % (09/26 1341) Physical Exam  Constitutional: He is oriented to person, place, and time. He appears well-developed and well-nourished.  HENT:  Head: Normocephalic and atraumatic.  Eyes: Pupils are equal, round, and reactive to light. EOM are normal.  Neck: Normal range of motion. No thyromegaly present.  Cardiovascular: Normal rate and regular rhythm.  Respiratory: No respiratory distress.  GI: Soft. He exhibits no distension.  Musculoskeletal: Normal range of motion.        General: No edema.  Neurological: He is alert and oriented to person, place, and time.  Skin: Skin is warm and dry.  Psychiatric: He has a normal mood and affect. His behavior is normal. Judgment and thought content normal.    Laboratory Data:  Results for orders placed or performed during the hospital encounter of 09/01/19 (from the past 72 hour(s))  Comprehensive metabolic panel     Status: Abnormal   Collection Time: 09/01/19 11:26 AM  Result Value Ref Range   Sodium 140 135 - 145 mmol/L   Potassium 3.9 3.5 - 5.1 mmol/L   Chloride 103 98 - 111 mmol/L   CO2 24 22 - 32  mmol/L   Glucose, Bld 169 (H) 70 - 99 mg/dL   BUN 24 (H) 8 - 23 mg/dL   Creatinine, Ser 2.72 0.61 - 1.24 mg/dL   Calcium 8.6 (L) 8.9 - 10.3 mg/dL   Total Protein 6.4 (L) 6.5 - 8.1 g/dL   Albumin 3.5 3.5 - 5.0 g/dL   AST 17 15 - 41 U/L   ALT 8 0 - 44 U/L   Alkaline Phosphatase 62 38 - 126 U/L   Total Bilirubin 1.8 (H) 0.3 - 1.2 mg/dL   GFR calc non Af Amer >60 >60 mL/min   GFR calc Af Amer >60 >60 mL/min   Anion  gap 13 5 - 15    Comment: Performed at [facility], [address] [place] , [place] [address]  CBC with Differential     Status: Abnormal   Collection Time: 09/01/19 11:26 AM  Result Value Ref Range   WBC 9.9 4.0 - 10.5 K/uL   RBC 3.57 (L) 4.22 - 5.81 MIL/uL   Hemoglobin 10.8 (L) 13.0 - 17.0 g/dL   HCT 40.3 (L) 47.4 - 25.9 %   MCV 94.4 80.0 - 100.0 fL   MCH 30.3 26.0 - 34.0 pg   MCHC 32.0 30.0 - 36.0 g/dL   RDW 56.3 87.5 - 64.3 %   Platelets 252 150 - 400 K/uL   nRBC 0.0 0.0 - 0.2 %   Neutrophils Relative % 86 %   Neutro Abs 8.7 (H) 1.7 - 7.7 K/uL   Lymphocytes Relative 8 %   Lymphs Abs 0.8 0.7 - 4.0 K/uL   Monocytes Relative 4 %   Monocytes Absolute 0.4 0.1 - 1.0 K/uL   Eosinophils Relative 0 %   Eosinophils Absolute 0.0 0.0 - 0.5 K/uL   Basophils Relative 1 %   Basophils Absolute 0.1 0.0 - 0.1 K/uL   Immature Granulocytes 1 %   Abs Immature Granulocytes 0.05 0.00 - 0.07 K/uL    Comment: Performed at [facility], [address] [place], [place] [address]  Urinalysis, Complete w Microscopic     Status: Abnormal   Collection Time: 09/01/19 11:26 AM  Result Value Ref Range   Color, Urine AMBER (A) YELLOW   APPearance CLOUDY (A) CLEAR   Specific Gravity, Urine 1.014 1.005 - 1.030   pH 6.0 5.0 - 8.0   Glucose, UA NEGATIVE NEGATIVE mg/dL   Hgb urine dipstick LARGE (A) NEGATIVE   Bilirubin Urine NEGATIVE NEGATIVE   Ketones, ur NEGATIVE NEGATIVE mg/dL   Protein, ur 884 (A) NEGATIVE mg/dL   Nitrite NEGATIVE NEGATIVE   Leukocytes,Ua SMALL (A) NEGATIVE   RBC / HPF >50 (H) 0 - 5 RBC/hpf   WBC, UA 11-20 0 - 5 WBC/hpf   Bacteria, UA NONE SEEN NONE SEEN   Squamous Epithelial / LPF NONE SEEN 0 - 5   Mucus PRESENT    Amorphous Crystal PRESENT     Comment: Performed at [facility], [address] [place], [place] [address]  Culture, blood (routine x 2)     Status: None (Preliminary result)   Collection Time: 09/01/19 11:33 AM   Specimen: BLOOD   Result Value Ref Range   Specimen Description BLOOD BLOOD RIGHT FOREARM    Special Requests Blood Culture adequate volume    Culture      NO GROWTH < 24 HOURS Performed at [facility], [address] [place], [place] [address]    Report Status PENDING   Troponin I (High Sensitivity)  Status: None   Collection Time: 09/01/19 11:34 AM  Result Value Ref Range   Troponin I (High Sensitivity) 6 <18 ng/L    Comment: (NOTE) Elevated high sensitivity troponin I (hsTnI) values and significant  changes across serial measurements may suggest ACS but many other  chronic and acute conditions are known to elevate hsTnI results.  Refer to the "Links" section for chest pain algorithms and additional  guidance. Performed at [facility], [address] [place], [place] [address]   Lactic acid, plasma     Status: Abnormal   Collection Time: 09/01/19 11:34 AM  Result Value Ref Range   Lactic Acid, Venous 2.3 (HH) 0.5 - 1.9 mmol/L    Comment: CRITICAL RESULT CALLED TO, READ BACK BY AND VERIFIED WITH STEPHEN JONES AT 1216 09/01/2019.PMF Performed at [facility], [address] [place], [place] [address]   Urine culture     Status: None   Collection Time: 09/01/19 11:34 AM   Specimen: Urine, Random  Result Value Ref Range   Specimen Description      URINE, RANDOM Performed at [facility], [address], [place], [place] [address]    Special Requests      NONE Performed at [facility], [address] [place], [place] [address]    Culture      NO GROWTH Performed at [facility], [address] [place], [place] [address]    Report Status 09/02/2019 FINAL   Lactic acid, plasma     Status: None   Collection Time: 09/01/19  1:27 PM  Result Value Ref Range   Lactic Acid, Venous 1.7 0.5 - 1.9 mmol/L    Comment: Performed at [facility], [address] [place], [place] [address]  Troponin I (High Sensitivity)      Status: None   Collection Time: 09/01/19  1:27 PM  Result Value Ref Range   Troponin I (High Sensitivity) 6 <18 ng/L    Comment: (NOTE) Elevated high sensitivity troponin I (hsTnI) values and significant  changes across serial measurements may suggest ACS but many other  chronic and acute conditions are known to elevate hsTnI results.  Refer to the "Links" section for chest pain algorithms and additional  guidance. Performed at [facility], [address] [place], [place] [address]   SARS CORONAVIRUS 2 (TAT 6-24 HRS) Nasopharyngeal Nasopharyngeal Swab     Status: None   Collection Time: 09/01/19  2:45 PM   Specimen: Nasopharyngeal Swab  Result Value Ref Range   SARS Coronavirus 2 NEGATIVE NEGATIVE    Comment: (NOTE) SARS-CoV-2 target nucleic acids are NOT DETECTED. The SARS-CoV-2 RNA is generally detectable in upper and lower respiratory specimens during the acute phase of infection. Negative results do not preclude SARS-CoV-2 infection, do not rule out co-infections with other pathogens, and should not be used as the sole basis for treatment or other patient management decisions. Negative results must be combined with clinical observations, patient history, and epidemiological information. The expected result is Negative. Fact Sheet for Patients: HairSlick.no Fact Sheet for Healthcare Providers: quierodirigir.com This test is not yet approved or cleared by the [place] FDA and  has been authorized for detection and/or diagnosis of SARS-CoV-2 by FDA under an Emergency Use Authorization (EUA). This EUA will remain  in effect (meaning this test can be used) for the duration of the COVID-19 declaration under Section 56 4(b)(1) of the Act, 21 U.S.C. section 360bbb-3(b)(1), unless the authorization is terminated  or revoked sooner. Performed at [facility], [address], [place], [place] [address]    Basic metabolic panel     Status: Abnormal   Collection Time: 09/02/19  4:49 AM  Result Value Ref Range   Sodium 142 135 - 145 mmol/L   Potassium 4.1 3.5 - 5.1 mmol/L   Chloride 109 98 - 111 mmol/L   CO2 27 22 - 32 mmol/L   Glucose, Bld 84 70 - 99 mg/dL   BUN 17 8 - 23 mg/dL   Creatinine, Ser 6.94 0.61 - 1.24 mg/dL   Calcium 8.2 (L) 8.9 - 10.3 mg/dL   GFR calc non Af Amer >60 >60 mL/min   GFR calc Af Amer >60 >60 mL/min   Anion gap 6 5 - 15    Comment: Performed at [facility], [address] [place], [place] [address]  CBC     Status: Abnormal   Collection Time: 09/02/19  4:49 AM  Result Value Ref Range   WBC 8.3 4.0 - 10.5 K/uL   RBC 3.32 (L) 4.22 - 5.81 MIL/uL   Hemoglobin 10.1 (L) 13.0 - 17.0 g/dL   HCT 82.8 (L) 00.3 - 49.1 %   MCV 95.8 80.0 - 100.0 fL   MCH 30.4 26.0 - 34.0 pg   MCHC 31.8 30.0 - 36.0 g/dL   RDW 79.1 50.5 - 69.7 %   Platelets 222 150 - 400 K/uL   nRBC 0.0 0.0 - 0.2 %    Comment: Performed at [facility], [address] [place], [place] [address]   Recent Results (from the past 240 hour(s))  Culture, blood (routine x 2)     Status: None (Preliminary result)   Collection Time: 09/01/19 11:33 AM   Specimen: BLOOD  Result Value Ref Range Status   Specimen Description BLOOD BLOOD RIGHT FOREARM  Final   Special Requests Blood Culture adequate volume  Final   Culture   Final    NO GROWTH < 24 HOURS Performed at [facility], [address], [place], [place] [address]    Report Status PENDING  Incomplete  Urine culture     Status: None   Collection Time: 09/01/19 11:34 AM   Specimen: Urine, Random  Result Value Ref Range Status   Specimen Description   Final    URINE, RANDOM Performed at [facility], [address], [place], [place] [address]    Special Requests   Final    NONE Performed at [facility], [address], [place], [place] [address]    Culture   Final    NO GROWTH Performed at  [facility], [address], [place], [place] [address]    Report Status 09/02/2019 FINAL  Final  SARS CORONAVIRUS 2 (TAT 6-24 HRS) Nasopharyngeal Nasopharyngeal Swab     Status: None   Collection Time: 09/01/19  2:45 PM   Specimen: Nasopharyngeal Swab  Result Value Ref Range Status   SARS Coronavirus 2 NEGATIVE NEGATIVE Final    Comment: (NOTE) SARS-CoV-2 target nucleic acids are NOT DETECTED. The SARS-CoV-2 RNA is generally detectable in upper and lower respiratory specimens during the acute phase of infection. Negative results do not preclude SARS-CoV-2 infection, do not rule out co-infections with other pathogens, and should not be used as the sole basis for treatment or other patient management decisions. Negative results must be combined with clinical observations, patient history, and epidemiological information. The expected result is Negative. Fact Sheet for  Patients: SugarRoll.be Fact Sheet for Healthcare Providers: https://www.woods-mathews.com/ This test is not yet approved or cleared by the [place] FDA and  has been authorized for detection and/or diagnosis of SARS-CoV-2 by FDA under an Emergency Use Authorization (EUA). This EUA will remain  in effect (meaning this test can be used) for the duration of the COVID-19 declaration under Section 56 4(b)(1) of the Act, 21 U.S.C. section 360bbb-3(b)(1), unless the authorization is terminated or revoked sooner. Performed at [facility], [place] [address], [place], [place] [address]    Creatinine: Recent Labs    08/26/19 1520 09/01/19 1126 09/02/19 0449  CREATININE 1.11 1.18 1.03   Baseline Creatinine: 1  Impression/Assessment:  73yo with BPH, gross hematuria, bladder calculus  Plan:  1. Gross hematuria: The hematuria is likely a combination of prostatic bleeding, foley irritation, and his bladder calculus. Urine is currently clear and the foley should  remain in place.   2. BPH with Urinary retention/bladder calculus: I discussed the management with the patient including continuing the indwelling foley. He will likely need a TURP at the time of his cystolithalopaxy. He is scheduled with [facility]rology on 9/ 28.   Nicolette Bang 09/02/2019, 2:48 PM

## 2019-09-03 ENCOUNTER — Other Ambulatory Visit: Payer: Self-pay

## 2019-09-03 ENCOUNTER — Emergency Department
Admission: EM | Admit: 2019-09-03 | Discharge: 2019-09-03 | Disposition: A | Discharge status: Home / Self Care | Payer: Medicare Other | Source: Home / Self Care | Attending: Emergency Medicine | Admitting: Emergency Medicine

## 2019-09-03 DIAGNOSIS — Y738 Miscellaneous gastroenterology and urology devices associated with adverse incidents, not elsewhere classified: Secondary | ICD-10-CM | POA: Insufficient documentation

## 2019-09-03 DIAGNOSIS — Z79899 Other long term (current) drug therapy: Secondary | ICD-10-CM | POA: Insufficient documentation

## 2019-09-03 DIAGNOSIS — T839XXA Unspecified complication of genitourinary prosthetic device, implant and graft, initial encounter: Secondary | ICD-10-CM | POA: Insufficient documentation

## 2019-09-03 DIAGNOSIS — I10 Essential (primary) hypertension: Secondary | ICD-10-CM | POA: Insufficient documentation

## 2019-09-03 DIAGNOSIS — I2510 Atherosclerotic heart disease of native coronary artery without angina pectoris: Secondary | ICD-10-CM | POA: Insufficient documentation

## 2019-09-03 DIAGNOSIS — F17210 Nicotine dependence, cigarettes, uncomplicated: Secondary | ICD-10-CM | POA: Insufficient documentation

## 2019-09-03 DIAGNOSIS — Z7901 Long term (current) use of anticoagulants: Secondary | ICD-10-CM | POA: Insufficient documentation

## 2019-09-03 MED ORDER — LOSARTAN POTASSIUM 25 MG PO TABS
25.0000 mg | ORAL_TABLET | Freq: Every day | ORAL | Status: DC
  Filled 2019-09-03: qty 1

## 2019-09-03 MED ORDER — METOPROLOL TARTRATE 25 MG PO TABS
25.0000 mg | ORAL_TABLET | Freq: Two times a day (BID) | ORAL | Status: DC
  Administered 2019-09-03: 10:00:00 25 mg via ORAL
  Filled 2019-09-03: qty 1

## 2019-09-03 MED ORDER — SPIRONOLACTONE 25 MG PO TABS
25.0000 mg | ORAL_TABLET | Freq: Every day | ORAL | Status: DC
  Administered 2019-09-03: 10:00:00 25 mg via ORAL
  Filled 2019-09-03: qty 1

## 2019-09-03 NOTE — ED Notes (Signed)
Pt states the leg clamp that holds the foley bag broke. They replaced it in triage. That also happened on Monday as well. Pt is not using a leg bag, he is using the drainage bag and carrying it around. Pt is on coumadin and states after the clamp broke, it pulled and caused a small amount of bleeding.

## 2019-09-03 NOTE — Discharge Instructions (Signed)
We have secured your catheter with a leg strap. Continue to use the catheter as directed. Follow-up with your provider or return as needed.

## 2019-09-03 NOTE — ED Triage Notes (Signed)
Pt presents via POV c/o foley issue. Reports tape securement device moved. Report still draining.

## 2019-09-03 NOTE — ED Provider Notes (Signed)
[facility] Emergency Department Provider Note ____________________________________________  Time seen: 1717  I have reviewed the triage vital signs and the nursing notes.  HISTORY  Chief Complaint  Foley issue  HPI Roger [name] is a 73 y.o. male presents himself to the ED for management of his Foley gravity bag and strap. He reports that the leg clip has failed. He is without complaints of retention, leakage, or dislodgement. He is denying the need for a leg bag. He is scheduled to see his urologist tomorrow.   Past Medical History:  Diagnosis Date  . Atrial fibrillation ([place])   . Hypertension     Patient Active Problem List   Diagnosis Date Noted  . Hypotension 09/01/2019  . Ventricular fibrillation ([place]) 04/19/2019    Past Surgical History:  Procedure Laterality Date  . CORONARY STENT INTERVENTION N/A 04/20/2019   Procedure: CORONARY STENT INTERVENTION;  Surgeon: Yolonda Kida, MD;  Location: [facility];  Service: Cardiovascular;  Laterality: N/A;  . LEFT HEART CATH AND CORONARY ANGIOGRAPHY Left 04/20/2019   Procedure: LEFT HEART CATH AND CORONARY ANGIOGRAPHY;  Surgeon: Dionisio David, MD;  Location: [facility];  Service: Cardiovascular;  Laterality: Left;    Prior to Admission medications   Medication Sig Start Date End Date Taking? Authorizing Provider  amiodarone (PACERONE) 400 MG tablet Take 1 tablet (400 mg total) by mouth daily. 04/21/19   Bettey Costa, MD  apixaban (ELIQUIS) 5 MG TABS tablet Take 1 tablet (5 mg total) by mouth 2 (two) times daily. 04/21/19   Bettey Costa, MD  atorvastatin (LIPITOR) 10 MG tablet Take 10 mg by mouth daily. 05/22/19   [provider]  Cholecalciferol (VITAMIN D3) 1.25 MG (50000 UT) CAPS Take 50,000 Units by mouth once a week. 05/02/19   [provider]  ezetimibe (ZETIA) 10 MG tablet Take 10 mg by mouth daily. 08/13/19   [provider]  fluticasone (FLONASE) 50  MCG/ACT nasal spray Place 2 sprays into the nose daily. 04/08/19 04/07/20  [provider]  metoprolol tartrate (LOPRESSOR) 25 MG tablet Take 25 mg by mouth 2 (two) times daily.    [provider]  pantoprazole (PROTONIX) 40 MG tablet Take 1 tablet (40 mg total) by mouth daily. 04/21/19   Bettey Costa, MD  spironolactone (ALDACTONE) 25 MG tablet Take 25 mg by mouth daily. 08/13/19   [provider]  tamsulosin (FLOMAX) 0.4 MG CAPS capsule Take 0.4 mg by mouth daily. 08/29/19   [provider]  lisinopril (PRINIVIL,ZESTRIL) 10 MG tablet Take 5 mg by mouth daily.   06/10/19  [provider]    Allergies Amoxicillin, Sulfa antibiotics, Pravastatin, and Rosuvastatin  History reviewed. No pertinent family history.  Social History Social History   Tobacco Use  . Smoking status: Current Every Day Smoker    Packs/day: 0.50    Types: Cigarettes  . Smokeless tobacco: Never Used  Substance Use Topics  . Alcohol use: Yes    Comment: rare  . Drug use: No    Review of Systems  Constitutional: Negative for fever. Eyes: Negative for visual changes. ENT: Negative for sore throat. Cardiovascular: Negative for chest pain. Respiratory: Negative for shortness of breath. Gastrointestinal: Negative for abdominal pain, vomiting and diarrhea. Genitourinary: Negative for dysuria. Musculoskeletal: Negative for back pain. Skin: Negative for rash. Neurological: Negative for headaches, focal weakness or numbness. ____________________________________________  PHYSICAL EXAM:  VITAL SIGNS: ED Triage Vitals  Enc Vitals Group  BP 09/03/19 1626 (!) 127/96     Pulse Rate 09/03/19 1626 79     Resp 09/03/19 1626 16     Temp 09/03/19 1626 98.6 F (37 C)     Temp Source 09/03/19 1626 Oral     SpO2 09/03/19 1626 95 %     Weight --      Height --      Head Circumference --      Peak Flow --      Pain Score 09/03/19 1649 0     Pain Loc --      Pain Edu? --       Excl. in GC? --     Constitutional: Alert and oriented. Well appearing and in no distress. Head: Normocephalic and atraumatic. Eyes: Conjunctivae are normal. Normal extraocular movements Cardiovascular: Normal rate, regular rhythm. Normal distal pulses. Respiratory: Normal respiratory effort.  GU: foley in place.  Musculoskeletal: Nontender with normal range of motion in all extremities.  Neurologic:  Normal gait without ataxia. Normal speech and language. No gross focal neurologic deficits are appreciated. Skin:  Skin is warm, dry and intact. No rash noted. ____________________________________________  PROCEDURES  Leg strap applied. Procedures ____________________________________________  INITIAL IMPRESSION / ASSESSMENT AND PLAN / ED COURSE  Patient with ED evaluation management of his Foley catheter and gravity bag.  Patient was offered a leg bag but declined at this time.  He will continue with the gravity bag that he is carrying in a canvas shopping bag side of his body.  The elastic strap was applied to the patient's thigh to further secure the Foley drainage tube.  Patient is discharged at this time to call with his urologist as scheduled.  Roger [name] was evaluated in Emergency Department on 09/03/2019 for the symptoms described in the history of present illness. He was evaluated in the context of the global COVID-19 pandemic, which necessitated consideration that the patient might be at risk for infection with the SARS-CoV-2 virus that causes COVID-19. Institutional protocols and algorithms that pertain to the evaluation of patients at risk for COVID-19 are in a state of rapid change based on information released by regulatory bodies including the CDC and federal and state organizations. These policies and algorithms were followed during the patient's care in the ED. ____________________________________________  FINAL CLINICAL IMPRESSION(S) / ED DIAGNOSES  Final diagnoses:   Foley catheter problem, initial encounter [facility])      Karmen Stabs, Charlesetta Ivory, PA-C 09/03/19 1911    Arnaldo Natal, MD 09/03/19 2035

## 2019-09-03 NOTE — ED Notes (Signed)
Leg strap applied as pt requested. Butterfly attachment left in place and leg strap applied to help hold foley line in place. Pt refused to have bag switched to leg bag.

## 2019-09-03 NOTE — ED Notes (Signed)
Foley securement tape changed in triage by this RN.

## 2019-09-03 NOTE — Discharge Summary (Signed)
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 Atrial fibrillation ([facility])   . Hypertension     HOSPITAL COURSE:  [name] a73 y.o.malewith a known history of hypertension and atrial fibrillationon Eliquiswho presented to the ED with hematuria that started this morning.   *Hypotension- may be due to mild hydration vs BP meds, lasix, or flomax. No signs of infection. BP has returned to normal with IVFs. -Will hold home lasix, losartan for now which can be resumed at later date if BP elevated -Continue home amiodarone, aldactone and metoprolol -feels better  *Hematuria with BPH and urinary rentention - patient had acute urinary retention 5 days ago and had a foley catheter placed. -CT abdomen/pelvis with 10 x 9 mm calculus in the urinary bladder in the region of the UVJ without ureteral dilation or hydronephrosis -Urology consult--with Dr Ronne Binning. Appreciated.  Already has appointment with [facility] for Monday -Continue flomax -Will not give any more antibiotics for possible UTI - patient is asymptomatic and UA is not consistent with UTI  *Acute blood loss anemia--due to hematuria--stable - likely due to hematuria. Hemoglobin has dropped a little bit from baseline. -Continue eliquis for now  *Lactic acidosis- resolved with IV  fluids. -Monitor  *Persistent atrial fibrillation- rate-controlled here -Continue home amiodarone  * Hyperlipidemia- stable -Continue lipitor and zetia  Overall better. D/c home  CONSULTS OBTAINED:  Treatment Team:  Malen Gauze, MD  DRUG ALLERGIES:   Allergies  Allergen Reactions  . Amoxicillin Anaphylaxis  . Sulfa Antibiotics Anaphylaxis  . Pravastatin Other (See Comments)  . Rosuvastatin Other (See Comments)    DISCHARGE MEDICATIONS:   Allergies as of 09/03/2019      Reactions   Amoxicillin Anaphylaxis   Sulfa Antibiotics Anaphylaxis   Pravastatin Other (See Comments)   Rosuvastatin Other (See Comments)      Medication List    STOP taking these medications   clopidogrel 75 MG tablet Commonly known as: PLAVIX   furosemide 20 MG tablet Commonly known as: LASIX   losartan 25 MG tablet Commonly known as: COZAAR     TAKE these medications   amiodarone 400 MG tablet Commonly known as: PACERONE Take 1 tablet (400 mg total) by mouth daily.   apixaban 5 MG Tabs tablet Commonly known as: Eliquis Take 1 tablet (5 mg total) by mouth 2 (two) times daily.   atorvastatin 10 MG tablet Commonly known as: LIPITOR Take 10 mg by mouth daily.   ezetimibe 10 MG tablet Commonly known as: ZETIA Take 10 mg by mouth daily.   fluticasone 50 MCG/ACT nasal spray Commonly known as: FLONASE Place 2 sprays into the nose daily.   metoprolol tartrate 25 MG tablet Commonly known as: LOPRESSOR Take 25 mg by mouth 2 (two) times daily.   pantoprazole 40 MG tablet Commonly known as: Protonix Take 1 tablet (40 mg total) by mouth daily.   spironolactone 25 MG tablet Commonly known as: ALDACTONE  Take 25 mg by mouth daily.   tamsulosin 0.4 MG Caps capsule Commonly known as: FLOMAX Take 0.4 mg by mouth daily.   Vitamin D3 1.25 MG (50000 UT) Caps Take 50,000 Units by mouth once a week.       If you experience worsening of your admission symptoms, develop  shortness of breath, life threatening emergency, suicidal or homicidal thoughts you must seek medical attention immediately by calling 911 or calling your MD immediately  if symptoms less severe.  You Must read complete instructions/literature along with all the possible adverse reactions/side effects for all the Medicines you take and that have been prescribed to you. Take any new Medicines after you have completely understood and accept all the possible adverse reactions/side effects.   Please note  You were cared for by a hospitalist during your hospital stay. If you have any questions about your discharge medications or the care you received while you were in the hospital after you are discharged, you can call the unit and asked to speak with the hospitalist on call if the hospitalist that took care of you is not available. Once you are discharged, your primary care physician will handle any further medical issues. Please note that NO REFILLS for any discharge medications will be authorized once you are discharged, as it is imperative that you return to your primary care physician (or establish a relationship with a primary care physician if you do not have one) for your aftercare needs so that they can reassess your need for medications and monitor your lab values. Today   SUBJECTIVE   Feels better today  VITAL SIGNS:  Blood pressure 122/70, pulse 67, temperature 97.7 F (36.5 C), temperature source Oral, resp. rate 20, height 5\' 8"  (1.727 m), weight 93.4 kg, SpO2 96 %.  I/O:    Intake/Output Summary (Last 24 hours) at 09/03/2019 0939 Last data filed at 09/03/2019 [id number] Gross per 24 hour  Intake 1734.61 ml  Output 700 ml  Net 1034.61 ml    PHYSICAL EXAMINATION:  GENERAL:  73 y.o.-year-old patient lying in the bed with no acute distress.  EYES: Pupils equal, round, reactive to light and accommodation. No scleral icterus. Extraocular muscles intact.  HEENT: Head atraumatic,  normocephalic. Oropharynx and nasopharynx clear.  NECK:  Supple, no jugular venous distention. No thyroid enlargement, no tenderness.  LUNGS: Normal breath sounds bilaterally, no wheezing, rales,rhonchi or crepitation. No use of accessory muscles of respiration.  CARDIOVASCULAR: S1, S2 normal. No murmurs, rubs, or gallops.  ABDOMEN: Soft, non-tender, non-distended. Bowel sounds present. No organomegaly or mass.  Foley+ EXTREMITIES: No pedal edema, cyanosis, or clubbing.  NEUROLOGIC: Cranial nerves II through XII are intact. Muscle strength 5/5 in all extremities. Sensation intact. Gait not checked.  PSYCHIATRIC: The patient is alert and oriented x 3.  SKIN: No obvious rash, lesion, or ulcer.   DATA REVIEW:   CBC  Recent Labs  Lab 09/02/19 0449  WBC 8.3  HGB 10.1*  HCT 31.8*  PLT 222    Chemistries  Recent Labs  Lab 09/01/19 1126 09/02/19 0449  NA 140 142  K 3.9 4.1  CL 103 109  CO2 24 27  GLUCOSE 169* 84  BUN 24* 17  CREATININE 1.18 1.03  CALCIUM 8.6* 8.2*  AST 17  --   ALT 8  --   ALKPHOS 62  --   BILITOT 1.8*  --     Microbiology Results   Recent Results (from the past 240 hour(s))  Culture,  blood (routine x 2)     Status: None (Preliminary result)   Collection Time: 09/01/19 11:33 AM   Specimen: BLOOD  Result Value Ref Range Status   Specimen Description BLOOD BLOOD RIGHT FOREARM  Final   Special Requests Blood Culture adequate volume  Final   Culture   Final    NO GROWTH 2 DAYS Performed at [facility], [address], [place], [place] [address]    Report Status PENDING  Incomplete  Urine culture     Status: None   Collection Time: 09/01/19 11:34 AM   Specimen: Urine, Random  Result Value Ref Range Status   Specimen Description   Final    URINE, RANDOM Performed at [facility], [address], [place], [place] [address]    Special Requests   Final    NONE Performed at [facility], [address], [place],  [place] [address]    Culture   Final    NO GROWTH Performed at [facility], [address], [place], [place] [address]    Report Status 09/02/2019 FINAL  Final  SARS CORONAVIRUS 2 (TAT 6-24 HRS) Nasopharyngeal Nasopharyngeal Swab     Status: None   Collection Time: 09/01/19  2:45 PM   Specimen: Nasopharyngeal Swab  Result Value Ref Range Status   SARS Coronavirus 2 NEGATIVE NEGATIVE Final    Comment: (NOTE) SARS-CoV-2 target nucleic acids are NOT DETECTED. The SARS-CoV-2 RNA is generally detectable in upper and lower respiratory specimens during the acute phase of infection. Negative results do not preclude SARS-CoV-2 infection, do not rule out co-infections with other pathogens, and should not be used as the sole basis for treatment or other patient management decisions. Negative results must be combined with clinical observations, patient history, and epidemiological information. The expected result is Negative. Fact Sheet for Patients: HairSlick.no Fact Sheet for Healthcare Providers: quierodirigir.com This test is not yet approved or cleared by the [place] FDA and  has been authorized for detection and/or diagnosis of SARS-CoV-2 by FDA under an Emergency Use Authorization (EUA). This EUA will remain  in effect (meaning this test can be used) for the duration of the COVID-19 declaration under Section 56 4(b)(1) of the Act, 21 U.S.C. section 360bbb-3(b)(1), unless the authorization is terminated or revoked sooner. Performed at [facility], [address], [place], [place] [address]     RADIOLOGY:  Ct Abdomen Pelvis W Contrast  Result Date: 09/01/2019 CLINICAL DATA:  Abdominal distension, hematuria EXAM: CT ABDOMEN AND PELVIS WITH CONTRAST TECHNIQUE: Multidetector CT imaging of the abdomen and pelvis was performed using the standard protocol following bolus administration of intravenous contrast. CONTRAST:   OMNIPAQUE IOHEXOL 300 MG/ML  SOLN COMPARISON:  None. FINDINGS: Lower chest: No acute abnormality. Hepatobiliary: No focal liver abnormality is seen. No gallstones, gallbladder wall thickening, or biliary dilatation. Pancreas: Unremarkable. No pancreatic ductal dilatation or surrounding inflammatory changes. Spleen: Normal in size without focal abnormality. Small splenule at the splenic hilum. Adrenals/Urinary Tract: Unremarkable adrenals. 3 mm nonobstructing calculus within the superior calyx of the left kidney. Kidneys are otherwise unremarkable without hydronephrosis. Ureters are nondilated. Rounded 10 x 9 mm calculus within the urinary bladder in the region of the right ureterovesical junction within additional 3 mm calculus adjacent to it. Urinary bladder is partially decompressed by Foley catheter. There is circumferential bladder wall thickening and mild surrounding fat stranding. Stomach/Bowel: Small hiatal hernia. Stomach and bowel unremarkable. No dilated loops of bowel. No focal  bowel wall thickening or inflammatory changes. A normal appendix is located in the right lower quadrant. Vascular/Lymphatic: Aortic atherosclerosis. No enlarged abdominal or pelvic lymph nodes. Reproductive: Prostate is unremarkable. Other: Small fat containing right inguinal hernia and umbilical hernia. No ascites. Musculoskeletal: Prior right total hip arthroplasty, partially visualized. Degenerative changes of the left hip. Dextrocurvature of the lumbar spine with mild right lateral listhesis L4 on L5. No acute osseous findings. IMPRESSION: 1. There is a 10 x 9 mm calculus within the urinary bladder in the region of the right UVJ without resultant ureteral dilation or hydronephrosis. If clinically indicated, a repeat CT of the pelvis in the prone position could be obtained to determine if the calculus is still partially located within the UVJ. 2. Circumferential thickening of the urinary bladder wall with mild associated  fat stranding. Correlate with urinalysis to assess for cystitis. 3. Nonobstructing 3 mm left renal calculus. 4. Small hiatal hernia. Electronically Signed   By: Davina Poke M.D.   On: 09/01/2019 13:40   Dg Chest Portable 1 View  Result Date: 09/01/2019 CLINICAL DATA:  Pt states noticing blood in his urinary catheter yesterday. Hx of hypertension, atrial fibrillation. Current smoker. EXAM: PORTABLE CHEST 1 VIEW COMPARISON:  Chest radiograph 04/19/2019, 11/25/2016 FINDINGS: Stable cardiomediastinal contours with enlarged heart size. Redemonstrated narrowing of the upper trachea. The lungs are clear. No pneumothorax or large pleural effusion. No acute finding in the visualized skeleton. IMPRESSION: 1.  No acute cardiopulmonary process. 2. Redemonstrated narrowing of the upper trachea as seen on prior studies. Electronically Signed   By: Audie Pinto M.D.   On: 09/01/2019 12:16     CODE STATUS:     Code Status Orders  (From admission, onward)         Start     Ordered   09/01/19 1706  Full code  Continuous     09/01/19 1705        Code Status History    Date Active Date Inactive Code Status Order ID Comments User Context   04/19/2019 1222 04/21/2019 2133 Full Code [id number]  Max Sane, MD Inpatient   Advance Care Planning Activity      TOTAL TIME TAKING CARE OF THIS PATIENT: *40* minutes.    Fritzi Mandes M.D on 09/03/2019 at 9:39 AM  Between 7am to 6pm - Pager - [phone] After 6pm go to [email or web address] - password [email or web address] [facility]  Office  [phone]  CC: Primary care physician; Lavera Guise, MD

## 2019-09-03 NOTE — Discharge Instructions (Signed)
Foley care per instructions 

## 2019-09-04 ENCOUNTER — Encounter: Payer: Self-pay | Admitting: Urology

## 2019-09-04 ENCOUNTER — Ambulatory Visit (INDEPENDENT_AMBULATORY_CARE_PROVIDER_SITE_OTHER): Payer: Medicare Other | Admitting: Urology

## 2019-09-04 VITALS — BP 117/73 | HR 70 | Ht 66.0 in | Wt 206.0 lb

## 2019-09-04 DIAGNOSIS — R339 Retention of urine, unspecified: Secondary | ICD-10-CM | POA: Diagnosis not present

## 2019-09-04 NOTE — Patient Instructions (Signed)
Cystoscopy Cystoscopy is a procedure that is used to help diagnose and sometimes treat conditions that affect the lower urinary tract. The lower urinary tract includes the bladder and the urethra. The urethra is the tube that drains urine from the bladder. Cystoscopy is done using a thin, tube-shaped instrument with a light and camera at the end (cystoscope). The cystoscope may be hard or flexible, depending on the goal of the procedure. The cystoscope is inserted through the urethra, into the bladder. Cystoscopy may be recommended if you have:  Urinary tract infections that keep coming back.  Blood in the urine (hematuria).  An inability to control when you urinate (urinary incontinence) or an overactive bladder.  Unusual cells found in a urine sample.  A blockage in the urethra, such as a urinary stone.  Painful urination.  An abnormality in the bladder found during an intravenous pyelogram (IVP) or CT scan. Cystoscopy may also be done to remove a sample of tissue to be examined under a microscope (biopsy). Tell a health care provider about:  Any allergies you have.  All medicines you are taking, including vitamins, herbs, eye drops, creams, and over-the-counter medicines.  Any problems you or family members have had with anesthetic medicines.  Any blood disorders you have.  Any surgeries you have had.  Any medical conditions you have.  Whether you are pregnant or may be pregnant. What are the risks? Generally, this is a safe procedure. However, problems may occur, including:  Infection.  Bleeding.  Allergic reactions to medicines.  Damage to other structures or organs. What happens before the procedure?  Ask your health care provider about: ? Changing or stopping your regular medicines. This is especially important if you are taking diabetes medicines or blood thinners. ? Taking medicines such as aspirin and ibuprofen. These medicines can thin your blood. Do not take  these medicines unless your health care provider tells you to take them. ? Taking over-the-counter medicines, vitamins, herbs, and supplements.  Follow instructions from your health care provider about eating or drinking restrictions.  Ask your health care provider what steps will be taken to help prevent infection. These may include: ? Washing skin with a germ-killing soap. ? Taking antibiotic medicine.  You may have an exam or testing, such as: ? X-rays of the bladder, urethra, or kidneys. ? Urine tests to check for signs of infection.  Plan to have someone take you home from the hospital or clinic. What happens during the procedure?   You will be given one or more of the following: ? A medicine to help you relax (sedative). ? A medicine to numb the area (local anesthetic).  The area around the opening of your urethra will be cleaned.  The cystoscope will be passed through your urethra into your bladder.  Germ-free (sterile) fluid will flow through the cystoscope to fill your bladder. The fluid will stretch your bladder so that your health care provider can clearly examine your bladder walls.  Your doctor will look at the urethra and bladder. Your doctor may take a biopsy or remove stones.  The cystoscope will be removed, and your bladder will be emptied. The procedure may vary among health care providers and hospitals. What can I expect after the procedure? After the procedure, it is common to have:  Some soreness or pain in your abdomen and urethra.  Urinary symptoms. These include: ? Mild pain or burning when you urinate. Pain should stop within a few minutes after you urinate. This   may last for up to 1 week. ? A small amount of blood in your urine for several days. ? Feeling like you need to urinate but producing only a small amount of urine. Follow these instructions at home: Medicines  Take over-the-counter and prescription medicines only as told by your health care  provider.  If you were prescribed an antibiotic medicine, take it as told by your health care provider. Do not stop taking the antibiotic even if you start to feel better. General instructions  Return to your normal activities as told by your health care provider. Ask your health care provider what activities are safe for you.  Do not drive for 24 hours if you were given a sedative during your procedure.  Watch for any blood in your urine. If the amount of blood in your urine increases, call your health care provider.  Follow instructions from your health care provider about eating or drinking restrictions.  If a tissue sample was removed for testing (biopsy) during your procedure, it is up to you to get your test results. Ask your health care provider, or the department that is doing the test, when your results will be ready.  Drink enough fluid to keep your urine pale yellow.  Keep all follow-up visits as told by your health care provider. This is important. Contact a health care provider if you:  Have pain that gets worse or does not get better with medicine, especially pain when you urinate.  Have trouble urinating.  Have more blood in your urine. Get help right away if you:  Have blood clots in your urine.  Have abdominal pain.  Have a fever or chills.  Are unable to urinate. Summary  Cystoscopy is a procedure that is used to help diagnose and sometimes treat conditions that affect the lower urinary tract.  Cystoscopy is done using a thin, tube-shaped instrument with a light and camera at the end.  After the procedure, it is common to have some soreness or pain in your abdomen and urethra.  Watch for any blood in your urine. If the amount of blood in your urine increases, call your health care provider.  If you were prescribed an antibiotic medicine, take it as told by your health care provider. Do not stop taking the antibiotic even if you start to feel better. This  information is not intended to replace advice given to you by your health care provider. Make sure you discuss any questions you have with your health care provider. Document Released: 11/20/2000 Document Revised: 11/15/2018 Document Reviewed: 11/15/2018 Elsevier Patient Education  2020 [employer]  

## 2019-09-04 NOTE — Progress Notes (Signed)
09/04/2019 8:15 AM   Roger [name] February 23, 1946 [id number]  Referring provider: Lyndon CodeKhan, Fozia M, MD [address] [place],  [place] [id number]  Chief Complaint  Patient presents with  . Urinary Retention    New patient    HPI: ED consult Dr Roger [name]: [name] is a 73yo with a history of Afib on eliquis, HTN, and BPH who presented to [facility] with new gross hematuria for the past 2 days. He was previously seen at [facility] in [place] 4 times in the past week for urinary retention and gross hematuria. He had a foley placed during his first ER presentation at [facility].. He has not previously been on BPH therapy.  He has severe LUTS at baseline. No previous hx of gross hematuria. No previous UTIs. He underwent CT [facility] which shows a 60g prostate with a 1.0cm bladder calculus. No flank pain. No suprapubic pain. No other associated symptoms. No exacerbating/alleviaitng events. Foley catheter is currently draining light brown urine. WBC count and Creatinine normal. Urine culture shows no growth    Based upon today's history the patient went into retention while visiting family.  Prior to this his flow was good and sometimes dribbling depending on fluid intake.  He was given at 1 or 2 times at night.  He is voiding every hour.  No history of kidney stones or kidney or bladder or prostate surgery.  No infection history.  He was not constipated at the time and bowel movements are regular  No neurologic issues and he is on Flomax  Modifying factors: There are no other modifying factors  Associated signs and symptoms: There are no other associated signs and symptoms Aggravating and relieving factors: There are no other aggravating or relieving factors Severity: Moderate Duration: Persistent     PMH: Past Medical History:  Diagnosis Date  . Atrial fibrillation (HCC)   . Hypertension     Surgical History: Past Surgical History:  Procedure Laterality Date  . CORONARY STENT  INTERVENTION N/A 04/20/2019   Procedure: CORONARY STENT INTERVENTION;  Surgeon: Alwyn Peaallwood, Dwayne D, MD;  Location: [facility] INVASIVE CV LAB;  Service: Cardiovascular;  Laterality: N/A;  . LEFT HEART CATH AND CORONARY ANGIOGRAPHY Left 04/20/2019   Procedure: LEFT HEART CATH AND CORONARY ANGIOGRAPHY;  Surgeon: Laurier NancyKhan, Shaukat A, MD;  Location: [facility] INVASIVE CV LAB;  Service: Cardiovascular;  Laterality: Left;    Home Medications:  Allergies as of 09/04/2019      Reactions   Amoxicillin Anaphylaxis   Sulfa Antibiotics Anaphylaxis   Pravastatin Other (See Comments)   Rosuvastatin Other (See Comments)      Medication List       Accurate as of September 04, 2019  8:15 AM. If you have any questions, ask your nurse or doctor.        amiodarone 400 MG tablet Commonly known as: PACERONE Take 1 tablet (400 mg total) by mouth daily.   apixaban 5 MG Tabs tablet Commonly known as: Eliquis Take 1 tablet (5 mg total) by mouth 2 (two) times daily.   atorvastatin 10 MG tablet Commonly known as: LIPITOR Take 10 mg by mouth daily.   ezetimibe 10 MG tablet Commonly known as: ZETIA Take 10 mg by mouth daily.   fluticasone 50 MCG/ACT nasal spray Commonly known as: FLONASE Place 2 sprays into the nose daily.   metoprolol tartrate 25 MG tablet Commonly known as: LOPRESSOR Take 25 mg by mouth 2 (two) times daily.   pantoprazole 40 MG tablet Commonly  known as: Protonix Take 1 tablet (40 mg total) by mouth daily.   spironolactone 25 MG tablet Commonly known as: ALDACTONE Take 25 mg by mouth daily.   tamsulosin 0.4 MG Caps capsule Commonly known as: FLOMAX Take 0.4 mg by mouth daily.   Vitamin D3 1.25 MG (50000 UT) Caps Take 50,000 Units by mouth once a week.       Allergies:  Allergies  Allergen Reactions  . Amoxicillin Anaphylaxis  . Sulfa Antibiotics Anaphylaxis  . Pravastatin Other (See Comments)  . Rosuvastatin Other (See Comments)    Family History: No family history on  file.  Social History:  reports that he has been smoking cigarettes. He has been smoking about 0.50 packs per day. He has never used smokeless tobacco. He reports current alcohol use. He reports that he does not use drugs.  ROS:                                        Physical Exam: Ht 5\' 8"  (1.727 [name])   BMI 31.32 kg/[name]   Constitutional:  Alert and oriented, No acute distress. HEENT:  AT, moist mucus membranes.  Trachea midline, no masses. Cardiovascular: No clubbing, cyanosis, or edema. Respiratory: Normal respiratory effort, no increased work of breathing. GI: Abdomen is soft, nontender, nondistended, no abdominal masses GU: No CVA tenderness.  40 to 50 g benign prostate Skin: No rashes, bruises or suspicious lesions. Lymph: No cervical or inguinal adenopathy. Neurologic: Grossly intact, no focal deficits, moving all 4 extremities. Psychiatric: Normal mood and affect.  Laboratory Data: Lab Results  Component Value Date   WBC 8.3 09/02/2019   HGB 10.1 (L) 09/02/2019   HCT 31.8 (L) 09/02/2019   MCV 95.8 09/02/2019   PLT 222 09/02/2019    Lab Results  Component Value Date   CREATININE 1.03 09/02/2019    No results found for: PSA  No results found for: TESTOSTERONE  No results found for: HGBA1C  Urinalysis    Component Value Date/Time   COLORURINE AMBER (A) 09/01/2019 1126   APPEARANCEUR CLOUDY (A) 09/01/2019 1126   LABSPEC 1.014 09/01/2019 1126   PHURINE 6.0 09/01/2019 1126   GLUCOSEU NEGATIVE 09/01/2019 1126   HGBUR LARGE (A) 09/01/2019 1126   BILIRUBINUR NEGATIVE 09/01/2019 1126   KETONESUR NEGATIVE 09/01/2019 1126   PROTEINUR 100 (A) 09/01/2019 1126   NITRITE NEGATIVE 09/01/2019 1126   LEUKOCYTESUR SMALL (A) 09/01/2019 1126    Pertinent Imaging: Reviewed x-ray with 1 cm stone and 3 small stones in bladder  Assessment & Plan: Patient had moderate lower urinary tract symptoms and present with acute retention.  Almost for certain the  1 cm stones in the bladder and not at the ureterovesical junction with no hydronephrosis.  He has been having some blood off and on in the catheter but is draining well.  He likely is not emptying well.  This likely represents a more chronic process.  Having said that I thought was reasonable given a trial of voiding recognizing the potential for the stone to cause acute obstruction.  The patient noted that he lives alone and felt more comfortable leaving the catheter in until he cystoscopy.  He did not want a trial of voiding today and come back this afternoon.  I will have him see Dr. 09/03/2019 for cystoscopy and likely a trial of voiding on that day.  I did not order urodynamics and  travel may be a concern for the patient.  He said if he needs to bladder stone and prostate eventually operated on he to rather leave the catheter in short-term.  I thought this was a reasonable request  There are no diagnoses linked to this encounter.  No follow-ups on file.  Reece Packer, MD  [facility] [address], [place], [place] [address] [phone]

## 2019-09-05 ENCOUNTER — Telehealth: Payer: Self-pay | Admitting: Urology

## 2019-09-05 NOTE — Telephone Encounter (Signed)
Pt wants to know if he needs to stop his blood thinners prior to cysto.  Also, he said the directions say he cannot drive for 24 hours after given a sedative.  Please call pt to answer his questions prior to cysto.

## 2019-09-06 ENCOUNTER — Emergency Department
Admission: EM | Admit: 2019-09-06 | Discharge: 2019-09-06 | Disposition: A | Discharge status: Home / Self Care | Payer: Medicare Other | Attending: Emergency Medicine | Admitting: Emergency Medicine

## 2019-09-06 ENCOUNTER — Other Ambulatory Visit: Payer: Self-pay

## 2019-09-06 DIAGNOSIS — I10 Essential (primary) hypertension: Secondary | ICD-10-CM | POA: Insufficient documentation

## 2019-09-06 DIAGNOSIS — R310 Gross hematuria: Secondary | ICD-10-CM | POA: Insufficient documentation

## 2019-09-06 DIAGNOSIS — F17210 Nicotine dependence, cigarettes, uncomplicated: Secondary | ICD-10-CM | POA: Diagnosis not present

## 2019-09-06 DIAGNOSIS — Z79899 Other long term (current) drug therapy: Secondary | ICD-10-CM | POA: Insufficient documentation

## 2019-09-06 DIAGNOSIS — Z955 Presence of coronary angioplasty implant and graft: Secondary | ICD-10-CM | POA: Insufficient documentation

## 2019-09-06 DIAGNOSIS — Z7901 Long term (current) use of anticoagulants: Secondary | ICD-10-CM | POA: Diagnosis not present

## 2019-09-06 DIAGNOSIS — R319 Hematuria, unspecified: Secondary | ICD-10-CM | POA: Diagnosis present

## 2019-09-06 LAB — URINALYSIS, COMPLETE (UACMP) WITH MICROSCOPIC
Bacteria, UA: NONE SEEN
Bilirubin Urine: NEGATIVE
Glucose, UA: NEGATIVE mg/dL
Ketones, ur: NEGATIVE mg/dL
Leukocytes,Ua: NEGATIVE
Nitrite: NEGATIVE
Protein, ur: 100 mg/dL — AB
RBC / HPF: >50 RBC/hpf — ABNORMAL HIGH (ref 0–5)
Specific Gravity, Urine: 1.009 (ref 1.005–1.030)
Squamous Epithelial / HPF: NONE SEEN (ref 0–5)
WBC, UA: NONE SEEN WBC/hpf (ref 0–5)
pH: 6 (ref 5.0–8.0)

## 2019-09-06 LAB — CULTURE, BLOOD (ROUTINE X 2)
Culture: NO GROWTH
Special Requests: ADEQUATE

## 2019-09-06 LAB — CBC
HCT: 35.5 % — ABNORMAL LOW (ref 39.0–52.0)
Hemoglobin: 11.4 g/dL — ABNORMAL LOW (ref 13.0–17.0)
MCH: 30 pg (ref 26.0–34.0)
MCHC: 32.1 g/dL (ref 30.0–36.0)
MCV: 93.4 fL (ref 80.0–100.0)
Platelets: 310 10*3/uL (ref 150–400)
RBC: 3.8 MIL/uL — ABNORMAL LOW (ref 4.22–5.81)
RDW: 14.8 % (ref 11.5–15.5)
WBC: 9.9 10*3/uL (ref 4.0–10.5)
nRBC: 0 % (ref 0.0–0.2)

## 2019-09-06 LAB — BASIC METABOLIC PANEL
Anion gap: 11 (ref 5–15)
BUN: 15 mg/dL (ref 8–23)
CO2: 25 mmol/L (ref 22–32)
Calcium: 8.9 mg/dL (ref 8.9–10.3)
Chloride: 103 mmol/L (ref 98–111)
Creatinine, Ser: 1.08 mg/dL (ref 0.61–1.24)
GFR calc Af Amer: >60 mL/min (ref >60)
GFR calc non Af Amer: >60 mL/min (ref >60)
Glucose, Bld: 114 mg/dL — ABNORMAL HIGH (ref 70–99)
Potassium: 3.2 mmol/L — ABNORMAL LOW (ref 3.5–5.1)
Sodium: 139 mmol/L (ref 135–145)

## 2019-09-06 MED ORDER — POTASSIUM CHLORIDE CRYS ER 20 MEQ PO TBCR
40.0000 meq | EXTENDED_RELEASE_TABLET | Freq: Once | ORAL | Status: AC
  Administered 2019-09-06: 40 meq via ORAL
  Filled 2019-09-06: qty 2

## 2019-09-06 NOTE — ED Triage Notes (Signed)
Pt to the er for blood in urine. Pt had a foley catheter placed due to inability to urinate last Monday. Pt dx with kidney stones. Other procedures planned. Pt saw urologist this morning but pt was not bleeding at that time.

## 2019-09-06 NOTE — Telephone Encounter (Signed)
Patient present to office today and was given instruction that his procedure would be in office and no prep needed

## 2019-09-06 NOTE — ED Provider Notes (Signed)
[facility] Emergency Department Provider Note   ____________________________________________   First MD Initiated Contact with Patient 09/06/19 1914     (approximate)  I have reviewed the triage vital signs and the nursing notes.   HISTORY  Chief Complaint Hematuria    HPI Roger [name] is a 73 y.o. male history of recent urinary tract infection, hypotension A. fib on anticoagulation  Patient has had a Foley catheter now he reports for about a week, initially was placed when he was in [place].  Was recently discharged from the hospital for urinary tract infection and low blood pressure.  He reports his been doing well except he is occasionally seeing some blood tingeing in his urine, saw urologist today and they reassured him.  He reports this evening he has noticed a little more blood in it, he saw a very small string of clot about an inch or too long but passed through the bag and is not feeling like he is blocked up or retaining.  He is continued to see tingeing of blood to the urine.  He is emptied 800 mL's of urine from his bag today and it continues to drain normally  No fevers chills or weakness.  Denies any COVID symptoms.  Of note he was tested negative for COVID recently and he has plans to follow-up for cystoscopy with urology and is already following closely with them   Past Medical History:  Diagnosis Date  . Atrial fibrillation (HCC)   . Hypertension   . Kidney stones     Patient Active Problem List   Diagnosis Date Noted  . Hypotension 09/01/2019  . Ventricular fibrillation (HCC) 04/19/2019    Past Surgical History:  Procedure Laterality Date  . CATARACT EXTRACTION    . CORONARY STENT INTERVENTION N/A 04/20/2019   Procedure: CORONARY STENT INTERVENTION;  Surgeon: Alwyn Peaallwood, Dwayne D, MD;  Location: [facility] INVASIVE CV LAB;  Service: Cardiovascular;  Laterality: N/A;  . HIP SURGERY    . LEFT HEART CATH AND CORONARY ANGIOGRAPHY Left  04/20/2019   Procedure: LEFT HEART CATH AND CORONARY ANGIOGRAPHY;  Surgeon: Laurier NancyKhan, Shaukat A, MD;  Location: [facility] INVASIVE CV LAB;  Service: Cardiovascular;  Laterality: Left;    Prior to Admission medications   Medication Sig Start Date End Date Taking? Authorizing Provider  amiodarone (PACERONE) 400 MG tablet Take 1 tablet (400 mg total) by mouth daily. 04/21/19   Adrian SaranMody, Sital, MD  apixaban (ELIQUIS) 5 MG TABS tablet Take 1 tablet (5 mg total) by mouth 2 (two) times daily. 04/21/19   Adrian SaranMody, Sital, MD  atorvastatin (LIPITOR) 10 MG tablet Take 10 mg by mouth daily. 05/22/19   [provider]  Cholecalciferol (VITAMIN D3) 1.25 MG (50000 UT) CAPS Take 50,000 Units by mouth once a week. 05/02/19   [provider]  ezetimibe (ZETIA) 10 MG tablet Take 10 mg by mouth daily. 08/13/19   [provider]  fluticasone (FLONASE) 50 MCG/ACT nasal spray Place 2 sprays into the nose daily. 04/08/19 04/07/20  [provider]  metoprolol tartrate (LOPRESSOR) 25 MG tablet Take 25 mg by mouth 2 (two) times daily.    [provider]  pantoprazole (PROTONIX) 40 MG tablet Take 1 tablet (40 mg total) by mouth daily. 04/21/19   Adrian SaranMody, Sital, MD  spironolactone (ALDACTONE) 25 MG tablet Take 25 mg by mouth daily. 08/13/19   [provider]  tamsulosin (FLOMAX) 0.4 MG CAPS capsule Take 0.4 mg by mouth daily. 08/29/19   [provider]  lisinopril (PRINIVIL,ZESTRIL) 10 MG tablet Take 5 mg by mouth daily.   06/10/19  [provider]    Allergies Amoxicillin, Sulfa antibiotics, Pravastatin, and Rosuvastatin  No family history on file.  Social History Social History   Tobacco Use  . Smoking status: Current Every Day Smoker    Packs/day: 0.50    Types: Cigarettes  . Smokeless tobacco: Never Used  Substance Use Topics  . Alcohol use: Yes    Comment: rare  . Drug use: No    Review of Systems Constitutional: No fever/chills is feeling well other than seeing  blood in his urine Eyes: No visual changes. ENT: No sore throat. Cardiovascular: Denies chest pain. Respiratory: Denies shortness of breath. Gastrointestinal: No abdominal pain.   Genitourinary: Negative for dysuria.  No penile pain.  No testicular swelling or redness. Musculoskeletal: Negative for back pain. Skin: Negative for rash. Neurological: Negative for headaches, areas of focal weakness or numbness.    ____________________________________________   PHYSICAL EXAM:  VITAL SIGNS: ED Triage Vitals [09/06/19 1849]  Enc Vitals Group     BP (!) 142/66     Pulse Rate 92     Resp 18     Temp 98.3 F (36.8 C)     Temp Source Oral     SpO2      Weight 206 lb (93.4 kg)     Height 5\' 8"  (1.727 m)     Head Circumference      Peak Flow      Pain Score 0     Pain Loc      Pain Edu?      Excl. in GC?     Constitutional: Alert and oriented. Well appearing and in no acute distress. Eyes: Conjunctivae are normal. Head: Atraumatic. Nose: No congestion/rhinnorhea. Mouth/Throat: Mucous membranes are moist. Neck: No stridor.  Cardiovascular: Normal rate, regular rhythm. Grossly normal heart sounds.  Good peripheral circulation. Respiratory: Normal respiratory effort.  No retractions. Lungs CTAB. Gastrointestinal: Soft and nontender. No distention. Genitourinary: Nondistended lower abdomen.  Circumcised penis.  Normal urethra, Foley catheter draining lightly blood-tinged to purplish colored urine.  No noted clot formation.  800 mL's of blood-tinged urine noted in Foley catheter bag, no thick clots or active red bleeding normal testicles and scrotum. Musculoskeletal: No lower extremity tenderness nor edema. Neurologic:  Normal speech and language. No gross focal neurologic deficits are appreciated.  Skin:  Skin is warm, dry and intact. No rash noted. Psychiatric: Mood and affect are normal. Speech and behavior are normal.  ____________________________________________   LABS (all  labs ordered are listed, but only abnormal results are displayed)  Labs Reviewed  URINALYSIS, COMPLETE (UACMP) WITH MICROSCOPIC - Abnormal; Notable for the following components:      Result Value   Color, Urine ORANGE (*)    APPearance CLOUDY (*)    Hgb urine dipstick LARGE (*)    Protein, ur 100 (*)    RBC / HPF >50 (*)    All other components within normal limits  CBC - Abnormal; Notable for the following components:   RBC 3.80 (*)    Hemoglobin 11.4 (*)    HCT 35.5 (*)    All other components within normal limits  BASIC METABOLIC PANEL - Abnormal; Notable for the following components:   Potassium 3.2 (*)    Glucose, Bld 114 (*)    All other components within normal limits   ____________________________________________  EKG   ____________________________________________  RADIOLOGY  ____________________________________________   PROCEDURES  Procedure(s) performed: None  Procedures  Critical Care performed: No  ____________________________________________   INITIAL IMPRESSION / ASSESSMENT AND PLAN / ED COURSE  Pertinent labs & imaging results that were available during my care of the patient were reviewed by me and considered in my medical decision making (see chart for details).   Patient developing hematuria, has close urology follow-up concerns for bladder stone and also recent urinary tract infection.  Denies any infectious symptoms.  Lab work reassuring.  Recent negative urine culture.  Saw urologist today, appears to be having some ongoing hematuria and is anticoagulated but no evidence of severe hematuria.  Hemoglobin slightly improved.  Slightly reduced potassium, will give oral repletion here.  Discussed with the patient careful return precautions, he is agreement with the plan and will follow closely with urology.  He will come back if he develops increased bleeding, lightheadedness weakness feels like he is unable to urinate or is not draining from the  Foley catheter fevers rash or other concerns.  Return precautions and treatment recommendations and follow-up discussed with the patient who is agreeable with the plan.       ____________________________________________   FINAL CLINICAL IMPRESSION(S) / ED DIAGNOSES  Final diagnoses:  Gross hematuria        Note:  This document was prepared using Dragon voice recognition software and may include unintentional dictation errors       Delman Kitten, MD 09/06/19 1941

## 2019-09-08 ENCOUNTER — Emergency Department
Admission: EM | Admit: 2019-09-08 | Discharge: 2019-09-08 | Disposition: A | Discharge status: Home / Self Care | Payer: Medicare Other | Attending: Emergency Medicine | Admitting: Emergency Medicine

## 2019-09-08 ENCOUNTER — Other Ambulatory Visit: Payer: Self-pay

## 2019-09-08 ENCOUNTER — Encounter: Payer: Self-pay | Admitting: Emergency Medicine

## 2019-09-08 DIAGNOSIS — Z7901 Long term (current) use of anticoagulants: Secondary | ICD-10-CM | POA: Insufficient documentation

## 2019-09-08 DIAGNOSIS — Z955 Presence of coronary angioplasty implant and graft: Secondary | ICD-10-CM | POA: Diagnosis not present

## 2019-09-08 DIAGNOSIS — Z79899 Other long term (current) drug therapy: Secondary | ICD-10-CM | POA: Diagnosis not present

## 2019-09-08 DIAGNOSIS — I10 Essential (primary) hypertension: Secondary | ICD-10-CM | POA: Diagnosis not present

## 2019-09-08 DIAGNOSIS — R310 Gross hematuria: Secondary | ICD-10-CM | POA: Insufficient documentation

## 2019-09-08 DIAGNOSIS — F17210 Nicotine dependence, cigarettes, uncomplicated: Secondary | ICD-10-CM | POA: Diagnosis not present

## 2019-09-08 DIAGNOSIS — R339 Retention of urine, unspecified: Secondary | ICD-10-CM | POA: Diagnosis present

## 2019-09-08 LAB — URINALYSIS, COMPLETE (UACMP) WITH MICROSCOPIC
Bilirubin Urine: NEGATIVE
Glucose, UA: NEGATIVE mg/dL
Ketones, ur: NEGATIVE mg/dL
Leukocytes,Ua: NEGATIVE
Nitrite: NEGATIVE
Protein, ur: 100 mg/dL — AB
RBC / HPF: >50 RBC/hpf — ABNORMAL HIGH (ref 0–5)
Specific Gravity, Urine: 1.012 (ref 1.005–1.030)
pH: 6 (ref 5.0–8.0)

## 2019-09-08 NOTE — ED Provider Notes (Signed)
[facility] Emergency Department Provider Note   ____________________________________________   First MD Initiated Contact with Patient 09/08/19 1847     (approximate)  I have reviewed the triage vital signs and the nursing notes.   HISTORY  Chief Complaint Urinary Retention    HPI Roger [name] is a 73 y.o. male 73 year old male with past medical history of hypertension, A. fib on anticoagulation presents to the ED for urinary retention.  Patient reports that he noted leakage of urine from around his Foley catheter earlier today.  He states that otherwise urine has been flowing freely through the catheter and he has not had any abdominal pain.  He noticed the leakage while he was cleaning the area, but has not noticed any further leakage.  He does have ongoing hematuria, but states this is improved compared to his prior ED visit.  He is scheduled for cystoscopy next week.        Past Medical History:  Diagnosis Date  . Atrial fibrillation (HCC)   . Hypertension   . Kidney stones     Patient Active Problem List   Diagnosis Date Noted  . Hypotension 09/01/2019  . Ventricular fibrillation (HCC) 04/19/2019    Past Surgical History:  Procedure Laterality Date  . CATARACT EXTRACTION    . CORONARY STENT INTERVENTION N/A 04/20/2019   Procedure: CORONARY STENT INTERVENTION;  Surgeon: Alwyn Pea, MD;  Location: [facility] INVASIVE CV LAB;  Service: Cardiovascular;  Laterality: N/A;  . HIP SURGERY    . LEFT HEART CATH AND CORONARY ANGIOGRAPHY Left 04/20/2019   Procedure: LEFT HEART CATH AND CORONARY ANGIOGRAPHY;  Surgeon: Laurier Nancy, MD;  Location: [facility] INVASIVE CV LAB;  Service: Cardiovascular;  Laterality: Left;    Prior to Admission medications   Medication Sig Start Date End Date Taking? Authorizing Provider  amiodarone (PACERONE) 400 MG tablet Take 1 tablet (400 mg total) by mouth daily. 04/21/19   Adrian Saran, MD  apixaban (ELIQUIS) 5 MG  TABS tablet Take 1 tablet (5 mg total) by mouth 2 (two) times daily. 04/21/19   Adrian Saran, MD  atorvastatin (LIPITOR) 10 MG tablet Take 10 mg by mouth daily. 05/22/19   [provider]  Cholecalciferol (VITAMIN D3) 1.25 MG (50000 UT) CAPS Take 50,000 Units by mouth once a week. 05/02/19   [provider]  ezetimibe (ZETIA) 10 MG tablet Take 10 mg by mouth daily. 08/13/19   [provider]  fluticasone (FLONASE) 50 MCG/ACT nasal spray Place 2 sprays into the nose daily. 04/08/19 04/07/20  [provider]  metoprolol tartrate (LOPRESSOR) 25 MG tablet Take 25 mg by mouth 2 (two) times daily.    [provider]  pantoprazole (PROTONIX) 40 MG tablet Take 1 tablet (40 mg total) by mouth daily. 04/21/19   Adrian Saran, MD  spironolactone (ALDACTONE) 25 MG tablet Take 25 mg by mouth daily. 08/13/19   [provider]  tamsulosin (FLOMAX) 0.4 MG CAPS capsule Take 0.4 mg by mouth daily. 08/29/19   [provider]  lisinopril (PRINIVIL,ZESTRIL) 10 MG tablet Take 5 mg by mouth daily.   06/10/19  [provider]    Allergies Amoxicillin, Sulfa antibiotics, Pravastatin, and Rosuvastatin  History reviewed. No pertinent family history.  Social History Social History   Tobacco Use  . Smoking status: Current Every Day Smoker    Packs/day: 0.50    Types: Cigarettes  . Smokeless tobacco: Never Used  Substance Use Topics  . Alcohol use: Yes  Comment: rare  . Drug use: No    Review of Systems  Constitutional: No fever/chills Eyes: No visual changes. ENT: No sore throat. Cardiovascular: Denies chest pain. Respiratory: Denies shortness of breath. Gastrointestinal: No abdominal pain.  No nausea, no vomiting.  No diarrhea.  No constipation. Genitourinary: Negative for dysuria.  Positive for hematuria. Musculoskeletal: Negative for back pain. Skin: Negative for rash. Neurological: Negative for headaches, focal weakness or numbness.   ____________________________________________   PHYSICAL EXAM:  VITAL SIGNS: ED Triage Vitals  Enc Vitals Group     BP 09/08/19 1844 137/66     Pulse Rate 09/08/19 1844 78     Resp 09/08/19 1844 15     Temp 09/08/19 1844 98.3 F (36.8 C)     Temp Source 09/08/19 1844 Oral     SpO2 09/08/19 1842 96 %     Weight 09/08/19 1846 206 lb (93.4 kg)     Height 09/08/19 1846 5\' 8"  (1.727 m)     Head Circumference --      Peak Flow --      Pain Score 09/08/19 1845 0     Pain Loc --      Pain Edu? --      Excl. in [place]? --     Constitutional: Alert and oriented. Eyes: Conjunctivae are normal. Head: Atraumatic. Nose: No congestion/rhinnorhea. Mouth/Throat: Mucous membranes are moist. Neck: Normal ROM Cardiovascular: Normal rate, regular rhythm. Grossly normal heart sounds. Respiratory: Normal respiratory effort.  No retractions. Lungs CTAB. Gastrointestinal: Soft and nontender. No distention. Genitourinary: Foley catheter in place with no leakage noted, gross hematuria noted in collection bag. Musculoskeletal: No lower extremity tenderness nor edema. Neurologic:  Normal speech and language. No gross focal neurologic deficits are appreciated. Skin:  Skin is warm, dry and intact. No rash noted. Psychiatric: Mood and affect are normal. Speech and behavior are normal.  ____________________________________________   LABS (all labs ordered are listed, but only abnormal results are displayed)  Labs Reviewed  URINALYSIS, COMPLETE (UACMP) WITH MICROSCOPIC - Abnormal; Notable for the following components:      Result Value   Color, Urine AMBER (*)    APPearance CLOUDY (*)    Hgb urine dipstick LARGE (*)    Protein, ur 100 (*)    RBC / HPF >50 (*)    Bacteria, UA RARE (*)    All other components within normal limits     PROCEDURES  Procedure(s) performed (including Critical Care):  Procedures   ____________________________________________   INITIAL IMPRESSION / ASSESSMENT  AND PLAN / ED COURSE       73 year old male presents to the ED with concerns for urinary retention.  Urine appears to be flowing freely at this time and bladder scan shows approximately 100 cc of urine, doubt acute obstruction.  He does have ongoing hematuria, however he states this is improved from prior, do not suspect UTI.  He did have some irritation at the tip of his penis which appeared due to the positioning of the Foley catheter.  This was adjusted so that Foley was no longer tugging on the tip of his penis.  Counseled patient to follow-up with urology and return to the ED for new or worsening symptoms, patient agrees with plan.      ____________________________________________   FINAL CLINICAL IMPRESSION(S) / ED DIAGNOSES  Final diagnoses:  Gross hematuria     ED Discharge Orders    None       Note:  This document was prepared  using Conservation officer, historic buildingsDragon voice recognition software and may include unintentional dictation errors.   Chesley NoonJessup, Tienna Bienkowski, MD 09/09/19 [phone]

## 2019-09-08 NOTE — ED Notes (Signed)
161mls on bladder scan, 10 mls of sterile saline flushed for flow check with no return

## 2019-09-08 NOTE — ED Notes (Signed)
Urine collected from line of catheter

## 2019-09-08 NOTE — ED Notes (Signed)
Pt meatus has discharge and dried blood, foul smelling, yeast infection on left inner groin, pt cleaned and "teach back" conducted

## 2019-09-09 NOTE — ED Notes (Signed)
No peripheral IV placed this visit.   Discharge instructions reviewed with patient. Questions fielded by this RN. Patient verbalizes understanding of instructions. Patient discharged home in stable condition per jessup. No acute distress noted at time of discharge.   Pt wheeled to lobby to wait for cab  [facility] called at [phone]

## 2019-09-10 ENCOUNTER — Emergency Department
Admission: EM | Admit: 2019-09-10 | Discharge: 2019-09-10 | Discharge status: Absent without leave | Payer: Medicare Other

## 2019-09-10 ENCOUNTER — Other Ambulatory Visit: Payer: Self-pay

## 2019-09-10 DIAGNOSIS — Z436 Encounter for attention to other artificial openings of urinary tract: Secondary | ICD-10-CM | POA: Diagnosis present

## 2019-09-10 DIAGNOSIS — Z7901 Long term (current) use of anticoagulants: Secondary | ICD-10-CM | POA: Insufficient documentation

## 2019-09-10 DIAGNOSIS — F17210 Nicotine dependence, cigarettes, uncomplicated: Secondary | ICD-10-CM | POA: Diagnosis not present

## 2019-09-10 DIAGNOSIS — Z79899 Other long term (current) drug therapy: Secondary | ICD-10-CM | POA: Insufficient documentation

## 2019-09-10 DIAGNOSIS — N390 Urinary tract infection, site not specified: Secondary | ICD-10-CM | POA: Diagnosis not present

## 2019-09-10 DIAGNOSIS — I10 Essential (primary) hypertension: Secondary | ICD-10-CM | POA: Insufficient documentation

## 2019-09-10 NOTE — ED Triage Notes (Signed)
Pt to the er for urinary catheter issues. Pt states he had a big rush of urine tonight and some went into the drainage bag and some went all over him. Pt is worried about the placement of catheter.

## 2019-09-11 ENCOUNTER — Telehealth: Payer: Self-pay | Admitting: Emergency Medicine

## 2019-09-11 ENCOUNTER — Emergency Department: Payer: Medicare Other

## 2019-09-11 ENCOUNTER — Emergency Department
Admission: EM | Admit: 2019-09-11 | Discharge: 2019-09-11 | Disposition: A | Discharge status: Home / Self Care | Payer: Medicare Other | Attending: Emergency Medicine | Admitting: Emergency Medicine

## 2019-09-11 ENCOUNTER — Telehealth: Payer: Self-pay | Admitting: Urology

## 2019-09-11 DIAGNOSIS — T839XXD Unspecified complication of genitourinary prosthetic device, implant and graft, subsequent encounter: Secondary | ICD-10-CM

## 2019-09-11 DIAGNOSIS — N390 Urinary tract infection, site not specified: Secondary | ICD-10-CM

## 2019-09-11 LAB — URINALYSIS, COMPLETE (UACMP) WITH MICROSCOPIC
Bacteria, UA: NONE SEEN
Bilirubin Urine: NEGATIVE
Glucose, UA: NEGATIVE mg/dL
Ketones, ur: NEGATIVE mg/dL
Nitrite: NEGATIVE
Protein, ur: 100 mg/dL — AB
RBC / HPF: >50 RBC/hpf — ABNORMAL HIGH (ref 0–5)
Specific Gravity, Urine: 1.02 (ref 1.005–1.030)
WBC, UA: NONE SEEN WBC/hpf (ref 0–5)
pH: 6 (ref 5.0–8.0)

## 2019-09-11 MED ORDER — CIPROFLOXACIN HCL 500 MG PO TABS: 500.0000 mg | ORAL_TABLET | Freq: Two times a day (BID) | ORAL | 0 refills | Status: AC

## 2019-09-11 MED ORDER — CIPROFLOXACIN HCL 500 MG PO TABS
500.0000 mg | ORAL_TABLET | ORAL | Status: AC
  Administered 2019-09-11: 03:00:00 500 mg via ORAL
  Filled 2019-09-11: qty 1

## 2019-09-11 MED ORDER — CIPROFLOXACIN HCL 500 MG PO TABS: 500.0000 mg | ORAL_TABLET | ORAL | Status: DC

## 2019-09-11 NOTE — Telephone Encounter (Signed)
Pt called and states that he was seen in the ER yesterday and they recommend that he be seen sooner than his appt on 09/20/2019. I didn't see any notes in chart about that. Please advise.

## 2019-09-11 NOTE — ED Notes (Signed)
No peripheral IV placed this visit.   Discharge instructions reviewed with patient. Questions fielded by this RN. Patient verbalizes understanding of instructions. Patient discharged home in stable condition per forbach. No acute distress noted at time of discharge.   

## 2019-09-11 NOTE — Telephone Encounter (Signed)
I messaged Olivia Mackie to offer him [employer], thanks  Nickolas Madrid, MD 09/11/2019

## 2019-09-11 NOTE — ED Notes (Signed)
Sterile saline 10 mls flushed into catheter port and returned, EDP notified  X-ray at bedside

## 2019-09-11 NOTE — Discharge Instructions (Signed)
Since you are now having some pus drained from the end of your penis, I have ordered some antibiotics for you.  The urologist may decide to change the antibiotic or discontinue it altogether.  I have sent a message through the computer to the urology providers and they may reach out to you to see if you can have an appointment before your scheduled appointment.  You can also contact the office and ask for a sooner appointment if one is available.  Please continue to take your regular medications and drain your leg bag as previously instructed.  Return to the emergency department if you develop new or worsening symptoms that concern you.

## 2019-09-11 NOTE — Telephone Encounter (Signed)
Does patient need a sooner appointment?

## 2019-09-11 NOTE — ED Notes (Signed)
Pt reports "a sudden gush" of urine around catheter earlier today and wants "to get it checked out"; pt seen by this RN on Friday, meatus still appears abraded with pus at lower aspect, urine and serosanguinous discharge in underwear, pt comment that amber urine in bag (approx 150 mls) was draining yellow prior  Pt has extensively taped the drain in place to prevent movement; pt has urologist appt on 14 Oct, coude was inserted with difficultly in [place] approx 2 weeks prior

## 2019-09-11 NOTE — ED Provider Notes (Signed)
[facility] Emergency Department Provider Note  ____________________________________________   First MD Initiated Contact with Patient 09/11/19 0116     (approximate)  I have reviewed the triage vital signs and the nursing notes.   HISTORY  Chief Complaint urinary catheter issues    HPI Roger [name] is a 73 y.o. male with medical history as listed below who currently has an indwelling Foley catheter that was placed 2 weeks ago in [place] for acute urinary retention.  He presents tonight for his fifth visit in about a week for Foley catheter issues.  He says that he has been leaking around the catheter and having some blood in it (he takes blood thinners) and has a little bit of  pus leaking from the tip of his penis.  He has no fever chills, chest pain or shortness of breath, nausea, vomiting, and abdominal pain.  He has an appointment scheduled for a cystogram with Dr. Richardo HanksSninsky on October 14.  It is unclear if he has yet followed up in the urology clinic for all of the catheter issues in spite of the multiple ED visits.  He has not been on antibiotics recently.        Past Medical History:  Diagnosis Date  . Atrial fibrillation (HCC)   . Hypertension   . Kidney stones     Patient Active Problem List   Diagnosis Date Noted  . Hypotension 09/01/2019  . Ventricular fibrillation (HCC) 04/19/2019    Past Surgical History:  Procedure Laterality Date  . CATARACT EXTRACTION    . CORONARY STENT INTERVENTION N/A 04/20/2019   Procedure: CORONARY STENT INTERVENTION;  Surgeon: Alwyn Peaallwood, Dwayne D, MD;  Location: [facility] INVASIVE CV LAB;  Service: Cardiovascular;  Laterality: N/A;  . HIP SURGERY    . LEFT HEART CATH AND CORONARY ANGIOGRAPHY Left 04/20/2019   Procedure: LEFT HEART CATH AND CORONARY ANGIOGRAPHY;  Surgeon: Laurier NancyKhan, Shaukat A, MD;  Location: [facility] INVASIVE CV LAB;  Service: Cardiovascular;  Laterality: Left;    Prior to Admission medications    Medication Sig Start Date End Date Taking? Authorizing Provider  amiodarone (PACERONE) 400 MG tablet Take 1 tablet (400 mg total) by mouth daily. 04/21/19   Adrian SaranMody, Sital, MD  apixaban (ELIQUIS) 5 MG TABS tablet Take 1 tablet (5 mg total) by mouth 2 (two) times daily. 04/21/19   Adrian SaranMody, Sital, MD  atorvastatin (LIPITOR) 10 MG tablet Take 10 mg by mouth daily. 05/22/19   [provider]  Cholecalciferol (VITAMIN D3) 1.25 MG (50000 UT) CAPS Take 50,000 Units by mouth once a week. 05/02/19   [provider]  ciprofloxacin (CIPRO) 500 MG tablet Take 1 tablet (500 mg total) by mouth 2 (two) times daily for 10 days. 09/11/19 09/21/19  Loleta RoseForbach, Dani Danis, MD  ezetimibe (ZETIA) 10 MG tablet Take 10 mg by mouth daily. 08/13/19   [provider]  fluticasone (FLONASE) 50 MCG/ACT nasal spray Place 2 sprays into the nose daily. 04/08/19 04/07/20  [provider]  metoprolol tartrate (LOPRESSOR) 25 MG tablet Take 25 mg by mouth 2 (two) times daily.    [provider]  pantoprazole (PROTONIX) 40 MG tablet Take 1 tablet (40 mg total) by mouth daily. 04/21/19   Adrian SaranMody, Sital, MD  spironolactone (ALDACTONE) 25 MG tablet Take 25 mg by mouth daily. 08/13/19   [provider]  tamsulosin (FLOMAX) 0.4 MG CAPS capsule Take 0.4 mg by mouth daily. 08/29/19   [provider]  lisinopril (PRINIVIL,ZESTRIL) 10 MG tablet  Take 5 mg by mouth daily.   06/10/19  [provider]    Allergies Amoxicillin, Sulfa antibiotics, Pravastatin, and Rosuvastatin  No family history on file.  Social History Social History   Tobacco Use  . Smoking status: Current Every Day Smoker    Packs/day: 0.50    Types: Cigarettes  . Smokeless tobacco: Never Used  Substance Use Topics  . Alcohol use: Yes    Comment: rare  . Drug use: No    Review of Systems Constitutional: No fever/chills Cardiovascular: Denies chest pain. Respiratory: Denies shortness of breath. Gastrointestinal: No  abdominal pain.   Genitourinary: Foley catheter issues with some purulent drainage from the tip of his penis and blood in the catheter bag.   ____________________________________________   PHYSICAL EXAM:  VITAL SIGNS: ED Triage Vitals  Enc Vitals Group     BP 09/10/19 2223 132/71     Pulse Rate 09/10/19 2223 77     Resp 09/10/19 2223 18     Temp 09/10/19 2223 98.4 F (36.9 C)     Temp Source 09/10/19 2223 Oral     SpO2 09/10/19 2223 95 %     Weight 09/10/19 2227 93.4 kg (205 lb 14.6 oz)     Height 09/10/19 2227 1.727 m (5\' 8" )     Head Circumference --      Peak Flow --      Pain Score 09/10/19 2227 0     Pain Loc --      Pain Edu? --      Excl. in [place]? --     Constitutional: Alert and oriented.  No acute distress. Eyes: Conjunctivae are normal.  Head: Atraumatic. Cardiovascular: Normal rate, regular rhythm.  Respiratory: Normal respiratory effort.  No retractions. Genitourinary: Circumcised male with Foley catheter in place.  Small amount of pus at the urethral meatus.  No drainage currently from around the catheter but there is some evidence in his underwear that he has been leaking some bloody urine.  The urine in the leg bag is light pink consistent with some gross hematuria but there are no clots present.   Musculoskeletal: No lower extremity tenderness nor edema. No gross deformities of extremities.  Ambulatory without difficulty. Neurologic:  Normal speech and language. No gross focal neurologic deficits are appreciated.  Skin:  Skin is warm, dry and intact. Psychiatric: Mood and affect are normal. Speech and behavior are normal.  ____________________________________________   LABS (all labs ordered are listed, but only abnormal results are displayed)  Labs Reviewed  URINE CULTURE  URINALYSIS, COMPLETE (UACMP) WITH MICROSCOPIC   ____________________________________________  EKG  No indication for EKG ____________________________________________  RADIOLOGY  I, 2228, personally viewed and evaluated these images (plain radiographs) as part of my medical decision making, as well as reviewing the written report by the radiologist.  ED MD interpretation: The Foley catheter appears to be in place to the best of the ability to determine on a plain film.  Official radiology report(s): Dg Abdomen 1 View  Result Date: 09/11/2019 CLINICAL DATA:  Evaluate Foley catheter EXAM: ABDOMEN - 1 VIEW COMPARISON:  None. FINDINGS: Due to overlying bowel gas unable to fully visualize Foley catheter balloon. The catheter appears to be projecting over the lower pelvis. The bowel gas pattern is normal. No radio-opaque calculi or other significant radiographic abnormality are seen. IMPRESSION: Foley catheter seen projecting over the lower pelvis. Unable to visualized balloon. Electronically Signed   By: 11/11/2019 M.D.   On: 09/11/2019  01:46    ____________________________________________   PROCEDURES   Procedure(s) performed (including Critical Care):  Procedures   ____________________________________________   INITIAL IMPRESSION / MDM / [place] / ED COURSE  As part of my medical decision making, I reviewed the following data within the [place] notes reviewed and incorporated, Labs reviewed , Discussed with radiologist and Notes from prior ED visits, and old chart reviewed.   The patient is well-appearing and in no acute distress.  He is obviously struggling with Foley catheter but there is no sign of retention at this time and although there is a little bit of purulent discharge from the urethral meatus the urine itself does not appear grossly infected.  The last couple of urinalyses have shown increasing numbers of white cells and given the discharge I think it is reasonable to start him on antibiotics.  Unfortunately he reports anaphylaxis to penicillins and to sulfa medications I am a little bit reluctant even  to try cephalosporins.  His nurse was able to flush and retract and verified that the catheter is in place and functioning appropriately.  I spoke by phone with Dr. Erlene Quan with urology she agreed with the current plan and thought that antibiotics would not hurt him and may be beneficial.  She agreed with my suggestion of Cipro, while it is not first-line anymore for urinary tract infections based on the current antibiogram, it may be the best choice in this patient's case.  She will also reach out to Dr. Diamantina Providence and their office staff to try to get an appointment for him in the urology clinic before 14 October.  I updated the patient and he understands and agrees with the plan for urology clinic follow-up.         ____________________________________________  FINAL CLINICAL IMPRESSION(S) / ED DIAGNOSES  Final diagnoses:  Foley catheter problem, subsequent encounter  Urinary tract infection associated with indwelling urethral catheter, initial encounter [facility])     MEDICATIONS GIVEN DURING THIS VISIT:  Medications  ciprofloxacin (CIPRO) tablet 500 mg (500 mg Oral Given 09/11/19 0252)     ED Discharge Orders         Ordered    ciprofloxacin (CIPRO) 500 MG tablet  2 times daily     09/11/19 0225          *Please note:  DARSHAWN BOATENG was evaluated in Emergency Department on 09/11/2019 for the symptoms described in the history of present illness. He was evaluated in the context of the global COVID-19 pandemic, which necessitated consideration that the patient might be at risk for infection with the SARS-CoV-2 virus that causes COVID-19. Institutional protocols and algorithms that pertain to the evaluation of patients at risk for COVID-19 are in a state of rapid change based on information released by regulatory bodies including the CDC and federal and state organizations. These policies and algorithms were followed during the patient's care in the ED.  Some ED evaluations and  interventions may be delayed as a result of limited staffing during the pandemic.*  Note:  This document was prepared using Dragon voice recognition software and may include unintentional dictation errors.   Hinda Kehr, MD 09/11/19 [phone]

## 2019-09-12 ENCOUNTER — Encounter: Payer: Self-pay | Admitting: Emergency Medicine

## 2019-09-12 ENCOUNTER — Other Ambulatory Visit: Payer: Self-pay

## 2019-09-12 ENCOUNTER — Emergency Department
Admission: EM | Admit: 2019-09-12 | Discharge: 2019-09-12 | Disposition: A | Discharge status: Home / Self Care | Payer: Medicare Other | Attending: Emergency Medicine | Admitting: Emergency Medicine

## 2019-09-12 DIAGNOSIS — Z79899 Other long term (current) drug therapy: Secondary | ICD-10-CM | POA: Diagnosis not present

## 2019-09-12 DIAGNOSIS — R319 Hematuria, unspecified: Secondary | ICD-10-CM | POA: Insufficient documentation

## 2019-09-12 DIAGNOSIS — N390 Urinary tract infection, site not specified: Secondary | ICD-10-CM | POA: Insufficient documentation

## 2019-09-12 DIAGNOSIS — F17210 Nicotine dependence, cigarettes, uncomplicated: Secondary | ICD-10-CM | POA: Insufficient documentation

## 2019-09-12 DIAGNOSIS — R202 Paresthesia of skin: Secondary | ICD-10-CM | POA: Insufficient documentation

## 2019-09-12 DIAGNOSIS — I10 Essential (primary) hypertension: Secondary | ICD-10-CM | POA: Diagnosis not present

## 2019-09-12 LAB — URINE CULTURE
Culture: NO GROWTH
Special Requests: NORMAL

## 2019-09-12 MED ORDER — DOXYCYCLINE MONOHYDRATE 100 MG PO CAPS: 100.0000 mg | ORAL_CAPSULE | Freq: Two times a day (BID) | ORAL | 0 refills | Status: DC

## 2019-09-12 MED ORDER — DOXYCYCLINE HYCLATE 100 MG PO TABS
100.0000 mg | ORAL_TABLET | Freq: Once | ORAL | Status: AC
  Administered 2019-09-12: 100 mg via ORAL
  Filled 2019-09-12: qty 1

## 2019-09-12 NOTE — ED Notes (Signed)
See triage note  Presents requesting a different antibiotic   States he was given Cipro and took 1 pill only  States he did fine  But feels like "something" isn't right  States he feel like his feet and abd are swollen   Foley cath intact and draining well

## 2019-09-12 NOTE — ED Triage Notes (Signed)
Pt reports was given cipro on 1/4 for a UTI and he wants a different antibiotic. Pt reports this one makes him feel funny.

## 2019-09-12 NOTE — ED Provider Notes (Signed)
[facility] Emergency Department Provider Note   ____________________________________________   First MD Initiated Contact with Patient 09/12/19 1011     (approximate)  I have reviewed the triage vital signs and the nursing notes.   HISTORY  Chief Complaint different medication    HPI Roger [name] is a 73 y.o. male patient is a follow-up visit the visit where he was diagnosed and treated for UTI.  Patient was given a prescription for Cipro.  Patient said he had trouble getting the medicine from the pharmacy.  Patient that he took the first doses late in the evening and approximately 3 to 4 hours later he started noticing tingling in his feet.  Patient rated tingling also radiated to the back of his heel tendons.  Patient did not take morning dose with return to the ED requesting a change in medications.  Reviewed the patient's notes do not show a recent urine culture.  Patient adamant he did not want to take Cipro.  Patient has an appointment with his urologist in 2 days.  Patient has an indwelling Foley catheter which was placed 2 weeks ago.      Past Medical History:  Diagnosis Date  . Atrial fibrillation (HCC)   . Hypertension   . Kidney stones     Patient Active Problem List   Diagnosis Date Noted  . Hypotension 09/01/2019  . Ventricular fibrillation (HCC) 04/19/2019    Past Surgical History:  Procedure Laterality Date  . CATARACT EXTRACTION    . CORONARY STENT INTERVENTION N/A 04/20/2019   Procedure: CORONARY STENT INTERVENTION;  Surgeon: Alwyn Peaallwood, Dwayne D, MD;  Location: [facility] INVASIVE CV LAB;  Service: Cardiovascular;  Laterality: N/A;  . HIP SURGERY    . LEFT HEART CATH AND CORONARY ANGIOGRAPHY Left 04/20/2019   Procedure: LEFT HEART CATH AND CORONARY ANGIOGRAPHY;  Surgeon: Laurier NancyKhan, Shaukat A, MD;  Location: [facility] INVASIVE CV LAB;  Service: Cardiovascular;  Laterality: Left;    Prior to Admission medications   Medication Sig Start Date  End Date Taking? Authorizing Provider  amiodarone (PACERONE) 400 MG tablet Take 1 tablet (400 mg total) by mouth daily. 04/21/19   Adrian SaranMody, Sital, MD  apixaban (ELIQUIS) 5 MG TABS tablet Take 1 tablet (5 mg total) by mouth 2 (two) times daily. 04/21/19   Adrian SaranMody, Sital, MD  atorvastatin (LIPITOR) 10 MG tablet Take 10 mg by mouth daily. 05/22/19   [provider]  Cholecalciferol (VITAMIN D3) 1.25 MG (50000 UT) CAPS Take 50,000 Units by mouth once a week. 05/02/19   [provider]  ciprofloxacin (CIPRO) 500 MG tablet Take 1 tablet (500 mg total) by mouth 2 (two) times daily for 10 days. 09/11/19 09/21/19  Loleta RoseForbach, Cory, MD  ciprofloxacin (CIPRO) 500 MG tablet Take 1 tablet (500 mg total) by mouth 2 (two) times daily for 10 days. 09/11/19 09/21/19  Jene EveryKinner, Petra, MD  doxycycline (MONODOX) 100 MG capsule Take 1 capsule (100 mg total) by mouth 2 (two) times daily. 09/12/19   Joni ReiningSmith, Ronald K, PA-C  ezetimibe (ZETIA) 10 MG tablet Take 10 mg by mouth daily. 08/13/19   [provider]  fluticasone (FLONASE) 50 MCG/ACT nasal spray Place 2 sprays into the nose daily. 04/08/19 04/07/20  [provider]  metoprolol tartrate (LOPRESSOR) 25 MG tablet Take 25 mg by mouth 2 (two) times daily.    [provider]  pantoprazole (PROTONIX) 40 MG tablet Take 1 tablet (40 mg total) by mouth daily. 04/21/19   Adrian SaranMody, Sital, MD  spironolactone (ALDACTONE) 25 MG tablet Take 25 mg by mouth daily. 08/13/19   [provider]  tamsulosin (FLOMAX) 0.4 MG CAPS capsule Take 0.4 mg by mouth daily. 08/29/19   [provider]  lisinopril (PRINIVIL,ZESTRIL) 10 MG tablet Take 5 mg by mouth daily.   06/10/19  [provider]    Allergies Amoxicillin, Sulfa antibiotics, Pravastatin, and Rosuvastatin  No family history on file.  Social History Social History   Tobacco Use  . Smoking status: Current Every Day Smoker    Packs/day: 0.50    Types: Cigarettes  . Smokeless tobacco:  Never Used  Substance Use Topics  . Alcohol use: Yes    Comment: rare  . Drug use: No    Review of Systems  Constitutional: No fever/chills Eyes: No visual changes. ENT: No sore throat. Cardiovascular: Denies chest pain.  A. fib Respiratory: Denies shortness of breath. Gastrointestinal: No abdominal pain.  No nausea, no vomiting.  No diarrhea.  No constipation. Genitourinary: Negative for dysuria. Musculoskeletal: Negative for back pain. Skin: Negative for rash. Neurological: Negative for headaches, focal weakness or numbness. Endocrine:  Hypotension. Allergic/Immunilogical: Amoxicillin sulfur antibiotics and statins. ____________________________________________   PHYSICAL EXAM:  VITAL SIGNS: ED Triage Vitals  Enc Vitals Group     BP 09/12/19 1023 127/77     Pulse Rate 09/12/19 1023 80     Resp 09/12/19 1023 18     Temp 09/12/19 1023 98.4 F (36.9 C)     Temp Source 09/12/19 1023 Oral     SpO2 09/12/19 1023 98 %     Weight 09/12/19 1001 207 lb (93.9 kg)     Height 09/12/19 1001 5\' 8"  (1.727 m)     Head Circumference --      Peak Flow --      Pain Score 09/12/19 0958 0     Pain Loc --      Pain Edu? --      Excl. in [place]? --    Constitutional: Alert and oriented. Well appearing and in no acute distress. Neck: No stridor.  No cervical spine tenderness to palpation. Hematological/Lymphatic/Immunilogical: No cervical lymphadenopathy. Cardiovascular: Normal rate, regular rhythm. Grossly normal heart sounds.  Good peripheral circulation. Respiratory: Normal respiratory effort.  No retractions. Lungs CTAB. Gastrointestinal: Soft and nontender. No distention. No abdominal bruits. No CVA tenderness. Genitourinary: Indwelling catheter. Musculoskeletal: No lower extremity tenderness nor edema.  No joint effusions. Neurologic:  Normal speech and language. No gross focal neurologic deficits are appreciated. No gait instability. Skin:  Skin is warm, dry and intact. No rash  noted. Psychiatric: Mood and affect are normal. Speech and behavior are normal.  ____________________________________________   LABS (all labs ordered are listed, but only abnormal results are displayed)  Labs Reviewed - No data to display ____________________________________________  EKG   ____________________________________________  RADIOLOGY  ED MD interpretation:    Official radiology report(s): No results found.  ____________________________________________   PROCEDURES  Procedure(s) performed (including Critical Care):  Procedures   ____________________________________________   INITIAL IMPRESSION / ASSESSMENT AND PLAN / ED COURSE  As part of my medical decision making, I reviewed the following data within the electronic MEDICAL RECORD NUMBER         Patient requests change antibiotics for UTI.  Patient indwelling catheter.  I do not have a urine culture from previous visits.  Discussed with patient alternative antibiotic which he may try pending his urology visit in 2 days.  Patient is amenable to changing antibiotics to doxycycline.  ____________________________________________   FINAL CLINICAL IMPRESSION(S) / ED DIAGNOSES  Final diagnoses:  Urinary tract infection with hematuria, site unspecified     ED Discharge Orders         Ordered    doxycycline (MONODOX) 100 MG capsule  2 times daily     09/12/19 1028           Note:  This document was prepared using Dragon voice recognition software and may include unintentional dictation errors.    Sable Feil, PA-C 09/12/19 1107    Blake Divine, MD 09/12/19 [phone]

## 2019-09-12 NOTE — Discharge Instructions (Signed)
Discontinue Cipro.  Start doxycycline as directed.  Follow-up with scheduled urology appointment this week.

## 2019-09-13 ENCOUNTER — Encounter: Payer: Self-pay | Admitting: *Deleted

## 2019-09-13 ENCOUNTER — Other Ambulatory Visit: Payer: Self-pay

## 2019-09-13 DIAGNOSIS — F17210 Nicotine dependence, cigarettes, uncomplicated: Secondary | ICD-10-CM | POA: Insufficient documentation

## 2019-09-13 DIAGNOSIS — Y732 Prosthetic and other implants, materials and accessory gastroenterology and urology devices associated with adverse incidents: Secondary | ICD-10-CM | POA: Insufficient documentation

## 2019-09-13 DIAGNOSIS — I10 Essential (primary) hypertension: Secondary | ICD-10-CM | POA: Insufficient documentation

## 2019-09-13 DIAGNOSIS — T839XXA Unspecified complication of genitourinary prosthetic device, implant and graft, initial encounter: Secondary | ICD-10-CM | POA: Insufficient documentation

## 2019-09-13 DIAGNOSIS — Z79899 Other long term (current) drug therapy: Secondary | ICD-10-CM | POA: Insufficient documentation

## 2019-09-13 NOTE — ED Triage Notes (Signed)
Pt has foley cath in for 2 weeks.  Pt states its leaking.  Sx began tonight.  Pt reports blood in urine. Pt alert.

## 2019-09-14 ENCOUNTER — Ambulatory Visit: Payer: Self-pay | Admitting: Urology

## 2019-09-14 ENCOUNTER — Ambulatory Visit (INDEPENDENT_AMBULATORY_CARE_PROVIDER_SITE_OTHER): Payer: Medicare Other | Admitting: Urology

## 2019-09-14 ENCOUNTER — Emergency Department
Admission: EM | Admit: 2019-09-14 | Discharge: 2019-09-14 | Disposition: A | Discharge status: Home / Self Care | Payer: Medicare Other | Attending: Emergency Medicine | Admitting: Emergency Medicine

## 2019-09-14 VITALS — BP 138/78 | HR 90 | Ht 65.0 in | Wt 207.0 lb

## 2019-09-14 DIAGNOSIS — N138 Other obstructive and reflux uropathy: Secondary | ICD-10-CM

## 2019-09-14 DIAGNOSIS — R339 Retention of urine, unspecified: Secondary | ICD-10-CM

## 2019-09-14 DIAGNOSIS — N401 Enlarged prostate with lower urinary tract symptoms: Secondary | ICD-10-CM | POA: Diagnosis not present

## 2019-09-14 DIAGNOSIS — N210 Calculus in bladder: Secondary | ICD-10-CM

## 2019-09-14 DIAGNOSIS — T839XXA Unspecified complication of genitourinary prosthetic device, implant and graft, initial encounter: Secondary | ICD-10-CM

## 2019-09-14 LAB — BLADDER SCAN AMB NON-IMAGING: Scan Result: 44

## 2019-09-14 MED ORDER — CIPROFLOXACIN HCL 500 MG PO TABS: 500.0000 mg | ORAL_TABLET | Freq: Once | ORAL | Status: DC

## 2019-09-14 NOTE — Patient Instructions (Signed)

## 2019-09-14 NOTE — Progress Notes (Signed)
Cystoscopy Procedure Note:  Indication: Gross hematuria, retention  After informed consent and discussion of the procedure and its risks, Roger [name] was positioned and prepped in the standard fashion. Cystoscopy was performed with a flexible cystoscope. The urethra, bladder neck and entire bladder was visualized in a standard fashion. The prostate was large with a high bladder neck. The ureteral orifices were visualized in their normal location and orientation.  1 cm bladder stone at the base of the bladder.  There is some erythema throughout the trigone and posterior bladder most likely consistent with Foley irritation, cytology sent.  Imaging: CT abdomen pelvis 09/01/2019 with 70 g prostate, 1 cm bladder stone  Findings: Enlarged prostate, erythematous bladder likely consistent with Foley irritation, but cannot rule out malignancy, 1 cm bladder stone  Assessment and Plan: In summary, he is a 73 year old comorbid male with urinary retention, gross hematuria, and a 1 cm bladder stone secondary to BPH and outlet obstruction.  At baseline, he has very bothersome urinary symptoms of weak stream and straining.  We discussed the risks and benefits of HOLEP at length.  The procedure requires general anesthesia and takes 2 to 3 hours, and a holmium laser is used to enucleate the prostate and push this tissue into the bladder.  A morcellator is then used to remove this tissue, which is sent for pathology.  Majority of patients are able to discharge the same day with a catheter in place for 2 to 3 days, and will follow-up in clinic for a voiding trial.  Approximately 10% of patients will be admitted overnight to monitor the urine, or if they have multiple comorbidities.  We specifically discussed the risks of bleeding, infection, retrograde ejaculation, temporary urgency and urge incontinence, very low risk of long-term incontinence, and possible need for additional procedures.  Schedule  cystolitholapaxy, HOLEP, possible bladder biopsy Okay to continue anticoagulation with his extensive cardiac history  Nickolas Madrid, MD 09/14/2019

## 2019-09-14 NOTE — ED Provider Notes (Signed)
[facility] Emergency Department Provider Note __   First MD Initiated Contact with Patient 09/14/19 0112     (approximate)  I have reviewed the triage vital signs and the nursing notes.   HISTORY  Chief Complaint foley cath leaking    HPI Roger [name] is a 73 y.o. male with below list of previous medical conditions including indwelling Foley catheter presents to the emergency department secondary to hematuria noted around the indwelling catheter.  Patient denies any discomfort no fever.  Patient has an appointment today with [facility] urology.        Past Medical History:  Diagnosis Date  . Atrial fibrillation (HCC)   . Hypertension   . Kidney stones     Patient Active Problem List   Diagnosis Date Noted  . Hypotension 09/01/2019  . Ventricular fibrillation (HCC) 04/19/2019    Past Surgical History:  Procedure Laterality Date  . CATARACT EXTRACTION    . CORONARY STENT INTERVENTION N/A 04/20/2019   Procedure: CORONARY STENT INTERVENTION;  Surgeon: Alwyn Pea, MD;  Location: [facility] INVASIVE CV LAB;  Service: Cardiovascular;  Laterality: N/A;  . HIP SURGERY    . LEFT HEART CATH AND CORONARY ANGIOGRAPHY Left 04/20/2019   Procedure: LEFT HEART CATH AND CORONARY ANGIOGRAPHY;  Surgeon: Laurier Nancy, MD;  Location: [facility] INVASIVE CV LAB;  Service: Cardiovascular;  Laterality: Left;    Prior to Admission medications   Medication Sig Start Date End Date Taking? Authorizing Provider  amiodarone (PACERONE) 400 MG tablet Take 1 tablet (400 mg total) by mouth daily. 04/21/19   Adrian Saran, MD  apixaban (ELIQUIS) 5 MG TABS tablet Take 1 tablet (5 mg total) by mouth 2 (two) times daily. 04/21/19   Adrian Saran, MD  atorvastatin (LIPITOR) 10 MG tablet Take 10 mg by mouth daily. 05/22/19   [provider]  Cholecalciferol (VITAMIN D3) 1.25 MG (50000 UT) CAPS Take 50,000 Units by mouth once a week. 05/02/19   [provider]   ciprofloxacin (CIPRO) 500 MG tablet Take 1 tablet (500 mg total) by mouth 2 (two) times daily for 10 days. 09/11/19 09/21/19  Loleta Rose, MD  ciprofloxacin (CIPRO) 500 MG tablet Take 1 tablet (500 mg total) by mouth 2 (two) times daily for 10 days. 09/11/19 09/21/19  Jene Every, MD  doxycycline (MONODOX) 100 MG capsule Take 1 capsule (100 mg total) by mouth 2 (two) times daily. 09/12/19   Joni Reining, PA-C  ezetimibe (ZETIA) 10 MG tablet Take 10 mg by mouth daily. 08/13/19   [provider]  fluticasone (FLONASE) 50 MCG/ACT nasal spray Place 2 sprays into the nose daily. 04/08/19 04/07/20  [provider]  metoprolol tartrate (LOPRESSOR) 25 MG tablet Take 25 mg by mouth 2 (two) times daily.    [provider]  pantoprazole (PROTONIX) 40 MG tablet Take 1 tablet (40 mg total) by mouth daily. 04/21/19   Adrian Saran, MD  spironolactone (ALDACTONE) 25 MG tablet Take 25 mg by mouth daily. 08/13/19   [provider]  tamsulosin (FLOMAX) 0.4 MG CAPS capsule Take 0.4 mg by mouth daily. 08/29/19   [provider]  lisinopril (PRINIVIL,ZESTRIL) 10 MG tablet Take 5 mg by mouth daily.   06/10/19  [provider]    Allergies Amoxicillin, Sulfa antibiotics, Pravastatin, and Rosuvastatin  No family history on file.  Social History Social History   Tobacco Use  . Smoking status: Current Every Day Smoker    Packs/day: 0.50  Types: Cigarettes  . Smokeless tobacco: Never Used  Substance Use Topics  . Alcohol use: Not Currently    Comment: rare  . Drug use: No    Review of Systems Constitutional: No fever/chills Eyes: No visual changes. ENT: No sore throat. Cardiovascular: Denies chest pain. Respiratory: Denies shortness of breath. Gastrointestinal: No abdominal pain.  No nausea, no vomiting.  No diarrhea.  No constipation. Genitourinary: Negative for dysuria.  Positive for hematuria.  Positive for urine leaking around Foley catheter.    Musculoskeletal: Negative for neck pain.  Negative for back pain. Integumentary: Negative for rash. Neurological: Negative for headaches, focal weakness or numbness.   ____________________________________________   PHYSICAL EXAM:  VITAL SIGNS: ED Triage Vitals  Enc Vitals Group     BP 09/13/19 2124 113/62     Pulse Rate 09/13/19 2124 77     Resp 09/13/19 2124 18     Temp 09/13/19 2124 98.5 F (36.9 C)     Temp Source 09/13/19 2124 Oral     SpO2 09/13/19 2124 96 %     Weight --      Height --      Head Circumference --      Peak Flow --      Pain Score 09/13/19 2129 0     Pain Loc --      Pain Edu? --      Excl. in [place]? --     Constitutional: Alert and oriented.  Eyes: Conjunctivae are normal.  Mouth/Throat: Mucous membranes are moist. Neck: No stridor.  No meningeal signs.   Cardiovascular: Normal rate, regular rhythm. Good peripheral circulation. Grossly normal heart sounds. Respiratory: Normal respiratory effort.  No retractions. Gastrointestinal: Soft and nontender. No distention.  Genitourinary: Indwelling Foley catheter in place with no active urine leaking around the Foley.  Hematuria (daughter) noted in the Foley catheter bag. Musculoskeletal: No lower extremity tenderness nor edema. No gross deformities of extremities. Neurologic:  Normal speech and language. No gross focal neurologic deficits are appreciated.  Skin:  Skin is warm, dry and intact. Psychiatric: Mood and affect are normal. Speech and behavior are normal.      Procedures   ____________________________________________   INITIAL IMPRESSION / MDM / ASSESSMENT AND PLAN / ED COURSE  As part of my medical decision making, I reviewed the following data within the electronic MEDICAL RECORD NUMBER  73 year old male presented with above-stated history and physical exam secondary to urine leaking around indwelling Foley catheter.  No active leakage of urine around the Foley catheter at present.  Patient  has an appointment with urology today.  Therefore we will forego changing the patient's Foley catheter at this time.  ____________________________________________  FINAL CLINICAL IMPRESSION(S) / ED DIAGNOSES  Final diagnoses:  Foley catheter problem, initial encounter [name])     MEDICATIONS GIVEN DURING THIS VISIT:  Medications - No data to display   ED Discharge Orders    None      *Please note:  JESSIE SCHRIEBER was evaluated in Emergency Department on 09/14/2019 for the symptoms described in the history of present illness. He was evaluated in the context of the global COVID-19 pandemic, which necessitated consideration that the patient might be at risk for infection with the SARS-CoV-2 virus that causes COVID-19. Institutional protocols and algorithms that pertain to the evaluation of patients at risk for COVID-19 are in a state of rapid change based on information released by regulatory bodies including the CDC and federal and state organizations. These policies  and algorithms were followed during the patient's care in the ED.  Some ED evaluations and interventions may be delayed as a result of limited staffing during the pandemic.*  Note:  This document was prepared using Dragon voice recognition software and may include unintentional dictation errors.   Darci CurrentBrown, [place] N, MD 09/14/19 [phone]

## 2019-09-14 NOTE — ED Notes (Signed)
See paper chart for dispo vitals and signature

## 2019-09-15 ENCOUNTER — Telehealth: Payer: Self-pay | Admitting: Urology

## 2019-09-15 ENCOUNTER — Ambulatory Visit (INDEPENDENT_AMBULATORY_CARE_PROVIDER_SITE_OTHER): Payer: Medicare Other | Admitting: *Deleted

## 2019-09-15 ENCOUNTER — Other Ambulatory Visit: Payer: Self-pay

## 2019-09-15 DIAGNOSIS — R339 Retention of urine, unspecified: Secondary | ICD-10-CM

## 2019-09-15 LAB — BLADDER SCAN AMB NON-IMAGING: Scan Result: 458

## 2019-09-15 NOTE — Telephone Encounter (Signed)
Matt from [facility] called to let us know you couldn't run the hematuria profile due to the specimen type, so they ran cytology.

## 2019-09-15 NOTE — Telephone Encounter (Signed)
This is appropriate noted thanks

## 2019-09-15 NOTE — Progress Notes (Addendum)
Simple Catheter Placement  Due to urinary retention patient is present today for a foley cath placement.  Patient was cleaned and prepped in a sterile fashion with betadine and lidocaine jelly 2% was instilled into the urethra.  A 18 coude FR foley catheter was inserted, urine return was noted  470ml, urine was dark tea  in color.  The balloon was filled with 10cc of sterile water.  A night bag was attached for drainage. Patient was given instruction on proper catheter care.  Patient tolerated well, no complications were noted   Performed by: Gaspar Cola cma   Additional notes/ Follow up: Patient to be scheduled for surgery . Have cath until surgery

## 2019-09-18 ENCOUNTER — Other Ambulatory Visit: Payer: Self-pay | Admitting: Radiology

## 2019-09-18 DIAGNOSIS — R339 Retention of urine, unspecified: Secondary | ICD-10-CM

## 2019-09-18 DIAGNOSIS — N138 Other obstructive and reflux uropathy: Secondary | ICD-10-CM

## 2019-09-18 DIAGNOSIS — N210 Calculus in bladder: Secondary | ICD-10-CM

## 2019-09-20 ENCOUNTER — Other Ambulatory Visit: Payer: Medicare Other | Admitting: Urology

## 2019-09-21 ENCOUNTER — Emergency Department
Admission: EM | Admit: 2019-09-21 | Discharge: 2019-09-21 | Disposition: A | Discharge status: Home / Self Care | Payer: Medicare Other | Attending: Emergency Medicine | Admitting: Emergency Medicine

## 2019-09-21 ENCOUNTER — Other Ambulatory Visit: Payer: Self-pay

## 2019-09-21 DIAGNOSIS — I10 Essential (primary) hypertension: Secondary | ICD-10-CM | POA: Insufficient documentation

## 2019-09-21 DIAGNOSIS — Z955 Presence of coronary angioplasty implant and graft: Secondary | ICD-10-CM | POA: Insufficient documentation

## 2019-09-21 DIAGNOSIS — R310 Gross hematuria: Secondary | ICD-10-CM | POA: Insufficient documentation

## 2019-09-21 DIAGNOSIS — Z79899 Other long term (current) drug therapy: Secondary | ICD-10-CM | POA: Insufficient documentation

## 2019-09-21 DIAGNOSIS — F17210 Nicotine dependence, cigarettes, uncomplicated: Secondary | ICD-10-CM | POA: Diagnosis not present

## 2019-09-21 DIAGNOSIS — R42 Dizziness and giddiness: Secondary | ICD-10-CM

## 2019-09-21 DIAGNOSIS — Z7901 Long term (current) use of anticoagulants: Secondary | ICD-10-CM | POA: Insufficient documentation

## 2019-09-21 LAB — CBC WITH DIFFERENTIAL/PLATELET
Abs Immature Granulocytes: 0.05 10*3/uL (ref 0.00–0.07)
Basophils Absolute: 0.1 10*3/uL (ref 0.0–0.1)
Basophils Relative: 1 %
Eosinophils Absolute: 0.1 10*3/uL (ref 0.0–0.5)
Eosinophils Relative: 1 %
HCT: 36 % — ABNORMAL LOW (ref 39.0–52.0)
Hemoglobin: 11.3 g/dL — ABNORMAL LOW (ref 13.0–17.0)
Immature Granulocytes: 1 %
Lymphocytes Relative: 10 %
Lymphs Abs: 0.9 10*3/uL (ref 0.7–4.0)
MCH: 29.9 pg (ref 26.0–34.0)
MCHC: 31.4 g/dL (ref 30.0–36.0)
MCV: 95.2 fL (ref 80.0–100.0)
Monocytes Absolute: 0.6 10*3/uL (ref 0.1–1.0)
Monocytes Relative: 6 %
Neutro Abs: 7.6 10*3/uL (ref 1.7–7.7)
Neutrophils Relative %: 81 %
Platelets: 227 10*3/uL (ref 150–400)
RBC: 3.78 MIL/uL — ABNORMAL LOW (ref 4.22–5.81)
RDW: 15.3 % (ref 11.5–15.5)
WBC: 9.4 10*3/uL (ref 4.0–10.5)
nRBC: 0 % (ref 0.0–0.2)

## 2019-09-21 LAB — BASIC METABOLIC PANEL
Anion gap: 11 (ref 5–15)
BUN: 22 mg/dL (ref 8–23)
CO2: 29 mmol/L (ref 22–32)
Calcium: 8.7 mg/dL — ABNORMAL LOW (ref 8.9–10.3)
Chloride: 100 mmol/L (ref 98–111)
Creatinine, Ser: 0.98 mg/dL (ref 0.61–1.24)
GFR calc Af Amer: >60 mL/min (ref >60)
GFR calc non Af Amer: >60 mL/min (ref >60)
Glucose, Bld: 96 mg/dL (ref 70–99)
Potassium: 3.8 mmol/L (ref 3.5–5.1)
Sodium: 140 mmol/L (ref 135–145)

## 2019-09-21 LAB — URINALYSIS, ROUTINE W REFLEX MICROSCOPIC
Bacteria, UA: NONE SEEN
Bilirubin Urine: NEGATIVE
Glucose, UA: NEGATIVE mg/dL
Ketones, ur: NEGATIVE mg/dL
Nitrite: NEGATIVE
Protein, ur: NEGATIVE mg/dL
RBC / HPF: >50 RBC/hpf — ABNORMAL HIGH (ref 0–5)
Specific Gravity, Urine: 1.006 (ref 1.005–1.030)
pH: 7 (ref 5.0–8.0)

## 2019-09-21 NOTE — ED Provider Notes (Signed)
[facility] Emergency Department Provider Note   ____________________________________________   First MD Initiated Contact with Patient 09/21/19 [phone]     (approximate)  I have reviewed the triage vital signs and the nursing notes.   HISTORY  Chief Complaint Weakness    HPI Roger [name] is a 73 y.o. male with past medical history of atrial fibrillation on Eliquis, hypertension, and bladder stone presents to the ED complaining of lightheadedness and hematuria.  Patient reports he has been dealing with hematuria off and on for least the past month.  He did require recent Foley catheter placement for urinary retention and was seen last week by urology with replacement of Foley catheter.  He states he has been doing well since then, with urine flowing normally, but noted slight increase in amount of hematuria overnight.  He also had a brief episode of lightheadedness, that has since resolved.  He denies any associated chest pain or shortness of breath, did not feel like he was going to pass out.  He has not had any fevers, chills, nausea, vomiting, flank pain, or abdominal pain.  He is scheduled for a visit with urology next week and eventual procedure to address his bleeding at the end of this month.        Past Medical History:  Diagnosis Date  . Atrial fibrillation ([place])   . Hypertension   . Kidney stones     Patient Active Problem List   Diagnosis Date Noted  . Hypotension 09/01/2019  . Ventricular fibrillation ([place]) 04/19/2019    Past Surgical History:  Procedure Laterality Date  . CATARACT EXTRACTION    . CORONARY STENT INTERVENTION N/A 04/20/2019   Procedure: CORONARY STENT INTERVENTION;  Surgeon: Yolonda Kida, MD;  Location: [place] [facility];  Service: Cardiovascular;  Laterality: N/A;  . HIP SURGERY    . LEFT HEART CATH AND CORONARY ANGIOGRAPHY Left 04/20/2019   Procedure: LEFT HEART CATH AND CORONARY ANGIOGRAPHY;  Surgeon: Dionisio David, MD;  Location: [place] [facility];  Service: Cardiovascular;  Laterality: Left;    Prior to Admission medications   Medication Sig Start Date End Date Taking? Authorizing Provider  amiodarone (PACERONE) 400 MG tablet Take 1 tablet (400 mg total) by mouth daily. 04/21/19   Bettey Costa, MD  apixaban (ELIQUIS) 5 MG TABS tablet Take 1 tablet (5 mg total) by mouth 2 (two) times daily. 04/21/19   Bettey Costa, MD  atorvastatin (LIPITOR) 10 MG tablet Take 10 mg by mouth daily. 05/22/19   [provider]  Cholecalciferol (VITAMIN D3) 1.25 MG (50000 UT) CAPS Take 50,000 Units by mouth once a week. 05/02/19   [provider]  ciprofloxacin (CIPRO) 500 MG tablet Take 1 tablet (500 mg total) by mouth 2 (two) times daily for 10 days. 09/11/19 09/21/19  Hinda Kehr, MD  ciprofloxacin (CIPRO) 500 MG tablet Take 1 tablet (500 mg total) by mouth 2 (two) times daily for 10 days. 09/11/19 09/21/19  Lavonia Drafts, MD  doxycycline (MONODOX) 100 MG capsule Take 1 capsule (100 mg total) by mouth 2 (two) times daily. 09/12/19   Sable Feil, PA-C  ezetimibe (ZETIA) 10 MG tablet Take 10 mg by mouth daily. 08/13/19   [provider]  fluticasone (FLONASE) 50 MCG/ACT nasal spray Place 2 sprays into the nose daily. 04/08/19 04/07/20  [provider]  metoprolol tartrate (LOPRESSOR) 25 MG tablet Take 25 mg by mouth 2 (two) times daily.    [provider]  pantoprazole (PROTONIX) 40 MG tablet Take 1 tablet (40 mg total) by mouth daily. 04/21/19   Adrian SaranMody, Sital, MD  spironolactone (ALDACTONE) 25 MG tablet Take 25 mg by mouth daily. 08/13/19   [provider]  tamsulosin (FLOMAX) 0.4 MG CAPS capsule Take 0.4 mg by mouth daily. 08/29/19   [provider]  lisinopril (PRINIVIL,ZESTRIL) 10 MG tablet Take 5 mg by mouth daily.   06/10/19  [provider]    Allergies Amoxicillin, Sulfa antibiotics, Pravastatin, and Rosuvastatin  No family history on file.   Social History Social History   Tobacco Use  . Smoking status: Current Every Day Smoker    Packs/day: 0.50    Types: Cigarettes  . Smokeless tobacco: Never Used  Substance Use Topics  . Alcohol use: Not Currently    Comment: rare  . Drug use: No    Review of Systems  Constitutional: No fever/chills.  Positive for lightheadedness. Eyes: No visual changes. ENT: No sore throat. Cardiovascular: Denies chest pain. Respiratory: Denies shortness of breath. Gastrointestinal: No abdominal pain.  No nausea, no vomiting.  No diarrhea.  No constipation. Genitourinary: Negative for dysuria.  Positive for hematuria. Musculoskeletal: Negative for back pain. Skin: Negative for rash. Neurological: Negative for headaches, focal weakness or numbness.  ____________________________________________   PHYSICAL EXAM:  VITAL SIGNS: ED Triage Vitals [09/21/19 0651]  Enc Vitals Group     BP      Pulse Rate 78     Resp 16     Temp 98.1 F (36.7 C)     Temp src      SpO2 97 %     Weight 206 lb 12.7 oz (93.8 kg)     Height 5\' 5"  (1.651 m)     Head Circumference      Peak Flow      Pain Score 0     Pain Loc      Pain Edu?      Excl. in GC?     Constitutional: Alert and oriented. Eyes: Conjunctivae are normal. Head: Atraumatic. Nose: No congestion/rhinnorhea. Mouth/Throat: Mucous membranes are moist. Neck: Normal ROM Cardiovascular: Normal rate, regular rhythm. Grossly normal heart sounds. Respiratory: Normal respiratory effort.  No retractions. Lungs CTAB. Gastrointestinal: Soft and nontender. No distention. Genitourinary: Foley catheter in place draining pink urine.  No CVA tenderness bilaterally. Musculoskeletal: No lower extremity tenderness nor edema. Neurologic:  Normal speech and language. No gross focal neurologic deficits are appreciated. Skin:  Skin is warm, dry and intact. No rash noted. Psychiatric: Mood and affect are normal. Speech and behavior are normal.   ____________________________________________   LABS (all labs ordered are listed, but only abnormal results are displayed)  Labs Reviewed  CBC WITH DIFFERENTIAL/PLATELET - Abnormal; Notable for the following components:      Result Value   RBC 3.78 (*)    Hemoglobin 11.3 (*)    HCT 36.0 (*)    All other components within normal limits  URINALYSIS, ROUTINE W REFLEX MICROSCOPIC - Abnormal; Notable for the following components:   Color, Urine STRAW (*)    APPearance CLEAR (*)    Hgb urine dipstick LARGE (*)    Leukocytes,Ua TRACE (*)    RBC / HPF >50 (*)    All other components within normal limits  BASIC METABOLIC PANEL - Abnormal; Notable for the following components:   Calcium 8.7 (*)    All other components within normal limits   ____________________________________________  EKG  ED ECG REPORT I,  Chesley Noon, the attending physician, personally viewed and interpreted this ECG.   Date: 09/21/2019  EKG Time: 6:53  Rate: 73  Rhythm: atrial fibrillation, rate 73  Axis: Normal  Intervals:left bundle branch block  ST&T Change: None    PROCEDURES  Procedure(s) performed (including Critical Care):  Procedures   ____________________________________________   INITIAL IMPRESSION / ASSESSMENT AND PLAN / ED COURSE       73 year old male with history of atrial fibrillation on Eliquis presents to the ED with slight increase in his usual hematuria overnight along with an episode of lightheadedness.  He now states he feels well, but wanted to "make sure I am making more blood that I am putting out".  His H&H is stable and hematuria is an ongoing process for him, set to be addressed by urology in the next 1 to 2 weeks.  Given his cardiac history, decision has been made to maintain anticoagulation in this patient.  Given urine is flowing appropriately, no acute intervention needed for his hematuria.  Remainder of work-up is unremarkable, EKG shows atrial fibrillation with  controlled rate and no ischemic changes.  Will screen UA and BMP, however low suspicion for UTI as he has no infectious symptoms.  If work-up unremarkable, he would be appropriate for outpatient follow-up with urology.  UA with no convincing evidence of infection, especially given his lack of urinary symptoms.  BMP is unremarkable.  Patient is appropriate for discharge home and follow-up with urology, counseled to return to the ED for new or worsening symptoms.  Patient agrees with plan.      ____________________________________________   FINAL CLINICAL IMPRESSION(S) / ED DIAGNOSES  Final diagnoses:  Gross hematuria  Lightheadedness     ED Discharge Orders    None       Note:  This document was prepared using Dragon voice recognition software and may include unintentional dictation errors.   Chesley Noon, MD 09/21/19 [phone]

## 2019-09-21 NOTE — ED Triage Notes (Signed)
Pt to ED via EMS c/o weakness. Pt states he noticed blood in his urine draining from catheter placed last Friday by urologist. Pt has had catheter x3weeks. Pt states blood in urine x3days, weakness new today. Pt denies any pain. Is afib on monitor, controlled rate of 75. Pt takes blood thinner.

## 2019-09-25 ENCOUNTER — Other Ambulatory Visit: Payer: Self-pay | Admitting: Urology

## 2019-09-26 ENCOUNTER — Emergency Department
Admission: EM | Admit: 2019-09-26 | Discharge: 2019-09-26 | Disposition: A | Discharge status: Home / Self Care | Payer: Medicare Other | Attending: Emergency Medicine | Admitting: Emergency Medicine

## 2019-09-26 ENCOUNTER — Other Ambulatory Visit: Payer: Self-pay

## 2019-09-26 ENCOUNTER — Emergency Department
Admission: EM | Admit: 2019-09-26 | Discharge: 2019-09-26 | Disposition: A | Discharge status: Home / Self Care | Payer: Medicare Other | Source: Home / Self Care | Attending: Emergency Medicine | Admitting: Emergency Medicine

## 2019-09-26 ENCOUNTER — Encounter: Payer: Self-pay | Admitting: Emergency Medicine

## 2019-09-26 ENCOUNTER — Encounter: Payer: Self-pay | Admitting: *Deleted

## 2019-09-26 DIAGNOSIS — Z7901 Long term (current) use of anticoagulants: Secondary | ICD-10-CM | POA: Insufficient documentation

## 2019-09-26 DIAGNOSIS — Z466 Encounter for fitting and adjustment of urinary device: Secondary | ICD-10-CM

## 2019-09-26 DIAGNOSIS — Y69 Unspecified misadventure during surgical and medical care: Secondary | ICD-10-CM | POA: Diagnosis not present

## 2019-09-26 DIAGNOSIS — I10 Essential (primary) hypertension: Secondary | ICD-10-CM | POA: Insufficient documentation

## 2019-09-26 DIAGNOSIS — I4891 Unspecified atrial fibrillation: Secondary | ICD-10-CM | POA: Insufficient documentation

## 2019-09-26 DIAGNOSIS — T839XXA Unspecified complication of genitourinary prosthetic device, implant and graft, initial encounter: Secondary | ICD-10-CM | POA: Diagnosis present

## 2019-09-26 DIAGNOSIS — F17210 Nicotine dependence, cigarettes, uncomplicated: Secondary | ICD-10-CM | POA: Insufficient documentation

## 2019-09-26 DIAGNOSIS — Z79899 Other long term (current) drug therapy: Secondary | ICD-10-CM | POA: Insufficient documentation

## 2019-09-26 NOTE — ED Triage Notes (Signed)
Pt states he was cleaning his urinary cath and noted leaking urin at insertion site. Has a urology appointment on Thursday. He believes that they are going to remove the catheter then

## 2019-09-26 NOTE — ED Triage Notes (Signed)
PT seen here earlier today for foley leaking.  Pt states it was draining better when he left and when he arrived home, it began to leak again.

## 2019-09-26 NOTE — ED Notes (Signed)
Pt returns with leaking catheter. #18 F in place. Called for urology cart for correct size

## 2019-09-26 NOTE — Discharge Instructions (Signed)
Follow instructions on cleaning yourself and the catheter. If you have any other issues, please call urology or return to the ER.

## 2019-09-26 NOTE — ED Provider Notes (Signed)
[facility] Emergency Department Provider Note  ____________________________________________  Time seen: Approximately 9:35 PM  I have reviewed the triage vital signs and the nursing notes.   HISTORY  Chief Complaint Dysuria   HPI Roger [name] is a 73 y.o. male that was just discharged from the emergency department a couple hours ago.  His Foley catheter was leaking and found not to have enough fluid in the balloon.  The balloon was reinflated and urine was flowing through the catheter without issue.  He states that when he got home he felt that it was leaking again.  Past Medical History:  Diagnosis Date  . Atrial fibrillation ([place])   . Hypertension   . Kidney stones     Patient Active Problem List   Diagnosis Date Noted  . Hypotension 09/01/2019  . Ventricular fibrillation ([place]) 04/19/2019    Past Surgical History:  Procedure Laterality Date  . CATARACT EXTRACTION    . CORONARY STENT INTERVENTION N/A 04/20/2019   Procedure: CORONARY STENT INTERVENTION;  Surgeon: Yolonda Kida, MD;  Location: [place] [facility];  Service: Cardiovascular;  Laterality: N/A;  . HIP SURGERY    . LEFT HEART CATH AND CORONARY ANGIOGRAPHY Left 04/20/2019   Procedure: LEFT HEART CATH AND CORONARY ANGIOGRAPHY;  Surgeon: Dionisio David, MD;  Location: [place] [facility];  Service: Cardiovascular;  Laterality: Left;    Prior to Admission medications   Medication Sig Start Date End Date Taking? Authorizing Provider  amiodarone (PACERONE) 400 MG tablet Take 1 tablet (400 mg total) by mouth daily. Patient not taking: Reported on 09/26/2019 04/21/19   Bettey Costa, MD  apixaban (ELIQUIS) 2.5 MG TABS tablet Take 2.5 mg by mouth 2 (two) times daily.    [provider]  apixaban (ELIQUIS) 5 MG TABS tablet Take 1 tablet (5 mg total) by mouth 2 (two) times daily. Patient not taking: Reported on 09/26/2019 04/21/19   Bettey Costa, MD  atorvastatin (LIPITOR) 10 MG  tablet Take 10 mg by mouth daily. 05/22/19   [provider]  Cholecalciferol (VITAMIN D3) 1.25 MG (50000 UT) CAPS Take 50,000 Units by mouth every Thursday.  05/02/19   [provider]  doxycycline (MONODOX) 100 MG capsule Take 1 capsule (100 mg total) by mouth 2 (two) times daily. Patient not taking: Reported on 09/26/2019 09/12/19   Sable Feil, PA-C  ezetimibe (ZETIA) 10 MG tablet Take 10 mg by mouth daily. 08/13/19   [provider]  fluticasone (FLONASE) 50 MCG/ACT nasal spray Place 2 sprays into the nose daily as needed for allergies.  04/08/19 04/07/20  [provider]  furosemide (LASIX) 20 MG tablet Take 20 mg by mouth daily. 09/12/19   [provider]  ketotifen (ZADITOR) 0.025 % ophthalmic solution Place 1 drop into both eyes daily as needed (allergies).    [provider]  metoprolol tartrate (LOPRESSOR) 25 MG tablet Take 25 mg by mouth 2 (two) times daily.    [provider]  pantoprazole (PROTONIX) 40 MG tablet Take 1 tablet (40 mg total) by mouth daily. Patient taking differently: Take 40 mg by mouth daily as needed (acid reflux).  04/21/19   Bettey Costa, MD  spironolactone (ALDACTONE) 25 MG tablet Take 25 mg by mouth daily. 08/13/19   [provider]  lisinopril (PRINIVIL,ZESTRIL) 10 MG tablet Take 5 mg by mouth daily.   06/10/19  [provider]    Allergies Amoxicillin, Sulfa antibiotics, Pravastatin, and Rosuvastatin  History reviewed. No  pertinent family history.  Social History Social History   Tobacco Use  . Smoking status: Current Every Day Smoker    Packs/day: 0.50    Types: Cigarettes  . Smokeless tobacco: Never Used  Substance Use Topics  . Alcohol use: Not Currently    Comment: rare  . Drug use: No    Review of Systems Constitutional: Negative for fever. ENT: Negative for sore throat. Respiratory: Negative for cough or shortness of breath Gastrointestinal: No abdominal pain.  No  nausea, no vomiting.  No diarrhea.  Genitourinary: Positive for Foley catheter Musculoskeletal: Negative for generalized body aches. Skin: Negative for rash/lesion/wound. Neurological: Negative for headaches, focal weakness or numbness.  ____________________________________________   PHYSICAL EXAM:  VITAL SIGNS: ED Triage Vitals [09/26/19 2120]  Enc Vitals Group     BP (!) 118/58     Pulse Rate 88     Resp 18     Temp 98.7 F (37.1 C)     Temp Source Oral     SpO2 95 %     Weight 207 lb 3.7 oz (94 kg)     Height 5\' 8"  (1.727 m)     Head Circumference      Peak Flow      Pain Score 0     Pain Loc      Pain Edu?      Excl. in GC?     Constitutional: Alert and oriented. Well appearing and in no acute distress. Eyes: Conjunctivae are normal. PERRL. EOMI. Head: Atraumatic. Nose: No congestion/rhinnorhea. Mouth/Throat: Mucous membranes are moist. Neck: No stridor.  Cardiovascular: Normal rate, regular rhythm. Good peripheral circulation. Respiratory: Normal respiratory effort. Musculoskeletal: Full ROM throughout.  Genitourinary: No evidence of catheter leaking.  No bladder distention. Neurologic:  Normal speech and language. No gross focal neurologic deficits are appreciated. Speech is normal. No gait instability. Skin:  Skin is warm, dry and intact. No rash noted. Psychiatric: Mood and affect are normal. Speech and behavior are normal.  ____________________________________________   LABS (all labs ordered are listed, but only abnormal results are displayed)  Labs Reviewed - No data to display ____________________________________________  EKG  Not indicated ____________________________________________  RADIOLOGY  Not indicated ____________________________________________   PROCEDURES  Foley catheter replaced by , RN ____________________________________________   INITIAL IMPRESSION / ASSESSMENT AND PLAN / ED COURSE     Pertinent labs &  imaging results that were available during my care of the patient were reviewed by me and considered in my medical decision making (see chart for details).  Patient was advised to follow up with urology. ____________________________________________   FINAL CLINICAL IMPRESSION(S) / ED DIAGNOSES  Final diagnoses:  Encounter for Foley catheter replacement       Victorino Dike, FNP 09/26/19 2251    09/28/19, MD 09/26/19 2329

## 2019-09-26 NOTE — ED Notes (Signed)
Added 3 cc fluid to balloon. Only approx 8 cc in balloon on exam by

## 2019-09-26 NOTE — ED Provider Notes (Signed)
[facility] Emergency Department Provider Note  ____________________________________________  Time seen: Approximately 7:54 PM  I have reviewed the triage vital signs and the nursing notes.   HISTORY  Chief Complaint No chief complaint on file.   HPI Roger [name] is a 73 y.o. male who presents to the emergency department for evaluation of his urinary catheter.  He has noticed urine leaking around it for the past couple hours.  He does not feel that his bladder is extended and states that the catheter has been draining and urine has been leaking from around the catheter.  He denies pain or other symptoms of concern.  Past Medical History:  Diagnosis Date  . Atrial fibrillation ([facility])   . Hypertension   . Kidney stones     Patient Active Problem List   Diagnosis Date Noted  . Hypotension 09/01/2019  . Ventricular fibrillation ([facility]) 04/19/2019    Past Surgical History:  Procedure Laterality Date  . CATARACT EXTRACTION    . CORONARY STENT INTERVENTION N/A 04/20/2019   Procedure: CORONARY STENT INTERVENTION;  Surgeon: Alwyn Pea, MD;  Location: [facility];  Service: Cardiovascular;  Laterality: N/A;  . HIP SURGERY    . LEFT HEART CATH AND CORONARY ANGIOGRAPHY Left 04/20/2019   Procedure: LEFT HEART CATH AND CORONARY ANGIOGRAPHY;  Surgeon: Laurier Nancy, MD;  Location: [facility];  Service: Cardiovascular;  Laterality: Left;    Prior to Admission medications   Medication Sig Start Date End Date Taking? Authorizing Provider  amiodarone (PACERONE) 400 MG tablet Take 1 tablet (400 mg total) by mouth daily. Patient not taking: Reported on 09/26/2019 04/21/19   Adrian Saran, MD  apixaban (ELIQUIS) 2.5 MG TABS tablet Take 2.5 mg by mouth 2 (two) times daily.    [provider]  apixaban (ELIQUIS) 5 MG TABS tablet Take 1 tablet (5 mg total) by mouth 2 (two) times daily. Patient not taking: Reported on 09/26/2019 04/21/19    Adrian Saran, MD  atorvastatin (LIPITOR) 10 MG tablet Take 10 mg by mouth daily. 05/22/19   [provider]  Cholecalciferol (VITAMIN D3) 1.25 MG (50000 UT) CAPS Take 50,000 Units by mouth every Thursday.  05/02/19   [provider]  doxycycline (MONODOX) 100 MG capsule Take 1 capsule (100 mg total) by mouth 2 (two) times daily. Patient not taking: Reported on 09/26/2019 09/12/19   Joni Reining, PA-C  ezetimibe (ZETIA) 10 MG tablet Take 10 mg by mouth daily. 08/13/19   [provider]  fluticasone (FLONASE) 50 MCG/ACT nasal spray Place 2 sprays into the nose daily as needed for allergies.  04/08/19 04/07/20  [provider]  furosemide (LASIX) 20 MG tablet Take 20 mg by mouth daily. 09/12/19   [provider]  ketotifen (ZADITOR) 0.025 % ophthalmic solution Place 1 drop into both eyes daily as needed (allergies).    [provider]  metoprolol tartrate (LOPRESSOR) 25 MG tablet Take 25 mg by mouth 2 (two) times daily.    [provider]  pantoprazole (PROTONIX) 40 MG tablet Take 1 tablet (40 mg total) by mouth daily. Patient taking differently: Take 40 mg by mouth daily as needed (acid reflux).  04/21/19   Adrian Saran, MD  spironolactone (ALDACTONE) 25 MG tablet Take 25 mg by mouth daily. 08/13/19   [provider]  lisinopril (PRINIVIL,ZESTRIL) 10 MG tablet Take 5 mg by mouth daily.   06/10/19  [provider]    Allergies Amoxicillin, Sulfa  antibiotics, Pravastatin, and Rosuvastatin  No family history on file.  Social History Social History   Tobacco Use  . Smoking status: Current Every Day Smoker    Packs/day: 0.50    Types: Cigarettes  . Smokeless tobacco: Never Used  Substance Use Topics  . Alcohol use: Not Currently    Comment: rare  . Drug use: No    Review of Systems Constitutional: Negative for fever. ENT: Negative for sore throat. Respiratory: Negative for cough or shortness of  breath. Gastrointestinal: No abdominal pain.  No nausea, no vomiting.  No diarrhea.  Genitourinary: Negative for suprapubic pain. Positive for urine leaking around foley. Musculoskeletal: Negative for generalized body aches. Skin: Negative for rash/lesion/wound. Neurological: Negative for headaches, focal weakness or numbness.  ____________________________________________   PHYSICAL EXAM:  VITAL SIGNS: ED Triage Vitals  Enc Vitals Group     BP 09/26/19 1941 (!) 144/74     Pulse Rate 09/26/19 1941 76     Resp 09/26/19 1941 14     Temp 09/26/19 1941 97.8 F (36.6 C)     Temp src --      SpO2 09/26/19 1941 98 %     Weight 09/26/19 1942 207 lb 3.7 oz (94 kg)     Height 09/26/19 1942 5\' 8"  (1.727 m)     Head Circumference --      Peak Flow --      Pain Score 09/26/19 1942 0     Pain Loc --      Pain Edu? --      Excl. in [place]? --     Constitutional: Alert and oriented. Well appearing and in no acute distress. Eyes: Conjunctivae are normal. PERRL. EOMI. Head: Atraumatic. Nose: No congestion/rhinnorhea. Mouth/Throat: Mucous membranes are moist. Neck: No stridor.  Cardiovascular: Normal rate, regular rhythm. Good peripheral circulation. Respiratory: Normal respiratory effort. Genitourinary: Circumcised, no lesions, erythema, or swelling Musculoskeletal: Full ROM throughout.  Neurologic:  Normal speech and language. No gross focal neurologic deficits are appreciated. Speech is normal. No gait instability. Skin:  Skin is warm, dry and intact. No rash noted. Psychiatric: Mood and affect are normal. Speech and behavior are normal.  ____________________________________________   LABS (all labs ordered are listed, but only abnormal results are displayed)  Labs Reviewed - No data to display ____________________________________________  EKG  Not indicated ____________________________________________  RADIOLOGY  Not  indicated ____________________________________________   PROCEDURES  Catheter balloon was checked.  Catheter calls for 10 cc of fluid but only 8 cc returned on aspiration ____________________________________________   INITIAL IMPRESSION / ASSESSMENT AND PLAN / ED COURSE   73 year old male presenting to the emergency department due to leaking around the Foley catheter.  2 cc of fluid was added to the balloon.  Urine is flowing through the tubing.  Patient denies any pain or feeling of bladder fullness/urinary retention.  He was advised to keep his scheduled appointment with urology or call them for symptoms of concern.  If needed, he will return to the emergency department.  ___________________________________________   FINAL CLINICAL IMPRESSION(S) / ED DIAGNOSES  Final diagnoses:  Foley catheter problem, initial encounter [facility])       Victorino Dike, FNP 09/26/19 2134    Arta Silence, MD 09/26/19 2329

## 2019-09-27 ENCOUNTER — Telehealth: Payer: Self-pay | Admitting: Urology

## 2019-09-27 NOTE — Telephone Encounter (Signed)
Pt's brother called and states that he will be caring for him after surgery 10/06/2019, he would like a call back to discuss after care.

## 2019-09-27 NOTE — Telephone Encounter (Signed)
Called pt's brother, informed him that all post operative instructions will be reviewed and give after surgery, pt will be given discharge instructions.

## 2019-09-28 ENCOUNTER — Ambulatory Visit: Payer: Medicare Other

## 2019-09-28 ENCOUNTER — Other Ambulatory Visit: Payer: Self-pay

## 2019-09-28 DIAGNOSIS — N138 Other obstructive and reflux uropathy: Secondary | ICD-10-CM

## 2019-09-28 NOTE — Progress Notes (Signed)
Patient present to office today for a UA&CX pre-op collection from foley. Patient foley was disconnected from bag and cleaned and plugged for collection. Patient sat for 30min and urine was collected into a sterile container and sent for UA and CX

## 2019-09-29 ENCOUNTER — Other Ambulatory Visit: Payer: Self-pay

## 2019-09-29 ENCOUNTER — Encounter
Admission: RE | Admit: 2019-09-29 | Discharge: 2019-09-29 | Disposition: A | Discharge status: Home / Self Care | Payer: Medicare Other | Source: Ambulatory Visit | Attending: Urology | Admitting: Urology

## 2019-09-29 HISTORY — DX: Cardiac arrhythmia, unspecified: I49.9

## 2019-09-29 HISTORY — DX: Gastro-esophageal reflux disease without esophagitis: K21.9

## 2019-09-29 HISTORY — DX: Unspecified osteoarthritis, unspecified site: M19.90

## 2019-09-29 HISTORY — DX: Calculus in bladder: N21.0

## 2019-09-29 HISTORY — DX: Anemia, unspecified: D64.9

## 2019-09-29 LAB — MICROSCOPIC EXAMINATION: RBC, Urine: >30 /hpf — AB (ref 0–2)

## 2019-09-29 LAB — URINALYSIS, COMPLETE
Bilirubin, UA: NEGATIVE
Nitrite, UA: NEGATIVE
Specific Gravity, UA: 1.02 (ref 1.005–1.030)
Urobilinogen, Ur: 1 mg/dL (ref 0.2–1.0)
pH, UA: 5 (ref 5.0–7.5)

## 2019-09-29 NOTE — Pre-Procedure Instructions (Signed)
Dr. Randa Lynn made aware of cardiac condition and clearance on the chart.  Will continue Eliquis.

## 2019-09-29 NOTE — Telephone Encounter (Signed)
Patient's brother called today asking again for a call to discuss his brother's surgery and care instructions. I explained that this was not something we could discuss with him unless we had a DPR on file. I told him that his brother would need to fill one out and list him as a person we could speak with to discuss his care. He stated that he did not live here and was not able to be here and wanted to know what we had planned for him. Once a DPR has been signed we would be able to speak with him.   Sharyn Lull

## 2019-09-29 NOTE — Pre-Procedure Instructions (Signed)
Cardiac clearance in media note.  Patient will continue eliquis.  None the morning of surgery.

## 2019-09-29 NOTE — Patient Instructions (Signed)
Your procedure is scheduled [id number] 10/30 Report to Day Surgery. To find out your arrival time please call [phone] between 1PM - 3PM on Thurs.10/29  Remember: Instructions that are not followed completely may result in serious medical risk,  up to and including death, or upon the discretion of your surgeon and anesthesiologist your  surgery may need to be rescheduled.     _X__ 1. Do not eat food after midnight the night before your procedure.                 No gum chewing or hard candies. You may drink clear liquids up to 2 hours                 before you are scheduled to arrive for your surgery- DO not drink clear                 liquids within 2 hours of the start of your surgery.                 Clear Liquids include:  water, apple juice without pulp, clear carbohydrate                 drink such as Clearfast of Gatorade, Black Coffee or Tea (Do not add                 anything to coffee or tea).  __X__2.  On the morning of surgery brush your teeth with toothpaste and water, you                may rinse your mouth with mouthwash if you wish.  Do not swallow any toothpaste of mouthwash.     _X__ 3.  No Alcohol for 24 hours before or after surgery.   _X__ 4.  Do Not Smoke or use e-cigarettes For 24 Hours Prior to Your Surgery.                 Do not use any chewable tobacco products for at least 6 hours prior to                 surgery.  ____  5.  Bring all medications with you on the day of surgery if instructed.   __x__  6.  Notify your doctor if there is any change in your medical condition      (cold, fever, infections).     Do not wear jewelry, make-up, hairpins, clips or nail polish. Do not wear lotions, powders, or perfumes. You may wear deodorant. Do not shave 48 hours prior to surgery. Men may shave face and neck. Do not bring valuables to the hospital.    [facility] is not responsible for any belongings or valuables.  Contacts,  dentures or bridgework may not be worn into surgery. Leave your suitcase in the car. After surgery it may be brought to your room. For patients admitted to the hospital, discharge time is determined by your treatment team.   Patients discharged the day of surgery will not be allowed to drive home.   Please read over the following fact sheets that you were given:     __x__ Take these medicines the morning of surgery with A SIP OF WATER:    1. atorvastatin (LIPITOR) 10 MG tablet  2. ezetimibe (ZETIA) 10 MG tablet  3. metoprolol tartrate (LOPRESSOR) 25 MG tablet  4.pantoprazole (PROTONIX) 40 MG tablet if you have at home   Take one the  night before the surgery and one the morning of surgery  5.  6.  ____ Fleet Enema (as directed)   ____ Use CHG Soap as directed  ____ Use inhalers on the day of surgery  ____ Stop metformin 2 days prior to surgery    ____ Take 1/2 of usual insulin dose the night before surgery. No insulin the morning          of surgery.   _x___ Stop apixaban (ELIQUIS) 2.5 MG TABS tablet  As per your cardiologist's recommendation   Appt. On 10/26  __x__ Stop Anti-inflammatories no ibuprofen, aleve or aspirin until after surgery   ____ Stop supplements until after surgery.    ____ Bring C-Pap to the hospital.

## 2019-09-30 ENCOUNTER — Other Ambulatory Visit: Payer: Self-pay

## 2019-09-30 ENCOUNTER — Encounter: Payer: Self-pay | Admitting: Emergency Medicine

## 2019-09-30 ENCOUNTER — Emergency Department
Admission: EM | Admit: 2019-09-30 | Discharge: 2019-09-30 | Disposition: A | Discharge status: Home / Self Care | Payer: Medicare Other | Attending: Emergency Medicine | Admitting: Emergency Medicine

## 2019-09-30 DIAGNOSIS — T83031A Leakage of indwelling urethral catheter, initial encounter: Secondary | ICD-10-CM | POA: Diagnosis present

## 2019-09-30 DIAGNOSIS — Y69 Unspecified misadventure during surgical and medical care: Secondary | ICD-10-CM | POA: Insufficient documentation

## 2019-09-30 DIAGNOSIS — Z96641 Presence of right artificial hip joint: Secondary | ICD-10-CM | POA: Diagnosis not present

## 2019-09-30 DIAGNOSIS — Z79899 Other long term (current) drug therapy: Secondary | ICD-10-CM | POA: Diagnosis not present

## 2019-09-30 DIAGNOSIS — F17210 Nicotine dependence, cigarettes, uncomplicated: Secondary | ICD-10-CM | POA: Diagnosis not present

## 2019-09-30 DIAGNOSIS — Z955 Presence of coronary angioplasty implant and graft: Secondary | ICD-10-CM | POA: Insufficient documentation

## 2019-09-30 DIAGNOSIS — Z7901 Long term (current) use of anticoagulants: Secondary | ICD-10-CM | POA: Insufficient documentation

## 2019-09-30 DIAGNOSIS — I10 Essential (primary) hypertension: Secondary | ICD-10-CM | POA: Insufficient documentation

## 2019-09-30 DIAGNOSIS — T839XXA Unspecified complication of genitourinary prosthetic device, implant and graft, initial encounter: Secondary | ICD-10-CM | POA: Insufficient documentation

## 2019-09-30 NOTE — Discharge Instructions (Signed)
Follow-up with your urologist on Monday if any continued problems.

## 2019-09-30 NOTE — ED Triage Notes (Signed)
Pt to ED via POV stating that his catheter is leaking again. Pt is in NAD at this time. Pt was ambulatory into ED.

## 2019-09-30 NOTE — ED Provider Notes (Signed)
[facility] Emergency Department Provider Note   ____________________________________________   First MD Initiated Contact with Patient 09/30/19 [phone]     (approximate)  I have reviewed the triage vital signs and the nursing notes.   HISTORY  Chief Complaint catheter leaking   HPI Roger [name] is a 73 y.o. male presents to the ED with Foley catheter problems.  Patient has been seen multiple times for his catheter leaking.  He denies any other symptoms.         Past Medical History:  Diagnosis Date  . Anemia   . Arthritis   . Atrial fibrillation (HCC)   . Bladder stone   . Dysrhythmia   . GERD (gastroesophageal reflux disease)   . Hypertension     Patient Active Problem List   Diagnosis Date Noted  . Hypotension 09/01/2019  . Ventricular fibrillation (HCC) 04/19/2019    Past Surgical History:  Procedure Laterality Date  . CATARACT EXTRACTION    . CORONARY STENT INTERVENTION N/A 04/20/2019   Procedure: CORONARY STENT INTERVENTION;  Surgeon: Alwyn Pea, MD;  Location: [facility] INVASIVE CV LAB;  Service: Cardiovascular;  Laterality: N/A;  . HIP SURGERY    . JOINT REPLACEMENT Right 2012   hip right  . LEFT HEART CATH AND CORONARY ANGIOGRAPHY Left 04/20/2019   Procedure: LEFT HEART CATH AND CORONARY ANGIOGRAPHY;  Surgeon: Laurier Nancy, MD;  Location: [facility] INVASIVE CV LAB;  Service: Cardiovascular;  Laterality: Left;    Prior to Admission medications   Medication Sig Start Date End Date Taking? Authorizing Provider  apixaban (ELIQUIS) 2.5 MG TABS tablet Take 2.5 mg by mouth 2 (two) times daily.    [provider]  atorvastatin (LIPITOR) 10 MG tablet Take 10 mg by mouth daily. 05/22/19   [provider]  Cholecalciferol (VITAMIN D3) 1.25 MG (50000 UT) CAPS Take 50,000 Units by mouth every Thursday.  05/02/19   [provider]  ezetimibe (ZETIA) 10 MG tablet Take 10 mg by mouth daily. 08/13/19   [provider]  fluticasone (FLONASE) 50 MCG/ACT nasal spray Place 2 sprays into the nose daily as needed for allergies.  04/08/19 04/07/20  [provider]  furosemide (LASIX) 20 MG tablet Take 20 mg by mouth daily. 09/12/19   [provider]  ketotifen (ZADITOR) 0.025 % ophthalmic solution Place 1 drop into both eyes daily as needed (allergies).    [provider]  metoprolol tartrate (LOPRESSOR) 25 MG tablet Take 25 mg by mouth 2 (two) times daily.    [provider]  pantoprazole (PROTONIX) 40 MG tablet Take 1 tablet (40 mg total) by mouth daily. Patient taking differently: Take 40 mg by mouth daily as needed (acid reflux).  04/21/19   Adrian Saran, MD  spironolactone (ALDACTONE) 25 MG tablet Take 25 mg by mouth daily. 08/13/19   [provider]  lisinopril (PRINIVIL,ZESTRIL) 10 MG tablet Take 5 mg by mouth daily.   06/10/19  [provider]    Allergies Amoxicillin, Sulfa antibiotics, Pravastatin, and Rosuvastatin  No family history on file.  Social History Social History   Tobacco Use  . Smoking status: Current Every Day Smoker    Packs/day: 0.50    Types: Cigarettes  . Smokeless tobacco: Never Used  Substance Use Topics  . Alcohol use: Yes    Comment: rare  . Drug use: No    Review of Systems Constitutional: No fever/chills Cardiovascular: Denies chest pain. Respiratory: Denies shortness of breath. Gastrointestinal:  No nausea, no vomiting.  Genitourinary: History of BPH.  Positive for Foley catheter. Musculoskeletal: Negative for muscle aches. Skin: Negative for rash. ____________________________________________   PHYSICAL EXAM:  VITAL SIGNS: ED Triage Vitals  Enc Vitals Group     BP 09/30/19 0848 (!) 119/57     Pulse Rate 09/30/19 0848 73     Resp 09/30/19 0848 18     Temp 09/30/19 0848 97.8 F (36.6 C)     Temp Source 09/30/19 0848 Oral     SpO2 09/30/19 0848 100 %     Weight --      Height --      Head  Circumference --      Peak Flow --      Pain Score 09/30/19 0845 0     Pain Loc --      Pain Edu? --      Excl. in [place]? --    Constitutional: Alert and oriented. Well appearing and in no acute distress. Eyes: Conjunctivae are normal.  Head: Atraumatic. Neck: No stridor.   Cardiovascular: Normal rate, regular rhythm. Grossly normal heart sounds.  Good peripheral circulation. Respiratory: Normal respiratory effort.  No retractions. Lungs CTAB. Genitourinary: Foley catheter in place.  Circumcised penis without erythema, lesions or edema.  No active drainage is noted however patient's underwear is soaked. Musculoskeletal: No gross deformity.  Patient is able to move upper and lower extremities without difficulty. Neurologic:  Normal speech and language. No gross focal neurologic deficits are appreciated.i Skin:  Skin is warm, dry and intact. No rash noted. Psychiatric: Mood and affect are normal. Speech and behavior are normal.  ____________________________________________   LABS (all labs ordered are listed, but only abnormal results are displayed)  Labs Reviewed - No data to display   PROCEDURES  Procedure(s) performed (including Critical Care):  Procedures Nurses replaced Foley catheter.  ____________________________________________   INITIAL IMPRESSION / ASSESSMENT AND PLAN / ED COURSE  As part of my medical decision making, I reviewed the following data within the electronic MEDICAL RECORD NUMBER Notes from prior ED visits and [place] Controlled Substance Database  73 year old male presents to the ED with complaint of Foley catheter leaking.  He denies any other symptoms and has been seen multiple times in the past for the same.  Catheter was changed.  Patient is encouraged to follow-up with his urologist.  ____________________________________________   FINAL CLINICAL IMPRESSION(S) / ED DIAGNOSES  Final diagnoses:  Problem with Foley catheter, initial encounter [facility])     ED  Discharge Orders    None       Note:  This document was prepared using Dragon voice recognition software and may include unintentional dictation errors.    Johnn Hai, PA-C 09/30/19 1010    Lavonia Drafts, MD 09/30/19 1329

## 2019-09-30 NOTE — ED Notes (Signed)
This RN and Romie Minus replaced pt urinary cath with a 14in cath.

## 2019-10-01 LAB — CULTURE, URINE COMPREHENSIVE

## 2019-10-02 ENCOUNTER — Other Ambulatory Visit: Payer: Self-pay

## 2019-10-02 ENCOUNTER — Emergency Department
Admission: EM | Admit: 2019-10-02 | Discharge: 2019-10-02 | Discharge status: Absent without leave | Payer: Medicare Other | Source: Home / Self Care

## 2019-10-02 ENCOUNTER — Encounter: Payer: Self-pay | Admitting: Emergency Medicine

## 2019-10-02 ENCOUNTER — Emergency Department
Admission: EM | Admit: 2019-10-02 | Discharge: 2019-10-02 | Disposition: A | Discharge status: Home / Self Care | Payer: Medicare Other | Attending: Emergency Medicine | Admitting: Emergency Medicine

## 2019-10-02 DIAGNOSIS — I10 Essential (primary) hypertension: Secondary | ICD-10-CM | POA: Diagnosis not present

## 2019-10-02 DIAGNOSIS — F17210 Nicotine dependence, cigarettes, uncomplicated: Secondary | ICD-10-CM | POA: Diagnosis not present

## 2019-10-02 DIAGNOSIS — T839XXA Unspecified complication of genitourinary prosthetic device, implant and graft, initial encounter: Secondary | ICD-10-CM

## 2019-10-02 DIAGNOSIS — Z96641 Presence of right artificial hip joint: Secondary | ICD-10-CM | POA: Diagnosis not present

## 2019-10-02 DIAGNOSIS — T83031A Leakage of indwelling urethral catheter, initial encounter: Secondary | ICD-10-CM | POA: Insufficient documentation

## 2019-10-02 DIAGNOSIS — Y733 Surgical instruments, materials and gastroenterology and urology devices (including sutures) associated with adverse incidents: Secondary | ICD-10-CM | POA: Insufficient documentation

## 2019-10-02 DIAGNOSIS — Z79899 Other long term (current) drug therapy: Secondary | ICD-10-CM | POA: Insufficient documentation

## 2019-10-02 NOTE — ED Notes (Signed)
See triage note  Presents with foley cath leaking   States he had it placed on Friday and is scheduled for surgery this Friday  Noticed that it was leaking around penis

## 2019-10-02 NOTE — ED Triage Notes (Signed)
Pt to ED states catheter clamp opened today and tried closing it back and wants to make sure everything repositioned right.  Denies new pain or issues.

## 2019-10-02 NOTE — ED Notes (Signed)
Clamp on catheter adjusted by this RN and in place correctly.  Catheter intact and no leaks at this time.

## 2019-10-02 NOTE — ED Provider Notes (Signed)
[facility] Emergency Department Provider Note   ____________________________________________   First MD Initiated Contact with Patient 10/02/19 0719     (approximate)  I have reviewed the triage vital signs and the nursing notes.   HISTORY  Chief Complaint catheter leaking    HPI Roger [name] is a 73 y.o. male patient presents with leaking Foley catheter.  Patient state he awakened this morning with wet underwear.  Has recurrent episodes of Foley leakage.  Last visit was 3 days ago.  Patient is scheduled for surgery later this week.  Denies any other symptoms.         Past Medical History:  Diagnosis Date  . Anemia   . Arthritis   . Atrial fibrillation ([place])   . Bladder stone   . Dysrhythmia   . GERD (gastroesophageal reflux disease)   . Hypertension     Patient Active Problem List   Diagnosis Date Noted  . Hypotension 09/01/2019  . Ventricular fibrillation ([place]) 04/19/2019    Past Surgical History:  Procedure Laterality Date  . CATARACT EXTRACTION    . CORONARY STENT INTERVENTION N/A 04/20/2019   Procedure: CORONARY STENT INTERVENTION;  Surgeon: Yolonda Kida, MD;  Location: [place] CV LAB;  Service: Cardiovascular;  Laterality: N/A;  . HIP SURGERY    . JOINT REPLACEMENT Right 2012   hip right  . LEFT HEART CATH AND CORONARY ANGIOGRAPHY Left 04/20/2019   Procedure: LEFT HEART CATH AND CORONARY ANGIOGRAPHY;  Surgeon: Dionisio David, MD;  Location: [place] CV LAB;  Service: Cardiovascular;  Laterality: Left;    Prior to Admission medications   Medication Sig Start Date End Date Taking? Authorizing Provider  apixaban (ELIQUIS) 2.5 MG TABS tablet Take 2.5 mg by mouth 2 (two) times daily.    [provider]  atorvastatin (LIPITOR) 10 MG tablet Take 10 mg by mouth daily. 05/22/19   [provider]  Cholecalciferol (VITAMIN D3) 1.25 MG (50000 UT) CAPS Take 50,000 Units by mouth every Thursday.  05/02/19    [provider]  ezetimibe (ZETIA) 10 MG tablet Take 10 mg by mouth daily. 08/13/19   [provider]  fluticasone (FLONASE) 50 MCG/ACT nasal spray Place 2 sprays into the nose daily as needed for allergies.  04/08/19 04/07/20  [provider]  furosemide (LASIX) 20 MG tablet Take 20 mg by mouth daily. 09/12/19   [provider]  ketotifen (ZADITOR) 0.025 % ophthalmic solution Place 1 drop into both eyes daily as needed (allergies).    [provider]  metoprolol tartrate (LOPRESSOR) 25 MG tablet Take 25 mg by mouth 2 (two) times daily.    [provider]  pantoprazole (PROTONIX) 40 MG tablet Take 1 tablet (40 mg total) by mouth daily. Patient taking differently: Take 40 mg by mouth daily as needed (acid reflux).  04/21/19   Bettey Costa, MD  spironolactone (ALDACTONE) 25 MG tablet Take 25 mg by mouth daily. 08/13/19   [provider]  lisinopril (PRINIVIL,ZESTRIL) 10 MG tablet Take 5 mg by mouth daily.   06/10/19  [provider]    Allergies Amoxicillin, Sulfa antibiotics, Pravastatin, and Rosuvastatin  No family history on file.  Social History Social History   Tobacco Use  . Smoking status: Current Every Day Smoker    Packs/day: 0.50    Types: Cigarettes  . Smokeless tobacco: Never Used  Substance Use Topics  . Alcohol use: Yes    Comment: rare  . Drug use:  No    Review of Systems Constitutional: No fever/chills Eyes: No visual changes. ENT: No sore throat. Cardiovascular: Denies chest pain. Respiratory: Denies shortness of breath. Gastrointestinal: No abdominal pain.  No nausea, no vomiting.  No diarrhea.  No constipation. Genitourinary: Negative for dysuria.  History of BPH.  Foley in place. Musculoskeletal: Negative for back pain. Skin: Negative for rash. Neurological: Negative for headaches, focal weakness or numbness. Allergic/Immunilogical: Amoxicillin, sulfa antibiotics, and statins.  ____________________________________________   PHYSICAL EXAM:  VITAL SIGNS: ED Triage Vitals  Enc Vitals Group     BP 10/02/19 0642 127/70     Pulse Rate 10/02/19 0642 74     Resp 10/02/19 0642 14     Temp 10/02/19 0642 98.2 F (36.8 C)     Temp Source 10/02/19 0642 Oral     SpO2 10/02/19 0642 96 %     Weight 10/02/19 0643 206 lb (93.4 kg)     Height 10/02/19 0643 5\' 7"  (1.702 m)     Head Circumference --      Peak Flow --      Pain Score 10/02/19 0643 0     Pain Loc --      Pain Edu? --      Excl. in [place]? --    Constitutional: Alert and oriented. Well appearing and in no acute distress. Cardiovascular: Normal rate, regular rhythm. Grossly normal heart sounds.  Good peripheral circulation. Respiratory: Normal respiratory effort.  No retractions. Lungs CTAB. Gastrointestinal: Soft and nontender. No distention. No abdominal bruits. No CVA tenderness. Genitourinary: Circumcised male with Foley catheter.  No active drainage at this time. Skin:  Skin is warm, dry and intact. No rash noted. Psychiatric: Mood and affect are normal. Speech and behavior are normal.  ____________________________________________   LABS (all labs ordered are listed, but only abnormal results are displayed)  Labs Reviewed - No data to display ____________________________________________  EKG   ____________________________________________  RADIOLOGY  ED MD interpretation:    Official radiology report(s): No results found.  ____________________________________________   PROCEDURES  Procedure(s) performed (including Critical Care):  Procedures   ____________________________________________   INITIAL IMPRESSION / ASSESSMENT AND PLAN / ED COURSE  As part of my medical decision making, I reviewed the following data within the electronic MEDICAL RECORD NUMBER         Roger [name] was evaluated in Emergency Department on 10/02/2019 for the symptoms described in the history of present  illness. He was evaluated in the context of the global COVID-19 pandemic, which necessitated consideration that the patient might be at risk for infection with the SARS-CoV-2 virus that causes COVID-19. Institutional protocols and algorithms that pertain to the evaluation of patients at risk for COVID-19 are in a state of rapid change based on information released by regulatory bodies including the CDC and federal and state organizations. These policies and algorithms were followed during the patient's care in the ED.  Patient presents with complaint of Foley leakage.  New Foley catheter placed by nurse.  Patient advised to follow-up urologist and pending surgery.     ____________________________________________   FINAL CLINICAL IMPRESSION(S) / ED DIAGNOSES  Final diagnoses:  Problem with Foley catheter, initial encounter [facility])     ED Discharge Orders    None       Note:  This document was prepared using Dragon voice recognition software and may include unintentional dictation errors.    [facility], PA-C 10/02/19 10/04/19    Darliss Ridgel, MD 10/02/19 [phone]

## 2019-10-02 NOTE — ED Triage Notes (Signed)
Pt with indwelling urinary catheter. Pt states this am began to leak urine from penis around catheter. Pt denies other issues. Pt has had catheter in since 08/28/2019.

## 2019-10-03 ENCOUNTER — Other Ambulatory Visit
Admission: RE | Admit: 2019-10-03 | Discharge: 2019-10-03 | Disposition: A | Discharge status: Home / Self Care | Payer: Medicare Other | Source: Ambulatory Visit | Attending: Urology | Admitting: Urology

## 2019-10-03 DIAGNOSIS — Z01812 Encounter for preprocedural laboratory examination: Secondary | ICD-10-CM | POA: Insufficient documentation

## 2019-10-03 DIAGNOSIS — Z20828 Contact with and (suspected) exposure to other viral communicable diseases: Secondary | ICD-10-CM | POA: Diagnosis not present

## 2019-10-03 LAB — SARS CORONAVIRUS 2 (TAT 6-24 HRS): SARS Coronavirus 2: NEGATIVE

## 2019-10-05 MED ORDER — CIPROFLOXACIN IN D5W 400 MG/200ML IV SOLN
400.0000 mg | INTRAVENOUS | Status: AC
  Administered 2019-10-06: 400 mg via INTRAVENOUS

## 2019-10-06 ENCOUNTER — Ambulatory Visit
Admission: RE | Admit: 2019-10-06 | Discharge: 2019-10-06 | Disposition: A | Discharge status: Home / Self Care | Payer: Medicare Other | Attending: Urology | Admitting: Urology

## 2019-10-06 ENCOUNTER — Other Ambulatory Visit: Payer: Self-pay

## 2019-10-06 ENCOUNTER — Ambulatory Visit: Payer: Medicare Other | Admitting: Certified Registered Nurse Anesthetist

## 2019-10-06 ENCOUNTER — Encounter
Admission: RE | Disposition: A | Discharge status: Home / Self Care | Payer: Self-pay | Source: Home / Self Care | Attending: Urology

## 2019-10-06 ENCOUNTER — Encounter: Payer: Self-pay | Admitting: *Deleted

## 2019-10-06 DIAGNOSIS — N411 Chronic prostatitis: Secondary | ICD-10-CM | POA: Diagnosis not present

## 2019-10-06 DIAGNOSIS — Z7901 Long term (current) use of anticoagulants: Secondary | ICD-10-CM | POA: Diagnosis not present

## 2019-10-06 DIAGNOSIS — N401 Enlarged prostate with lower urinary tract symptoms: Secondary | ICD-10-CM

## 2019-10-06 DIAGNOSIS — F17210 Nicotine dependence, cigarettes, uncomplicated: Secondary | ICD-10-CM | POA: Insufficient documentation

## 2019-10-06 DIAGNOSIS — K219 Gastro-esophageal reflux disease without esophagitis: Secondary | ICD-10-CM | POA: Diagnosis not present

## 2019-10-06 DIAGNOSIS — N210 Calculus in bladder: Secondary | ICD-10-CM

## 2019-10-06 DIAGNOSIS — R339 Retention of urine, unspecified: Secondary | ICD-10-CM

## 2019-10-06 DIAGNOSIS — I2510 Atherosclerotic heart disease of native coronary artery without angina pectoris: Secondary | ICD-10-CM | POA: Insufficient documentation

## 2019-10-06 DIAGNOSIS — Z96641 Presence of right artificial hip joint: Secondary | ICD-10-CM | POA: Diagnosis not present

## 2019-10-06 DIAGNOSIS — R338 Other retention of urine: Secondary | ICD-10-CM | POA: Diagnosis not present

## 2019-10-06 DIAGNOSIS — Z955 Presence of coronary angioplasty implant and graft: Secondary | ICD-10-CM | POA: Diagnosis not present

## 2019-10-06 DIAGNOSIS — I10 Essential (primary) hypertension: Secondary | ICD-10-CM | POA: Diagnosis not present

## 2019-10-06 DIAGNOSIS — I4891 Unspecified atrial fibrillation: Secondary | ICD-10-CM | POA: Diagnosis not present

## 2019-10-06 DIAGNOSIS — N138 Other obstructive and reflux uropathy: Secondary | ICD-10-CM | POA: Insufficient documentation

## 2019-10-06 DIAGNOSIS — I252 Old myocardial infarction: Secondary | ICD-10-CM | POA: Insufficient documentation

## 2019-10-06 DIAGNOSIS — M1990 Unspecified osteoarthritis, unspecified site: Secondary | ICD-10-CM | POA: Diagnosis not present

## 2019-10-06 HISTORY — PX: HOLEP-LASER ENUCLEATION OF THE PROSTATE WITH MORCELLATION: SHX6641

## 2019-10-06 HISTORY — PX: CYSTOSCOPY WITH LITHOLAPAXY: SHX1425

## 2019-10-06 SURGERY — CYSTOSCOPY, WITH BLADDER CALCULUS LITHOLAPAXY
Anesthesia: General | Site: Prostate

## 2019-10-06 MED ORDER — LIDOCAINE HCL (PF) 2 % IJ SOLN
INTRAMUSCULAR | Status: AC
  Filled 2019-10-06: qty 10

## 2019-10-06 MED ORDER — PROPOFOL 10 MG/ML IV BOLUS
INTRAVENOUS | Status: DC | PRN
  Administered 2019-10-06: 130 mg via INTRAVENOUS

## 2019-10-06 MED ORDER — LIDOCAINE HCL 4 % MT SOLN
OROMUCOSAL | Status: DC | PRN
  Administered 2019-10-06: 4 mL via TOPICAL

## 2019-10-06 MED ORDER — SUGAMMADEX SODIUM 200 MG/2ML IV SOLN
INTRAVENOUS | Status: AC
  Filled 2019-10-06: qty 2

## 2019-10-06 MED ORDER — ROCURONIUM BROMIDE 50 MG/5ML IV SOLN
INTRAVENOUS | Status: AC
  Filled 2019-10-06: qty 1

## 2019-10-06 MED ORDER — SUGAMMADEX SODIUM 200 MG/2ML IV SOLN
INTRAVENOUS | Status: DC | PRN
  Administered 2019-10-06: 200 mg via INTRAVENOUS

## 2019-10-06 MED ORDER — EPHEDRINE SULFATE 50 MG/ML IJ SOLN
INTRAMUSCULAR | Status: DC | PRN
  Administered 2019-10-06: 15 mg via INTRAVENOUS
  Administered 2019-10-06: 10 mg via INTRAVENOUS

## 2019-10-06 MED ORDER — HYDROCODONE-ACETAMINOPHEN 5-325 MG PO TABS: 1.0000 | ORAL_TABLET | Freq: Four times a day (QID) | ORAL | 0 refills | Status: AC | PRN

## 2019-10-06 MED ORDER — ONDANSETRON HCL 4 MG/2ML IJ SOLN
INTRAMUSCULAR | Status: DC | PRN
  Administered 2019-10-06: 4 mg via INTRAVENOUS

## 2019-10-06 MED ORDER — PROPOFOL 10 MG/ML IV BOLUS
INTRAVENOUS | Status: AC
  Filled 2019-10-06: qty 20

## 2019-10-06 MED ORDER — LACTATED RINGERS IV SOLN
INTRAVENOUS | Status: DC
  Administered 2019-10-06: 07:00:00 via INTRAVENOUS

## 2019-10-06 MED ORDER — ONDANSETRON HCL 4 MG/2ML IJ SOLN
INTRAMUSCULAR | Status: AC
  Filled 2019-10-06: qty 2

## 2019-10-06 MED ORDER — FENTANYL CITRATE (PF) 100 MCG/2ML IJ SOLN
INTRAMUSCULAR | Status: AC
  Filled 2019-10-06: qty 2

## 2019-10-06 MED ORDER — CIPROFLOXACIN IN D5W 400 MG/200ML IV SOLN
INTRAVENOUS | Status: AC
  Filled 2019-10-06: qty 200

## 2019-10-06 MED ORDER — ROCURONIUM BROMIDE 100 MG/10ML IV SOLN
INTRAVENOUS | Status: DC | PRN
  Administered 2019-10-06: 20 mg via INTRAVENOUS
  Administered 2019-10-06: 50 mg via INTRAVENOUS

## 2019-10-06 MED ORDER — DEXAMETHASONE SODIUM PHOSPHATE 10 MG/ML IJ SOLN
INTRAMUSCULAR | Status: DC | PRN
  Administered 2019-10-06: 10 mg via INTRAVENOUS

## 2019-10-06 MED ORDER — FENTANYL CITRATE (PF) 250 MCG/5ML IJ SOLN
INTRAMUSCULAR | Status: DC | PRN
  Administered 2019-10-06 (×2): 50 ug via INTRAVENOUS

## 2019-10-06 MED ORDER — DEXAMETHASONE SODIUM PHOSPHATE 10 MG/ML IJ SOLN
INTRAMUSCULAR | Status: AC
  Filled 2019-10-06: qty 1

## 2019-10-06 MED ORDER — BELLADONNA ALKALOIDS-OPIUM 16.2-60 MG RE SUPP
RECTAL | Status: DC | PRN
  Administered 2019-10-06: 1 via RECTAL

## 2019-10-06 MED ORDER — LIDOCAINE HCL (CARDIAC) PF 100 MG/5ML IV SOSY
PREFILLED_SYRINGE | INTRAVENOUS | Status: DC | PRN
  Administered 2019-10-06: 100 mg via INTRATRACHEAL

## 2019-10-06 MED ORDER — BELLADONNA ALKALOIDS-OPIUM 16.2-60 MG RE SUPP
RECTAL | Status: AC
  Filled 2019-10-06: qty 1

## 2019-10-06 MED ORDER — FENTANYL CITRATE (PF) 100 MCG/2ML IJ SOLN: 25.0000 ug | INTRAMUSCULAR | Status: DC | PRN

## 2019-10-06 MED ORDER — ONDANSETRON HCL 4 MG/2ML IJ SOLN: 4.0000 mg | Freq: Once | INTRAMUSCULAR | Status: DC | PRN

## 2019-10-06 SURGICAL SUPPLY — 36 items
ADAPTER IRRIG TUBE 2 SPIKE SOL (ADAPTER) ×8 IMPLANT
BAG DRAIN CYSTO-URO LG1000N (MISCELLANEOUS) ×4 IMPLANT
BAG URO DRAIN 4000ML (MISCELLANEOUS) ×4 IMPLANT
CATH FOLEY 3WAY 30CC 24FR (CATHETERS) ×2
CATH URETL 5X70 OPEN END (CATHETERS) ×4 IMPLANT
CATH URTH STD 24FR FL 3W 2 (CATHETERS) ×2 IMPLANT
CONTAINER COLLECT MORCELLATR (MISCELLANEOUS) ×2 IMPLANT
DRAPE UTILITY 15X26 TOWEL STRL (DRAPES) IMPLANT
ELECT BIVAP BIPO 22/24 DONUT (ELECTROSURGICAL)
ELECTRD BIVAP BIPO 22/24 DONUT (ELECTROSURGICAL) IMPLANT
FILTER OVERFLOW MORCELLATOR (FILTER) ×2 IMPLANT
GLOVE BIOGEL PI IND STRL 7.5 (GLOVE) ×2 IMPLANT
GLOVE BIOGEL PI INDICATOR 7.5 (GLOVE) ×2
GOWN STRL REUS W/ TWL LRG LVL3 (GOWN DISPOSABLE) ×2 IMPLANT
GOWN STRL REUS W/ TWL XL LVL3 (GOWN DISPOSABLE) ×2 IMPLANT
GOWN STRL REUS W/TWL LRG LVL3 (GOWN DISPOSABLE) ×2
GOWN STRL REUS W/TWL XL LVL3 (GOWN DISPOSABLE) ×2
GUIDEWIRE STR DUAL SENSOR (WIRE) IMPLANT
HOLDER FOLEY CATH W/STRAP (MISCELLANEOUS) ×4 IMPLANT
KIT PROBE TRILOGY 3.9X350 (MISCELLANEOUS) IMPLANT
KIT TURNOVER CYSTO (KITS) ×4 IMPLANT
LASER FIBER 550M SMARTSCOPE (Laser) ×4 IMPLANT
MORCELLATOR COLLECT CONTAINER (MISCELLANEOUS) ×4
MORCELLATOR OVERFLOW FILTER (FILTER) ×4
MORCELLATOR ROTATION 4.75 335 (MISCELLANEOUS) ×4 IMPLANT
PACK CYSTO AR (MISCELLANEOUS) ×4 IMPLANT
SET CYSTO W/LG BORE CLAMP LF (SET/KITS/TRAYS/PACK) ×4 IMPLANT
SET IRRIG Y TYPE TUR BLADDER L (SET/KITS/TRAYS/PACK) ×4 IMPLANT
SLEEVE PROTECTION STRL DISP (MISCELLANEOUS) ×8 IMPLANT
SOL .9 NS 3000ML IRR  AL (IV SOLUTION) ×8
SOL .9 NS 3000ML IRR UROMATIC (IV SOLUTION) ×8 IMPLANT
SURGILUBE 2OZ TUBE FLIPTOP (MISCELLANEOUS) ×4 IMPLANT
SYRINGE IRR TOOMEY STRL 70CC (SYRINGE) ×4 IMPLANT
TUBE PUMP MORCELLATOR PIRANHA (TUBING) ×4 IMPLANT
WATER STERILE IRR 1000ML POUR (IV SOLUTION) ×4 IMPLANT
WATER STERILE IRR 3000ML UROMA (IV SOLUTION) ×4 IMPLANT

## 2019-10-06 NOTE — Op Note (Addendum)
Date of procedure: 10/06/19  Preoperative diagnosis:  1. 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 1.5 cm bladder stone  Postoperative diagnosis:  1. Same  Procedure: 1. HoLEP 2. Cystolitholopaxy <2cm  Surgeon: Nickolas Madrid, MD  Anesthesia: General  Complications: None  Intraoperative findings:  1.  Large prostate with obstructing lateral lobes, small median lobe 2.  1.5 cm black bladder stone 3.  No mucosal lesions, ureteral orifices orthotopic 4.  Ureteral orifice ease and verumontanum intact at conclusion of case  EBL: 10 mL  Specimens: Prostate chips  Enucleation time: 45 minutes  Morcellation time: 4 minutes  Intra-op weight: 42g  Drains: 41F 3-way, 50cc in balloon  Indication: Roger [name] is a 73 y.o. patient with Foley dependent urinary retention from BPH who elected for an outlet procedure with HOLEP.  After reviewing the management options for treatment, they elected to proceed with the above surgical procedure(s). We have discussed the potential benefits and risks of the procedure, side effects of the proposed treatment, the likelihood of the patient achieving the goals of the procedure, and any potential problems that might occur during the procedure or recuperation.  We specifically discussed the risks of bleeding, infection, hematuria and clot retention, need for additional procedures, possible overnight hospital stay, temporary urgency and incontinence, rare long-term incontinence, and retrograde ejaculation.  Informed consent has been obtained.   Description of procedure:  The patient was taken to the operating room and general anesthesia was induced.  The patient was placed in the dorsal lithotomy position, prepped and draped in the usual sterile fashion, and preoperative antibiotics were administered.  SCDs were placed for DVT prophylaxis.  A preoperative time-out was performed.   The 45 French continuous flow resectoscope was  inserted into the urethra using the visual obturator  The prostate was large with obstructive lateral lobes and a small median lobe. The bladder was thoroughly inspected and the mucosa was mildly erythematous secondary to his indwelling Foley, but there were no suspicious lesions.  There was a 1.5 cm black bladder stone.  The ureteral orifices were located in orthotopic position.   The 500 m laser fiber was set to 1.0 J and 10 Hz, and the bladder stone was methodically fragmented and washed free from the bladder.  The laser was then set to 2 J and 50 Hz and was used to make an incision at the 6 o'clock position to the level of the capsule from the bladder neck to the verumontanum.  The lateral lobes were then incised circumferentially until they were disconnected from the surrounding tissue.  The capsule was examined and laser was used for meticulous hemostasis.  The 44 French resectoscope was then switched out for the 71 French nephroscope and the lobes were morcellated and the tissue sent to pathology.  A 24 French three-way catheter was inserted easily, and CBI was initiated.  50 cc were placed in the balloon.  Urine was clear.  The patient tolerated the procedure well without any immediate complications and was extubated and transferred to the recovery room in stable condition.  Urine was clear on fast CBI.  Disposition: Stable to PACU  Plan: Wean CBI in PACU, anticipate discharge home today with void trial in clinic in 2-3 days Okay to resume Eliquis Monday 11/2  Nickolas Madrid, MD 10/06/2019

## 2019-10-06 NOTE — Anesthesia Preprocedure Evaluation (Signed)
Anesthesia Evaluation  Patient identified by MRN, date of birth, ID band Patient awake    Reviewed: Allergy & Precautions, H&P , NPO status , Patient's Chart, lab work & pertinent test results, reviewed documented beta blocker date and time   Airway Mallampati: II  TM Distance: >3 FB Neck ROM: full    Dental no notable dental hx. (+) Edentulous Upper, Edentulous Lower   Pulmonary neg pulmonary ROS, Current Smoker and Patient abstained from smoking.,    Pulmonary exam normal        Cardiovascular Exercise Tolerance: Good hypertension, (-) angina+ CAD, + Past MI and + Cardiac Stents  (-) CABG Normal cardiovascular exam+ dysrhythmias Atrial Fibrillation (-) Valvular Problems/Murmurs     Neuro/Psych negative neurological ROS  negative psych ROS   GI/Hepatic Neg liver ROS, GERD  ,  Endo/Other  negative endocrine ROS  Renal/GU Renal disease (kidney stones)  negative genitourinary   Musculoskeletal   Abdominal   Peds  Hematology negative hematology ROS (+)   Anesthesia Other Findings Past Medical History: No date: Anemia No date: Arthritis No date: Atrial fibrillation (HCC) No date: Bladder stone No date: Dysrhythmia No date: GERD (gastroesophageal reflux disease) No date: Hypertension   Reproductive/Obstetrics negative OB ROS                             Anesthesia Physical Anesthesia Plan  ASA: III  Anesthesia Plan: General   Post-op Pain Management:    Induction: Intravenous  PONV Risk Score and Plan: 1  Airway Management Planned: Oral ETT and LMA  Additional Equipment:   Intra-op Plan:   Post-operative Plan: Extubation in OR  Informed Consent: I have reviewed the patients History and Physical, chart, labs and discussed the procedure including the risks, benefits and alternatives for the proposed anesthesia with the patient or authorized representative who has indicated  his/her understanding and acceptance.     Dental Advisory Given  Plan Discussed with: Anesthesiologist, CRNA and Surgeon  Anesthesia Plan Comments:         Anesthesia Quick Evaluation

## 2019-10-06 NOTE — Op Note (Deleted)
10/06/19 7:14 AM   Roger [name] [birth year] [id number]   HPI: [name] is a 73 year old male with cardiac history here today for HOLEP for Foley dependent urinary retention.  He has had numerous ER visits over the last month for catheter related issues, and has failed multiple voiding trials.  He presents today for definitive management with follow-up.  Notably, I was okay proceeding with HOLEP with patient anticoagulated on Eliquis with his cardiac history, however his Eliquis was stopped by his cardiologist on 10/26.  He denies any fevers, chills, shortness of breath, or chest pain.   PMH: Past Medical History:  Diagnosis Date  . Anemia   . Arthritis   . Atrial fibrillation ([place])   . Bladder stone   . Dysrhythmia   . GERD (gastroesophageal reflux disease)   . Hypertension     Surgical History: Past Surgical History:  Procedure Laterality Date  . CATARACT EXTRACTION    . CORONARY STENT INTERVENTION N/A 04/20/2019   Procedure: CORONARY STENT INTERVENTION;  Surgeon: Yolonda Kida, MD;  Location: [facility];  Service: Cardiovascular;  Laterality: N/A;  . HIP SURGERY    . JOINT REPLACEMENT Right 2012   hip right  . LEFT HEART CATH AND CORONARY ANGIOGRAPHY Left 04/20/2019   Procedure: LEFT HEART CATH AND CORONARY ANGIOGRAPHY;  Surgeon: Dionisio David, MD;  Location: [facility];  Service: Cardiovascular;  Laterality: Left;     Family History: History reviewed. No pertinent family history.  Social History:  reports that he has been smoking cigarettes. He has been smoking about 0.50 packs per day. He has never used smokeless tobacco. He reports current alcohol use. He reports that he does not use drugs.  ROS: Please see flowsheet from today's date for complete review of systems.  Physical Exam: BP 115/77   Pulse 67   Temp 97.8 F (36.6 C) (Tympanic)   Resp 14   Ht 5\' 8"  (1.727 m)   Wt 90 kg   SpO2 100%   BMI 30.17 kg/m     Constitutional:  Alert and oriented, No acute distress. Cardiovascular: Irregularly irregular Respiratory: Clear to auscultation bilaterally GI: Abdomen is soft, nontender, nondistended, no abdominal masses GU: Foley with clear yellow urine Lymph: No cervical or inguinal lymphadenopathy. Skin: No rashes, bruises or suspicious lesions. Neurologic: Grossly intact, no focal deficits, moving all 4 extremities. Psychiatric: Normal mood and affect.  Laboratory Data: Urine culture 10/22 no growth  Pertinent Imaging: Prostate measured 70 g on CT, 1 cm bladder stone  Assessment & Plan:   The patient is a 73 year old male with cardiac history and Foley dependent urinary retention with a 70 g prostate on recent CT and a 1 cm bladder stone here today for HOLEP and cystolitholapaxy.  We discussed the risks and benefits of HOLEP at length.  The procedure requires general anesthesia and takes 2 to 3 hours, and a holmium laser is used to enucleate the prostate and push this tissue into the bladder.  A morcellator is then used to remove this tissue, which is sent for pathology.  Majority of patients are able to discharge the same day with a catheter in place for 2 to 3 days, and will follow-up in clinic for a voiding trial.  Approximately 10% of patients will be admitted overnight to monitor the urine, or if they have multiple comorbidities.  We specifically discussed the risks of bleeding, infection, retrograde ejaculation, temporary urgency and urge incontinence, very low risk of  long-term incontinence, and possible need for additional procedures.  Proceed with HOLEP and cystolitholapaxy today  Sondra Come, MD  [facility] [address] [place], [place] [address] [phone]

## 2019-10-06 NOTE — Discharge Instructions (Signed)

## 2019-10-06 NOTE — Transfer of Care (Signed)
Immediate Anesthesia Transfer of Care Note  Patient: Roger [name]  Procedure(s) Performed: CYSTOSCOPY WITH LITHOLAPAXY (N/A Bladder) HOLEP-LASER ENUCLEATION OF THE PROSTATE WITH MORCELLATION (N/A Prostate)  Patient Location: PACU  Anesthesia Type:General  Level of Consciousness: awake, alert , oriented and patient cooperative  Airway & Oxygen Therapy: Patient Spontanous Breathing and Patient connected to face mask oxygen  Post-op Assessment: Report given to RN and Post -op Vital signs reviewed and stable  Post vital signs: Reviewed and stable  Last Vitals:  Vitals Value Taken Time  BP 134/75 10/06/19 0910  Temp    Pulse 63 10/06/19 0911  Resp 19 10/06/19 0911  SpO2 100 % 10/06/19 0911  Vitals shown include unvalidated device data.  Last Pain:  Vitals:   10/06/19 0626  TempSrc: Tympanic  PainSc: 0-No pain         Complications: No apparent anesthesia complications

## 2019-10-06 NOTE — Anesthesia Procedure Notes (Signed)
Procedure Name: Intubation Date/Time: 10/06/2019 7:43 AM Performed by: Frazier, Susan, CRNA Pre-anesthesia Checklist: Patient identified, Emergency Drugs available, Suction available and Patient being monitored Patient Re-evaluated:Patient Re-evaluated prior to induction Oxygen Delivery Method: Circle system utilized Preoxygenation: Pre-oxygenation with 100% oxygen Induction Type: IV induction Ventilation: Mask ventilation without difficulty Laryngoscope Size: Miller and 2 Grade View: Grade I Tube type: Oral Tube size: 7.5 mm Number of attempts: 1 Airway Equipment and Method: Stylet and LTA kit utilized Placement Confirmation: ETT inserted through vocal cords under direct vision,  positive ETCO2 and breath sounds checked- equal and bilateral Secured at: 21 cm Tube secured with: Tape Dental Injury: Teeth and Oropharynx as per pre-operative assessment        

## 2019-10-06 NOTE — H&P (Signed)
10/06/19 7:14 AM   Roger [name] [birth year] [id number]   HPI: Roger [name] is a 73 year old male with cardiac history here today for HOLEP for Foley dependent urinary retention.  He has had numerous ER visits over the last month for catheter related issues, and has failed multiple voiding trials.  He presents today for definitive management with follow-up.  Notably, I was okay proceeding with HOLEP with patient anticoagulated on Eliquis with his cardiac history, however his Eliquis was stopped by his cardiologist on 10/26.  He denies any fevers, chills, shortness of breath, or chest pain.   PMH: Past Medical History:  Diagnosis Date  . Anemia   . Arthritis   . Atrial fibrillation ([place])   . Bladder stone   . Dysrhythmia   . GERD (gastroesophageal reflux disease)   . Hypertension     Surgical History: Past Surgical History:  Procedure Laterality Date  . CATARACT EXTRACTION    . CORONARY STENT INTERVENTION N/A 04/20/2019   Procedure: CORONARY STENT INTERVENTION;  Surgeon: Yolonda Kida, MD;  Location: [facility];  Service: Cardiovascular;  Laterality: N/A;  . HIP SURGERY    . JOINT REPLACEMENT Right 2012   hip right  . LEFT HEART CATH AND CORONARY ANGIOGRAPHY Left 04/20/2019   Procedure: LEFT HEART CATH AND CORONARY ANGIOGRAPHY;  Surgeon: Dionisio David, MD;  Location: [facility];  Service: Cardiovascular;  Laterality: Left;     Family History: History reviewed. No pertinent family history.  Social History:  reports that he has been smoking cigarettes. He has been smoking about 0.50 packs per day. He has never used smokeless tobacco. He reports current alcohol use. He reports that he does not use drugs.  ROS: Please see flowsheet from today's date for complete review of systems.  Physical Exam: BP 115/77   Pulse 67   Temp 97.8 F (36.6 Roger) (Tympanic)   Resp 14   Ht 5\' 8"  (1.727 m)   Wt 90 kg   SpO2 100%   BMI 30.17 kg/m     Constitutional:  Alert and oriented, No acute distress. Cardiovascular: Irregularly irregular Respiratory: Clear to auscultation bilaterally GI: Abdomen is soft, nontender, nondistended, no abdominal masses GU: Foley with clear yellow urine Lymph: No cervical or inguinal lymphadenopathy. Skin: No rashes, bruises or suspicious lesions. Neurologic: Grossly intact, no focal deficits, moving all 4 extremities. Psychiatric: Normal mood and affect.  Laboratory Data: Urine culture 10/22 no growth  Pertinent Imaging: Prostate measured 70 g on CT, 1 cm bladder stone  Assessment & Plan:   The patient is a 73 year old male with cardiac history and Foley dependent urinary retention with a 70 g prostate on recent CT and a 1 cm bladder stone here today for HOLEP and cystolitholapaxy.  We discussed the risks and benefits of HOLEP at length.  The procedure requires general anesthesia and takes 2 to 3 hours, and a holmium laser is used to enucleate the prostate and push this tissue into the bladder.  A morcellator is then used to remove this tissue, which is sent for pathology.  Majority of patients are able to discharge the same day with a catheter in place for 2 to 3 days, and will follow-up in clinic for a voiding trial.  Approximately 10% of patients will be admitted overnight to monitor the urine, or if they have multiple comorbidities.  We specifically discussed the risks of bleeding, infection, retrograde ejaculation, temporary urgency and urge incontinence, very low risk of  long-term incontinence, and possible need for additional procedures.  Proceed with HOLEP and cystolitholapaxy today  Roger [name] Roger Zaria Taha, MD  [facility] [address], [address] , [place] [address] [phone]   

## 2019-10-06 NOTE — Anesthesia Post-op Follow-up Note (Signed)
Anesthesia QCDR form completed.        

## 2019-10-06 NOTE — Anesthesia Postprocedure Evaluation (Signed)
Anesthesia Post Note  Patient: Roger [name]  Procedure(s) Performed: CYSTOSCOPY WITH LITHOLAPAXY (N/A Bladder) HOLEP-LASER ENUCLEATION OF THE PROSTATE WITH MORCELLATION (N/A Prostate)  Patient location during evaluation: PACU Anesthesia Type: General Level of consciousness: awake and alert Pain management: pain level controlled Vital Signs Assessment: post-procedure vital signs reviewed and stable Respiratory status: spontaneous breathing, nonlabored ventilation, respiratory function stable and patient connected to nasal cannula oxygen Cardiovascular status: blood pressure returned to baseline and stable Postop Assessment: no apparent nausea or vomiting Anesthetic complications: no     Last Vitals:  Vitals:   10/06/19 1003 10/06/19 1017  BP:  131/75  Pulse: 72 81  Resp: (!) 23 18  Temp: 36.5 C 36.6 C  SpO2: 96% 97%    Last Pain:  Vitals:   10/06/19 1017  TempSrc: Temporal  PainSc: 3                  Martha Clan

## 2019-10-07 ENCOUNTER — Encounter: Payer: Self-pay | Admitting: Urology

## 2019-10-09 ENCOUNTER — Telehealth: Payer: Self-pay | Admitting: Urology

## 2019-10-09 ENCOUNTER — Other Ambulatory Visit: Payer: Self-pay | Admitting: Urology

## 2019-10-09 LAB — SURGICAL PATHOLOGY

## 2019-10-09 NOTE — Telephone Encounter (Signed)
Patient has an appt tomorrow . Don't take Eloquis  Until we tell him different at appt.

## 2019-10-09 NOTE — Telephone Encounter (Signed)
Pt called and states that his foley is oozing blood and wants to know if he should restart Eloquis or wait.

## 2019-10-10 ENCOUNTER — Other Ambulatory Visit: Payer: Self-pay

## 2019-10-10 ENCOUNTER — Ambulatory Visit: Payer: Medicare Other | Admitting: Physician Assistant

## 2019-10-10 ENCOUNTER — Ambulatory Visit (INDEPENDENT_AMBULATORY_CARE_PROVIDER_SITE_OTHER): Payer: Medicare Other | Admitting: Physician Assistant

## 2019-10-10 ENCOUNTER — Encounter: Payer: Self-pay | Admitting: Physician Assistant

## 2019-10-10 VITALS — BP 128/72 | HR 80 | Ht 68.0 in | Wt 198.0 lb

## 2019-10-10 DIAGNOSIS — N138 Other obstructive and reflux uropathy: Secondary | ICD-10-CM

## 2019-10-10 DIAGNOSIS — N401 Enlarged prostate with lower urinary tract symptoms: Secondary | ICD-10-CM

## 2019-10-10 LAB — BLADDER SCAN AMB NON-IMAGING: Scan Result: 0 mL

## 2019-10-10 NOTE — Progress Notes (Signed)
Catheter Removal  Patient is present today for a catheter removal.  36ml of water was drained from the balloon. A 24FR 3-way Foley cath was removed from the bladder no complications were noted . Patient tolerated well.  Performed by: Debroah Loop, PA-C   Additional notes: AM urine clear and pale pink with crusted blood around the meatus. No clots visualized. Patient to return this afternoon for PVR and to discuss color and consistency of urine before being directed to restart Eliquis. Patient informed of negative surgical pathology. He expressed understanding.  Results for orders placed or performed in visit on 10/10/19  BLADDER SCAN AMB NON-IMAGING  Result Value Ref Range   Scan Result 0 mL    PM Follow up: PVR 28mL this afternoon. Patient voided twice at home and reports clear urine. OK to restart Eliquis today.  I counseled the patient on signs and symptoms of hematuria requiring immediate evaluation, including maroon-colored urine and thick, ketchup-like urine. I advised him to contact the office for assistance if he develops these symptoms during routine office hours, 8 AM to 5 PM Monday through Friday.  If outside those hours, I advised him to proceed to the emergency department. He expressed understanding.

## 2019-10-11 ENCOUNTER — Telehealth: Payer: Self-pay | Admitting: Urology

## 2019-10-11 NOTE — Telephone Encounter (Signed)
I just spoke with the patient via telephone.  I explained that urinary frequency is common following HOLEP, as it will take several weeks for his bladder to adjust to the sudden absence of an outflow obstruction.  He reports restarting Eliquis yesterday with only one episode of pale pink urine, otherwise urine has been clear and yellow.  Additionally, he notes he has been having some sinus problems with a cough that he attributes to postnasal drip.  He is curious if his recent HOLEP may have weakened his immunity and caused an infection or interacted with his other medications to cause this.  I advised him to follow-up with his PCP with concerns for upper respiratory infections given the COVID-19 pandemic.  I explained that I do not believe his HOLEP played a role in his sinus symptoms and that his other medications remain safe for him to take.  He expressed understanding.

## 2019-10-11 NOTE — Telephone Encounter (Signed)
Pt called and wants to know if it is normal "to have to urinate every 10 mins after surgery" also are there any meds that conflict with the medicine he is on right now?

## 2019-10-12 ENCOUNTER — Other Ambulatory Visit: Payer: Self-pay

## 2019-10-12 ENCOUNTER — Encounter: Payer: Self-pay | Admitting: Emergency Medicine

## 2019-10-12 ENCOUNTER — Emergency Department
Admission: EM | Admit: 2019-10-12 | Discharge: 2019-10-12 | Disposition: A | Discharge status: Home / Self Care | Payer: Medicare Other | Attending: Emergency Medicine | Admitting: Emergency Medicine

## 2019-10-12 DIAGNOSIS — R319 Hematuria, unspecified: Secondary | ICD-10-CM | POA: Diagnosis present

## 2019-10-12 DIAGNOSIS — I10 Essential (primary) hypertension: Secondary | ICD-10-CM | POA: Insufficient documentation

## 2019-10-12 DIAGNOSIS — F17210 Nicotine dependence, cigarettes, uncomplicated: Secondary | ICD-10-CM | POA: Insufficient documentation

## 2019-10-12 DIAGNOSIS — Z7901 Long term (current) use of anticoagulants: Secondary | ICD-10-CM | POA: Diagnosis not present

## 2019-10-12 DIAGNOSIS — Z96641 Presence of right artificial hip joint: Secondary | ICD-10-CM | POA: Insufficient documentation

## 2019-10-12 DIAGNOSIS — Z79899 Other long term (current) drug therapy: Secondary | ICD-10-CM | POA: Diagnosis not present

## 2019-10-12 LAB — URINALYSIS, COMPLETE (UACMP) WITH MICROSCOPIC
Bacteria, UA: NONE SEEN
RBC / HPF: >50 RBC/hpf — ABNORMAL HIGH (ref 0–5)
Specific Gravity, Urine: 1.017 (ref 1.005–1.030)
Squamous Epithelial / HPF: NONE SEEN (ref 0–5)
WBC, UA: >50 WBC/hpf — ABNORMAL HIGH (ref 0–5)

## 2019-10-12 LAB — CBC WITH DIFFERENTIAL/PLATELET
Abs Immature Granulocytes: 0.13 10*3/uL — ABNORMAL HIGH (ref 0.00–0.07)
Basophils Absolute: 0.1 10*3/uL (ref 0.0–0.1)
Basophils Relative: 1 %
Eosinophils Absolute: 0.3 10*3/uL (ref 0.0–0.5)
Eosinophils Relative: 2 %
HCT: 40.9 % (ref 39.0–52.0)
Hemoglobin: 12.8 g/dL — ABNORMAL LOW (ref 13.0–17.0)
Immature Granulocytes: 1 %
Lymphocytes Relative: 13 %
Lymphs Abs: 1.6 10*3/uL (ref 0.7–4.0)
MCH: 29.6 pg (ref 26.0–34.0)
MCHC: 31.3 g/dL (ref 30.0–36.0)
MCV: 94.5 fL (ref 80.0–100.0)
Monocytes Absolute: 0.8 10*3/uL (ref 0.1–1.0)
Monocytes Relative: 6 %
Neutro Abs: 9.8 10*3/uL — ABNORMAL HIGH (ref 1.7–7.7)
Neutrophils Relative %: 77 %
Platelets: 301 10*3/uL (ref 150–400)
RBC: 4.33 MIL/uL (ref 4.22–5.81)
RDW: 15.7 % — ABNORMAL HIGH (ref 11.5–15.5)
WBC: 12.6 10*3/uL — ABNORMAL HIGH (ref 4.0–10.5)
nRBC: 0 % (ref 0.0–0.2)

## 2019-10-12 LAB — COMPREHENSIVE METABOLIC PANEL
ALT: 15 U/L (ref 0–44)
AST: 12 U/L — ABNORMAL LOW (ref 15–41)
Albumin: 3.5 g/dL (ref 3.5–5.0)
Alkaline Phosphatase: 77 U/L (ref 38–126)
Anion gap: 10 (ref 5–15)
BUN: 22 mg/dL (ref 8–23)
CO2: 28 mmol/L (ref 22–32)
Calcium: 8.9 mg/dL (ref 8.9–10.3)
Chloride: 101 mmol/L (ref 98–111)
Creatinine, Ser: 0.98 mg/dL (ref 0.61–1.24)
GFR calc Af Amer: >60 mL/min (ref >60)
GFR calc non Af Amer: >60 mL/min (ref >60)
Glucose, Bld: 118 mg/dL — ABNORMAL HIGH (ref 70–99)
Potassium: 3.8 mmol/L (ref 3.5–5.1)
Sodium: 139 mmol/L (ref 135–145)
Total Bilirubin: 1.3 mg/dL — ABNORMAL HIGH (ref 0.3–1.2)
Total Protein: 6.5 g/dL (ref 6.5–8.1)

## 2019-10-12 NOTE — ED Notes (Signed)
Patient c/o hematuria. No pain. During assessment patient let RN know he put dirty brief in toilet. Brief pulled out of toilet and placed in trash can. Patient comfortable and wanting to sit on bench in room. Patient given tv remote

## 2019-10-12 NOTE — Discharge Instructions (Addendum)
As we discussed please discontinue your Eliquis/blood thinner through Sunday, restarted on Monday.  Please follow-up with urology they will call you tomorrow to check in.  Return to the emergency department for any significant increase in bleeding, any lightheadedness, dizziness, or any other symptom personally concerning to yourself.

## 2019-10-12 NOTE — ED Provider Notes (Signed)
[facility] Emergency Department Provider Note  Time seen: 8:06 AM  I have reviewed the triage vital signs and the nursing notes.   HISTORY  Chief Complaint Hematuria   HPI Roger PesaRobert E [name] is a 73 y.o. male with a past medical history of anemia, atrial fibrillation on Eliquis, gastric reflux, hypertension presents to the emergency department for hematuria.  According to the patient he had his Foley catheter removed 2 days ago, states yesterday he had a very small clot come out with pink/red urine.  He states this morning he had a larger clot come out and the urine appeared more red so he came to the emergency department for evaluation.  Denies any difficulty urinating.  Denies any pain with urination.  Denies abdominal pain.  Largely negative review of systems.   Past Medical History:  Diagnosis Date  . Anemia   . Arthritis   . Atrial fibrillation ([facility])   . Bladder stone   . Dysrhythmia   . GERD (gastroesophageal reflux disease)   . Hypertension     Patient Active Problem List   Diagnosis Date Noted  . Hypotension 09/01/2019  . Ventricular fibrillation ([facility]) 04/19/2019    Past Surgical History:  Procedure Laterality Date  . CATARACT EXTRACTION    . CORONARY STENT INTERVENTION N/A 04/20/2019   Procedure: CORONARY STENT INTERVENTION;  Surgeon: Alwyn Peaallwood, Dwayne D, MD;  Location: [facility];  Service: Cardiovascular;  Laterality: N/A;  . CYSTOSCOPY WITH LITHOLAPAXY N/A 10/06/2019   Procedure: CYSTOSCOPY WITH LITHOLAPAXY;  Surgeon: Sondra ComeSninsky, Brian C, MD;  Location: [facility];  Service: Urology;  Laterality: N/A;  . HIP SURGERY    . HOLEP-LASER ENUCLEATION OF THE PROSTATE WITH MORCELLATION N/A 10/06/2019   Procedure: HOLEP-LASER ENUCLEATION OF THE PROSTATE WITH MORCELLATION;  Surgeon: Sondra ComeSninsky, Brian C, MD;  Location: [facility];  Service: Urology;  Laterality: N/A;  . JOINT REPLACEMENT Right 2012   hip right  . LEFT HEART CATH AND CORONARY ANGIOGRAPHY  Left 04/20/2019   Procedure: LEFT HEART CATH AND CORONARY ANGIOGRAPHY;  Surgeon: Laurier NancyKhan, Shaukat A, MD;  Location: [facility];  Service: Cardiovascular;  Laterality: Left;    Prior to Admission medications   Medication Sig Start Date End Date Taking? Authorizing Provider  apixaban (ELIQUIS) 2.5 MG TABS tablet Take 2.5 mg by mouth 2 (two) times daily.    [provider]  atorvastatin (LIPITOR) 10 MG tablet Take 10 mg by mouth daily. 05/22/19   [provider]  Cholecalciferol (VITAMIN D3) 1.25 MG (50000 UT) CAPS Take 50,000 Units by mouth every Thursday.  05/02/19   [provider]  ezetimibe (ZETIA) 10 MG tablet Take 10 mg by mouth daily. 08/13/19   [provider]  fluticasone (FLONASE) 50 MCG/ACT nasal spray Place 2 sprays into the nose daily as needed for allergies.  04/08/19 04/07/20  [provider]  furosemide (LASIX) 20 MG tablet Take 20 mg by mouth daily. 09/12/19   [provider]  ketotifen (ZADITOR) 0.025 % ophthalmic solution Place 1 drop into both eyes daily as needed (allergies).    [provider]  metoprolol tartrate (LOPRESSOR) 25 MG tablet Take 25 mg by mouth 2 (two) times daily.    [provider]  pantoprazole (PROTONIX) 40 MG tablet Take 1 tablet (40 mg total) by mouth daily. Patient taking differently: Take 40 mg by mouth daily as needed (acid reflux).  04/21/19   Adrian SaranMody, Sital, MD  spironolactone (ALDACTONE) 25 MG tablet Take 25  mg by mouth daily. 08/13/19   [provider]  lisinopril (PRINIVIL,ZESTRIL) 10 MG tablet Take 5 mg by mouth daily.   06/10/19  [provider]    Allergies  Allergen Reactions  . Amoxicillin Anaphylaxis    Did it involve swelling of the face/tongue/throat, SOB, or low BP? Yes Did it involve sudden or severe rash/hives, skin peeling, or any reaction on the inside of your mouth or nose? No Did you need to seek medical attention at a hospital or doctor's office?  No When did it last happen?10+ years If all above answers are "NO", may proceed with cephalosporin use.   . Sulfa Antibiotics Anaphylaxis  . Pravastatin Other (See Comments)    Irritable bowel  . Rosuvastatin Other (See Comments)    Irritable bowel    No family history on file.  Social History Social History   Tobacco Use  . Smoking status: Current Every Day Smoker    Packs/day: 0.50    Types: Cigarettes  . Smokeless tobacco: Never Used  Substance Use Topics  . Alcohol use: Yes    Comment: rare  . Drug use: No    Review of Systems Constitutional: Negative for fever. Cardiovascular: Negative for chest pain. Respiratory: Negative for shortness of breath. Gastrointestinal: Negative for abdominal pain Genitourinary: Positive for hematuria Musculoskeletal: Negative for musculoskeletal complaints Neurological: Negative for headache All other ROS negative  ____________________________________________   PHYSICAL EXAM:  VITAL SIGNS: ED Triage Vitals  Enc Vitals Group     BP 10/12/19 0541 138/79     Pulse Rate 10/12/19 0541 77     Resp 10/12/19 0541 17     Temp 10/12/19 0541 97.8 F (36.6 C)     Temp Source 10/12/19 0541 Oral     SpO2 10/12/19 0541 100 %     Weight 10/12/19 0539 197 lb 12 oz (89.7 kg)     Height 10/12/19 0539 5\' 8"  (1.727 m)     Head Circumference --      Peak Flow --      Pain Score 10/12/19 0539 0     Pain Loc --      Pain Edu? --      Excl. in [place]? --    Constitutional: Alert and oriented. Well appearing and in no distress. Eyes: Normal exam ENT      Head: Normocephalic and atraumatic.      Mouth/Throat: Mucous membranes are moist. Cardiovascular: Normal rate, regular rhythm.  Respiratory: Normal respiratory effort without tachypnea nor retractions. Breath sounds are clear Gastrointestinal: Soft and nontender. No distention.   Musculoskeletal: Nontender with normal range of motion in all extremities. Neurologic:  Normal speech and  language. No gross focal neurologic deficits  Skin:  Skin is warm, dry and intact.  Psychiatric: Mood and affect are normal.  ____________________________________________  INITIAL IMPRESSION / ASSESSMENT AND PLAN / ED COURSE  Pertinent labs & imaging results that were available during my care of the patient were reviewed by me and considered in my medical decision making (see chart for details).   Patient presents emergency department for continued hematuria.  Had his catheter removed 2 days ago, restarted Eliquis yesterday after being off of it x1 week.  Patient has no abdominal pain or tenderness.  States he was most concerned because a large clot came out this morning.  Patient is somewhat anxious appearing.  Patient states he was told to return to urology or the emergency department if he had increased bleeding, states he  called urology yesterday about something else so he came to the emergency department today as to not bother them.  Overall the patient appears well, no distress, reassuring physical exam.  Patient does have red urine.  But no sign of active infection with no bacteria on microscopy we will send a culture as a precaution.  Patient's hemoglobin is actually increased from his last value.  I will discuss with Dr. Richardo Hanks is the patient's urologist, and help arrange for follow-up.  Discussed the patient with urology, they recommend discontinue the patient's Eliquis through Sunday have him restart on Monday increase fluids.  Patient agreeable to plan of care will follow-up with urology.  They will call the patient tomorrow at home.   Roger [name] was evaluated in Emergency Department on 10/12/2019 for the symptoms described in the history of present illness. He was evaluated in the context of the global COVID-19 pandemic, which necessitated consideration that the patient might be at risk for infection with the SARS-CoV-2 virus that causes COVID-19. Institutional protocols and  algorithms that pertain to the evaluation of patients at risk for COVID-19 are in a state of rapid change based on information released by regulatory bodies including the CDC and federal and state organizations. These policies and algorithms were followed during the patient's care in the ED.  ____________________________________________   FINAL CLINICAL IMPRESSION(S) / ED DIAGNOSES  Hematuria   Minna Antis, MD 10/12/19 1009

## 2019-10-12 NOTE — ED Notes (Signed)
Patient given water cup 

## 2019-10-12 NOTE — ED Triage Notes (Signed)
Patient ambulatory to triage with steady gait, without difficulty or distress noted, mask in place; pt st cath removed on Tuesday; st has had hematuria since; denies pain

## 2019-10-15 LAB — URINE CULTURE: Culture: >=100000 — AB

## 2019-10-16 ENCOUNTER — Encounter: Payer: Self-pay | Admitting: Emergency Medicine

## 2019-10-16 ENCOUNTER — Emergency Department: Payer: Medicare Other

## 2019-10-16 ENCOUNTER — Emergency Department
Admission: EM | Admit: 2019-10-16 | Discharge: 2019-10-16 | Disposition: A | Discharge status: Home / Self Care | Payer: Medicare Other | Attending: Student in an Organized Health Care Education/Training Program | Admitting: Student in an Organized Health Care Education/Training Program

## 2019-10-16 ENCOUNTER — Other Ambulatory Visit: Payer: Self-pay

## 2019-10-16 DIAGNOSIS — F17210 Nicotine dependence, cigarettes, uncomplicated: Secondary | ICD-10-CM | POA: Diagnosis not present

## 2019-10-16 DIAGNOSIS — I10 Essential (primary) hypertension: Secondary | ICD-10-CM | POA: Insufficient documentation

## 2019-10-16 DIAGNOSIS — Y9389 Activity, other specified: Secondary | ICD-10-CM | POA: Diagnosis not present

## 2019-10-16 DIAGNOSIS — W010XXA Fall on same level from slipping, tripping and stumbling without subsequent striking against object, initial encounter: Secondary | ICD-10-CM | POA: Insufficient documentation

## 2019-10-16 DIAGNOSIS — Z79899 Other long term (current) drug therapy: Secondary | ICD-10-CM | POA: Diagnosis not present

## 2019-10-16 DIAGNOSIS — Y999 Unspecified external cause status: Secondary | ICD-10-CM | POA: Insufficient documentation

## 2019-10-16 DIAGNOSIS — S8001XA Contusion of right knee, initial encounter: Secondary | ICD-10-CM

## 2019-10-16 DIAGNOSIS — S8991XA Unspecified injury of right lower leg, initial encounter: Secondary | ICD-10-CM | POA: Diagnosis present

## 2019-10-16 DIAGNOSIS — Y929 Unspecified place or not applicable: Secondary | ICD-10-CM | POA: Insufficient documentation

## 2019-10-16 NOTE — ED Triage Notes (Signed)
States caught foot and fell landing on R knee approx 1 hour ago. Reddened knee.

## 2019-10-16 NOTE — Progress Notes (Signed)
Brief Pharmacy Note  Patient is a 73 y/o M who presented to [facility] ED on 11/5 c/o hematuria. At that time he denied urinary symptoms, abdominal pain. Recent foley catheter removal on 11/3. Patient recently underwent HOLEP and cystolitholapaxy on 10/30 for enlarged prostate and bladder stone. Urine culture from ED visit has resulted >100k colonies/mL Corynebacterium spp. (no species identification or antimicrobial susceptibility reported). Telephoned [facility] laboratory to inquire about availability of further species identification and susceptibility testing. Informed that this would require a send-out culture with a turn-around-time of 1-2 weeks.   Concern for possible Corynebacterium urealyticum infection considering bladder stone and calcium oxalate crystals on 10/22 UA. However, un-clear if culture result is asymptomatic bacteriuria vs infection. Relayed results of culture to urology PA. If empiric treatment of Corynebacterium urealyticum was pursued recommendation was for linezolid due to MDR characteristics of this species.   Spoke with patient via telephone and he reports that he has a f/u visit with Dr. Diamantina Providence in clinic tomorrow, 11/10. Endorses some hematuria and urinary frequency in presence of increased fluid intake. Denies fevers and dysuria. Based on this information, I will defer decision for treatment to urologist after patient is further evaluated in clinic tomorrow.  [place] Resident 16 October 2019

## 2019-10-16 NOTE — Discharge Instructions (Addendum)
Please take Tylenol as needed for pain.  Use knee immobilizer as needed for comfort.  Ice knee 20 minutes every hour for the next 2 to 3 days.  Follow-up with orthopedics if no improvement 1 week.

## 2019-10-16 NOTE — ED Notes (Signed)
Pt went to the bathroom and requested I look at his urine. Frank blood noted. Pt states he had "bladder surgery" on the 30th to remove a stone.

## 2019-10-16 NOTE — ED Notes (Signed)
Knee immobilizer not applied. PT states he has one at home. PA aware.

## 2019-10-16 NOTE — ED Provider Notes (Addendum)
[facility] EMERGENCY DEPARTMENT Provider Note   CSN: [id number] Arrival date & time: 10/16/19  1721     History   Chief Complaint Chief Complaint  Patient presents with  . Knee Pain    HPI Roger [name] is a 73 y.o. male.  Presents the emergency department for evaluation of a medical clinical fall earlier today.  Patient tripped, fell onto his right knee.  Has been able to stand and ambulate.  Able straight leg raise.  Patient denies any other injury to his body or pain.  No head injury, neck pain, upper extremity or hip pain.  He has not any medications for pain.  Pain is mild mostly along the medial aspect of the knee and along the patellar tendon.  Patient also mentions he is having some blood in his urine, no abdominal pain, back pain.  States he had a recent cystoscopy for stone removal and for prostate hypertrophy.  Has had some intermittent blood over the last 10 days since his procedure.  Again no fevers, abdominal pain or back pain.  He follows up with urology tomorrow.      HPI  Past Medical History:  Diagnosis Date  . Anemia   . Arthritis   . Atrial fibrillation (HCC)   . Bladder stone   . Dysrhythmia   . GERD (gastroesophageal reflux disease)   . Hypertension     Patient Active Problem List   Diagnosis Date Noted  . Hypotension 09/01/2019  . Ventricular fibrillation (HCC) 04/19/2019    Past Surgical History:  Procedure Laterality Date  . CATARACT EXTRACTION    . CORONARY STENT INTERVENTION N/A 04/20/2019   Procedure: CORONARY STENT INTERVENTION;  Surgeon: Alwyn Pea, MD;  Location: [facility] INVASIVE CV LAB;  Service: Cardiovascular;  Laterality: N/A;  . CYSTOSCOPY WITH LITHOLAPAXY N/A 10/06/2019   Procedure: CYSTOSCOPY WITH LITHOLAPAXY;  Surgeon: Sondra Come, MD;  Location: [facility] ORS;  Service: Urology;  Laterality: N/A;  . HIP SURGERY    . HOLEP-LASER ENUCLEATION OF THE PROSTATE WITH MORCELLATION N/A 10/06/2019   Procedure: HOLEP-LASER ENUCLEATION OF THE PROSTATE WITH MORCELLATION;  Surgeon: Sondra Come, MD;  Location: [facility] ORS;  Service: Urology;  Laterality: N/A;  . JOINT REPLACEMENT Right 2012   hip right  . LEFT HEART CATH AND CORONARY ANGIOGRAPHY Left 04/20/2019   Procedure: LEFT HEART CATH AND CORONARY ANGIOGRAPHY;  Surgeon: Laurier Nancy, MD;  Location: [facility] INV[facility] CV LAB;  Service: Cardiovascular;  Laterality: Left;        Home Medications    Prior to Admission medications   Medication Sig Start Date End Date Taking? Authorizing Provider  apixaban (ELIQUIS) 2.5 MG TABS tablet Take 2.5 mg by mouth 2 (two) times daily.    [provider]  atorvastatin (LIPITOR) 10 MG tablet Take 10 mg by mouth daily. 05/22/19   [provider]  Cholecalciferol (VITAMIN D3) 1.25 MG (50000 UT) CAPS Take 50,000 Units by mouth every Thursday.  05/02/19   [provider]  ezetimibe (ZETIA) 10 MG tablet Take 10 mg by mouth daily. 08/13/19   [provider]  fluticasone (FLONASE) 50 MCG/ACT nasal spray Place 2 sprays into the nose daily as needed for allergies.  04/08/19 04/07/20  [provider]  furosemide (LASIX) 20 MG tablet Take 20 mg by mouth daily. 09/12/19   [provider]  ketotifen (ZADITOR) 0.025 % ophthalmic solution Place 1 drop into both eyes daily as needed (allergies).    [provider]  metoprolol tartrate (LOPRESSOR) 25 MG tablet Take 25 mg by mouth 2 (two) times daily.    [provider]  pantoprazole (PROTONIX) 40 MG tablet Take 1 tablet (40 mg total) by mouth daily. Patient taking differently: Take 40 mg by mouth daily as needed (acid reflux).  04/21/19   Adrian SaranMody, Sital, MD  spironolactone (ALDACTONE) 25 MG tablet Take 25 mg by mouth daily. 08/13/19   [provider]  lisinopril (PRINIVIL,ZESTRIL) 10 MG tablet Take 5 mg by mouth daily.   06/10/19  [provider]    Family History No family history on file.   Social History Social History   Tobacco Use  . Smoking status: Current Every Day Smoker    Packs/day: 0.50    Types: Cigarettes  . Smokeless tobacco: Never Used  Substance Use Topics  . Alcohol use: Yes    Comment: rare  . Drug use: No     Allergies   Amoxicillin, Sulfa antibiotics, Pravastatin, and Rosuvastatin   Review of Systems Review of Systems  Constitutional: Negative for fever.  Respiratory: Negative for shortness of breath.   Cardiovascular: Negative for chest pain.  Gastrointestinal: Negative for abdominal pain.  Genitourinary: Negative for difficulty urinating, dysuria and urgency.  Musculoskeletal: Positive for arthralgias. Negative for back pain, gait problem, joint swelling, myalgias and neck pain.  Skin: Negative for rash.  Neurological: Negative for dizziness and headaches.     Physical Exam Updated Vital Signs BP (!) 151/71   Pulse 86   Temp 97.9 F (36.6 C) (Oral)   Resp 20   Ht 5\' 8"  (1.727 m)   Wt 91.2 kg   SpO2 97%   BMI 30.56 kg/m   Physical Exam Constitutional:      Appearance: He is well-developed.  HENT:     Head: Normocephalic and atraumatic.  Eyes:     Conjunctiva/sclera: Conjunctivae normal.  Neck:     Musculoskeletal: Normal range of motion.  Cardiovascular:     Rate and Rhythm: Normal rate.  Pulmonary:     Effort: Pulmonary effort is normal. No respiratory distress.  Abdominal:     General: There is no distension.     Tenderness: There is no abdominal tenderness.  Musculoskeletal:     Comments: Patient ambulatory, no antalgic gait.  No assistive devices.  No swelling throughout the right knee.  No ligamentous laxity.  He is able to straight leg raise.  No skin breakdown noted.  No defect in the quads or patella tendon or patella.  Skin:    General: Skin is warm.     Findings: No rash.  Neurological:     Mental Status: He is alert and oriented to person, place, and time.  Psychiatric:        Behavior: Behavior normal.         Thought Content: Thought content normal.      ED Treatments / Results  Labs (all labs ordered are listed, but only abnormal results are displayed) Labs Reviewed - No data to display  EKG None  Radiology Dg Knee Complete 4 Views Right  Result Date: 10/16/2019 CLINICAL DATA:  S/p fall, landing on 1 knee an hour ago with pain. EXAM: RIGHT KNEE - COMPLETE 4+ VIEW COMPARISON:  None. FINDINGS: Mild medial joint space narrowing. Also with degenerative changes in the patellofemoral joint. No signs of acute fracture or dislocation. Incidental note made of vascular disease with calcified atherosclerosis and femoral and visualized popliteal artery. IMPRESSION: Degenerative changes without  signs of fracture. Electronically Signed   By: Zetta Bills M.D.   On: 10/16/2019 19:11    Procedures Procedures (including critical care time)  Medications Ordered in ED Medications - No data to display   Initial Impression / Assessment and Plan / ED Course  I have reviewed the triage vital signs and the nursing notes.  Pertinent labs & imaging results that were available during my care of the patient were reviewed by me and considered in my medical decision making (see chart for details).        73 year old male with right knee pain from a fall earlier today.  X-rays negative for any acute bony normality.  No sign of ligamentous or tendon injury.  He is ambulatory no assistive devices nonantalgic gait.  He will take Tylenol as needed for pain, rest ice and elevate the knee.  He is given a knee immobilizer to help with comfort.    Final Clinical Impressions(s) / ED Diagnoses   Final diagnoses:  Contusion of right knee, initial encounter    ED Discharge Orders    None       Renata Caprice 10/16/19 2039    Duanne Guess, PA-C 10/16/19 2040    Merlyn Lot, MD 10/16/19 2144

## 2019-10-17 ENCOUNTER — Other Ambulatory Visit: Payer: Self-pay

## 2019-10-17 ENCOUNTER — Encounter: Payer: Self-pay | Admitting: Urology

## 2019-10-17 ENCOUNTER — Ambulatory Visit (INDEPENDENT_AMBULATORY_CARE_PROVIDER_SITE_OTHER): Payer: Medicare Other | Admitting: Physician Assistant

## 2019-10-17 VITALS — BP 140/80 | HR 82 | Ht 68.0 in | Wt 201.0 lb

## 2019-10-17 DIAGNOSIS — N3001 Acute cystitis with hematuria: Secondary | ICD-10-CM

## 2019-10-17 DIAGNOSIS — R310 Gross hematuria: Secondary | ICD-10-CM

## 2019-10-17 LAB — URINALYSIS, COMPLETE
Bilirubin, UA: NEGATIVE
Glucose, UA: NEGATIVE
Ketones, UA: NEGATIVE
Nitrite, UA: NEGATIVE
Protein,UA: NEGATIVE
Specific Gravity, UA: 1.015 (ref 1.005–1.030)
Urobilinogen, Ur: 0.2 mg/dL (ref 0.2–1.0)
pH, UA: 5.5 (ref 5.0–7.5)

## 2019-10-17 LAB — MICROSCOPIC EXAMINATION
Epithelial Cells (non renal): NONE SEEN /hpf (ref 0–10)
RBC, Urine: >30 /hpf — AB (ref 0–2)

## 2019-10-17 LAB — BLADDER SCAN AMB NON-IMAGING: Scan Result: 0

## 2019-10-17 MED ORDER — CIPROFLOXACIN HCL 500 MG PO TABS: 500.0000 mg | ORAL_TABLET | Freq: Two times a day (BID) | ORAL | 0 refills | Status: AC

## 2019-10-17 NOTE — Progress Notes (Signed)
10/17/2019 8:19 AM   Roger [name] 1946-03-16 [id number]  CC: [name] hematuria  HPI: Roger [name] is a 73 y.o. male who presents today for evaluation of gross hematuria. He is POD11 from [facility] with cystolitholapaxy with Dr. Richardo Hanks.  He is on Eliquis at baseline, held this in advance of surgery and was counseled to restart it on POD4 following catheter removal.  He sought care in the emergency department on POD6 with gross hematuria.  He was counseled to again discontinue Eliquis at that time with plans to restart it yesterday (POD10) and push fluids in the interim.  Today, he reports having restarted Eliquis yesterday but skipping his morning dose this AM. He reports intermittent gross hematuria with urine as dark in color as cherry red, always thin and runny. He has been pushing fluids. He reports some mild dysuria and urinary frequency.  Of note, urine culture from his ED visit on POD6 returned with >100K colonies/mL Corynebacterium species, susceptibility testing not performed.  In-office UA today positive for 3+ blood and 1+ leukocyte esterase; urine microscopy with 6-10 WBCs/HPF and >30 RBCs/HPF. PVR 19mL.  PMH: Past Medical History:  Diagnosis Date  . Anemia   . Arthritis   . Atrial fibrillation (HCC)   . Bladder stone   . Dysrhythmia   . GERD (gastroesophageal reflux disease)   . Hypertension     Surgical History: Past Surgical History:  Procedure Laterality Date  . CATARACT EXTRACTION    . CORONARY STENT INTERVENTION N/A 04/20/2019   Procedure: CORONARY STENT INTERVENTION;  Surgeon: Alwyn Pea, MD;  Location: [facility];  Service: Cardiovascular;  Laterality: N/A;  . CYSTOSCOPY WITH LITHOLAPAXY N/A 10/06/2019   Procedure: CYSTOSCOPY WITH LITHOLAPAXY;  Surgeon: Sondra Come, MD;  Location: [facility] ORS;  Service: Urology;  Laterality: N/A;  . HIP SURGERY    . HOLEP-LASER ENUCLEATION OF THE PROSTATE WITH MORCELLATION N/A 10/06/2019   Procedure:  HOLEP-LASER ENUCLEATION OF THE PROSTATE WITH MORCELLATION;  Surgeon: Sondra Come, MD;  Location: [facility] ORS;  Service: Urology;  Laterality: N/A;  . JOINT REPLACEMENT Right 2012   hip right  . LEFT HEART CATH AND CORONARY ANGIOGRAPHY Left 04/20/2019   Procedure: LEFT HEART CATH AND CORONARY ANGIOGRAPHY;  Surgeon: Laurier Nancy, MD;  Location: [facility];  Service: Cardiovascular;  Laterality: Left;    Home Medications:  Allergies as of 10/17/2019      Reactions   Amoxicillin Anaphylaxis   Did it involve swelling of the face/tongue/throat, SOB, or low BP? Yes Did it involve sudden or severe rash/hives, skin peeling, or any reaction on the inside of your mouth or nose? No Did you need to seek medical attention at a hospital or doctor's office? No When did it last happen?10+ years If all above answers are "NO", may proceed with cephalosporin use.   Sulfa Antibiotics Anaphylaxis   Pravastatin Other (See Comments)   Irritable bowel   Rosuvastatin Other (See Comments)   Irritable bowel      Medication List       Accurate as of October 17, 2019  8:19 AM. If you have any questions, ask your nurse or doctor.        atorvastatin 10 MG tablet Commonly known as: LIPITOR Take 10 mg by mouth daily.   Eliquis 2.5 MG Tabs tablet Generic drug: apixaban Take 2.5 mg by mouth 2 (two) times daily.   ezetimibe 10 MG tablet Commonly known as: ZETIA Take 10 mg  by mouth daily.   fluticasone 50 MCG/ACT nasal spray Commonly known as: FLONASE Place 2 sprays into the nose daily as needed for allergies.   furosemide 20 MG tablet Commonly known as: LASIX Take 20 mg by mouth daily.   ketotifen 0.025 % ophthalmic solution Commonly known as: ZADITOR Place 1 drop into both eyes daily as needed (allergies).   metoprolol tartrate 25 MG tablet Commonly known as: LOPRESSOR Take 25 mg by mouth 2 (two) times daily.   pantoprazole 40 MG tablet Commonly known as: Protonix Take 1  tablet (40 mg total) by mouth daily. What changed:   when to take this  reasons to take this   spironolactone 25 MG tablet Commonly known as: ALDACTONE Take 25 mg by mouth daily.   Vitamin D3 1.25 MG (50000 UT) Caps Take 50,000 Units by mouth every Thursday.       Allergies:  Allergies  Allergen Reactions  . Amoxicillin Anaphylaxis    Did it involve swelling of the face/tongue/throat, SOB, or low BP? Yes Did it involve sudden or severe rash/hives, skin peeling, or any reaction on the inside of your mouth or nose? No Did you need to seek medical attention at a hospital or doctor's office? No When did it last happen?10+ years If all above answers are "NO", may proceed with cephalosporin use.   . Sulfa Antibiotics Anaphylaxis  . Pravastatin Other (See Comments)    Irritable bowel  . Rosuvastatin Other (See Comments)    Irritable bowel    Family History: History reviewed. No pertinent family history.  Social History:   reports that he has been smoking cigarettes. He has been smoking about 0.50 packs per day. He has never used smokeless tobacco. He reports current alcohol use. He reports that he does not use drugs.  ROS: UROLOGY Frequent Urination?: Yes Hard to postpone urination?: No Burning/pain with urination?: No Get up at night to urinate?: Yes Leakage of urine?: No Urine stream starts and stops?: Yes Trouble starting stream?: Yes Do you have to strain to urinate?: No Blood in urine?: Yes Urinary tract infection?: No Sexually transmitted disease?: No Injury to kidneys or bladder?: No Painful intercourse?: No Weak stream?: No Erection problems?: No Penile pain?: No  Gastrointestinal Nausea?: No Vomiting?: No Indigestion/heartburn?: No Diarrhea?: No Constipation?: No  Constitutional Fever: No Night sweats?: No Weight loss?: No Fatigue?: No  Skin Skin rash/lesions?: No Itching?: No  Eyes Blurred vision?: No Double vision?: No   Ears/Nose/Throat Sore throat?: No Sinus problems?: No  Hematologic/Lymphatic Swollen glands?: No Easy bruising?: Yes  Cardiovascular Leg swelling?: No Chest pain?: No  Respiratory Cough?: No Shortness of breath?: No  Endocrine Excessive thirst?: No  Musculoskeletal Back pain?: Yes Joint pain?: Yes  Neurological Headaches?: No Dizziness?: No  Psychologic Depression?: No Anxiety?: No  Physical Exam: BP 140/80   Pulse 82   Ht 5\' 8"  (1.727 m)   Wt 201 lb (91.2 kg)   BMI 30.56 kg/m   Constitutional:  Alert and oriented, no acute distress, nontoxic appearing HEENT: , AT Cardiovascular: No clubbing, cyanosis, or edema Respiratory: Normal respiratory effort, no increased work of breathing Skin: No rashes, bruises or suspicious lesions Neurologic: Grossly intact, no focal deficits, moving all 4 extremities Psychiatric: Anxious mood and affect  Laboratory Data: Results for orders placed or performed in visit on 10/17/19  Microscopic Examination   URINE  Result Value Ref Range   WBC, UA 6-10 (A) 0 - 5 /hpf   RBC >30 (A) 0 -  2 /hpf   Epithelial Cells (non renal) None seen 0 - 10 /hpf   Bacteria, UA Few None seen/Few  Urinalysis, Complete  Result Value Ref Range   Specific Gravity, UA 1.015 1.005 - 1.030   pH, UA 5.5 5.0 - 7.5   Color, UA Red (A) Yellow   Appearance Ur Cloudy (A) Clear   Leukocytes,UA 1+ (A) Negative   Protein,UA Negative Negative/Trace   Glucose, UA Negative Negative   Ketones, UA Negative Negative   RBC, UA 3+ (A) Negative   Bilirubin, UA Negative Negative   Urobilinogen, Ur 0.2 0.2 - 1.0 mg/dL   Nitrite, UA Negative Negative   Microscopic Examination See below:   Bladder Scan (Post Void Residual) in office  Result Value Ref Range   Scan Result 0    Assessment & Plan:   1. Hematuria, gross Patient reports continued, intermittent gross hematuria following HOLEP.  He denies warning signs of hematuria including thick, ketchup-like  urine and dark, maroon urine.  I am not concerned for clot retention, particularly given his PVR of 0 mL today.  I counseled him to continue his Eliquis this evening and contact our office if he develops dark or thick urine.  I advised him that thin, cherry red urine is okay, particularly while on anticoagulants.  He expressed understanding. - Urinalysis, Complete - Bladder Scan (Post Void Residual) in office  2. Acute cystitis with hematuria Patient reports mild irritative voiding symptoms, most consistent with postoperative dysuria and frequency given recent HOLEP.  However, given positive urine culture, will treat today.  In the absence of susceptibility testing, will have patient return to clinic for lab visit in 1 week to prove resolution of infection.  Of note, UA today improved over prior UA drawn in the ED.  I suspect an element of self-limited infection contributing to this. - CULTURE, URINE COMPREHENSIVE; Future - Urinalysis, Complete; Future - ciprofloxacin (CIPRO) 500 MG tablet; Take 1 tablet (500 mg total) by mouth 2 (two) times daily for 5 days.  Dispense: 10 tablet; Refill: 0   Return in about 1 week (around 10/24/2019) for Lab visit for UA and culture.  Carman ChingSamantha Kalinda Romaniello, PA-C  [facility] [address], [address] [place], [place] [address] [phone]

## 2019-10-18 ENCOUNTER — Emergency Department
Admission: EM | Admit: 2019-10-18 | Discharge: 2019-10-18 | Disposition: A | Discharge status: Home / Self Care | Payer: Medicare Other | Attending: Emergency Medicine | Admitting: Emergency Medicine

## 2019-10-18 ENCOUNTER — Emergency Department: Payer: Medicare Other

## 2019-10-18 ENCOUNTER — Other Ambulatory Visit: Payer: Self-pay

## 2019-10-18 ENCOUNTER — Encounter: Payer: Self-pay | Admitting: Emergency Medicine

## 2019-10-18 DIAGNOSIS — F17210 Nicotine dependence, cigarettes, uncomplicated: Secondary | ICD-10-CM | POA: Diagnosis not present

## 2019-10-18 DIAGNOSIS — Z79899 Other long term (current) drug therapy: Secondary | ICD-10-CM | POA: Diagnosis not present

## 2019-10-18 DIAGNOSIS — I10 Essential (primary) hypertension: Secondary | ICD-10-CM | POA: Insufficient documentation

## 2019-10-18 DIAGNOSIS — Z7901 Long term (current) use of anticoagulants: Secondary | ICD-10-CM | POA: Insufficient documentation

## 2019-10-18 DIAGNOSIS — R519 Headache, unspecified: Secondary | ICD-10-CM | POA: Diagnosis present

## 2019-10-18 DIAGNOSIS — J0100 Acute maxillary sinusitis, unspecified: Secondary | ICD-10-CM | POA: Diagnosis not present

## 2019-10-18 NOTE — ED Triage Notes (Addendum)
Pt c/o sinus pressure behind eyes with nasal congestion. PT in NAD, VS . Denies fever

## 2019-10-18 NOTE — ED Provider Notes (Signed)
[facility] Emergency Department Provider Note  ____________________________________________  Time seen: Approximately 8:39 PM  I have reviewed the triage vital signs and the nursing notes.   HISTORY  Chief Complaint No chief complaint on file.    HPI Roger PesaRobert E [name] is a 73 y.o. male presents to the emergency department with persistent right-sided facial pain.  Patient has been taking doxycycline and Flonase for sinusitis and states he is only a few days into his medication.  Patient states that he was feeling better but noticed a return of discomfort tonight became concerned.  Patient states that he had a fall 2 to 3 days ago and wanted to make sure that "nothing was going wrong and there".  He denies dizziness, vertigo or other new symptoms.  No other alleviating measures have been attempted.         Past Medical History:  Diagnosis Date  . Anemia   . Arthritis   . Atrial fibrillation (HCC)   . Bladder stone   . Dysrhythmia   . GERD (gastroesophageal reflux disease)   . Hypertension     Patient Active Problem List   Diagnosis Date Noted  . Hypotension 09/01/2019  . Ventricular fibrillation (HCC) 04/19/2019    Past Surgical History:  Procedure Laterality Date  . CATARACT EXTRACTION    . CORONARY STENT INTERVENTION N/A 04/20/2019   Procedure: CORONARY STENT INTERVENTION;  Surgeon: Alwyn Peaallwood, Dwayne D, MD;  Location: [facility] INVASIVE CV LAB;  Service: Cardiovascular;  Laterality: N/A;  . CYSTOSCOPY WITH LITHOLAPAXY N/A 10/06/2019   Procedure: CYSTOSCOPY WITH LITHOLAPAXY;  Surgeon: Sondra ComeSninsky, Brian C, MD;  Location: [facility] ORS;  Service: Urology;  Laterality: N/A;  . HIP SURGERY    . HOLEP-LASER ENUCLEATION OF THE PROSTATE WITH MORCELLATION N/A 10/06/2019   Procedure: HOLEP-LASER ENUCLEATION OF THE PROSTATE WITH MORCELLATION;  Surgeon: Sondra ComeSninsky, Brian C, MD;  Location: [facility] ORS;  Service: Urology;  Laterality: N/A;  . JOINT REPLACEMENT Right 2012   hip  right  . LEFT HEART CATH AND CORONARY ANGIOGRAPHY Left 04/20/2019   Procedure: LEFT HEART CATH AND CORONARY ANGIOGRAPHY;  Surgeon: Laurier NancyKhan, Shaukat A, MD;  Location: [facility] INVASIVE CV LAB;  Service: Cardiovascular;  Laterality: Left;    Prior to Admission medications   Medication Sig Start Date End Date Taking? Authorizing Provider  apixaban (ELIQUIS) 2.5 MG TABS tablet Take 2.5 mg by mouth 2 (two) times daily.    [provider]  atorvastatin (LIPITOR) 10 MG tablet Take 10 mg by mouth daily. 05/22/19   [provider]  Cholecalciferol (VITAMIN D3) 1.25 MG (50000 UT) CAPS Take 50,000 Units by mouth every Thursday.  05/02/19   [provider]  ciprofloxacin (CIPRO) 500 MG tablet Take 1 tablet (500 mg total) by mouth 2 (two) times daily for 5 days. 10/17/19 10/22/19  Carman ChingVaillancourt, Samantha, PA-C  ezetimibe (ZETIA) 10 MG tablet Take 10 mg by mouth daily. 08/13/19   [provider]  fluticasone (FLONASE) 50 MCG/ACT nasal spray Place 2 sprays into the nose daily as needed for allergies.  04/08/19 04/07/20  [provider]  furosemide (LASIX) 20 MG tablet Take 20 mg by mouth daily. 09/12/19   [provider]  ketotifen (ZADITOR) 0.025 % ophthalmic solution Place 1 drop into both eyes daily as needed (allergies).    [provider]  metoprolol tartrate (LOPRESSOR) 25 MG tablet Take 25 mg by mouth 2 (two) times daily.    [provider]  pantoprazole (PROTONIX) 40 MG tablet Take 1  tablet (40 mg total) by mouth daily. Patient taking differently: Take 40 mg by mouth daily as needed (acid reflux).  04/21/19   Bettey Costa, MD  spironolactone (ALDACTONE) 25 MG tablet Take 25 mg by mouth daily. 08/13/19   [provider]  lisinopril (PRINIVIL,ZESTRIL) 10 MG tablet Take 5 mg by mouth daily.   06/10/19  [provider]    Allergies Amoxicillin, Sulfa antibiotics, Pravastatin, and Rosuvastatin  No family history on file.  Social  History Social History   Tobacco Use  . Smoking status: Current Every Day Smoker    Packs/day: 0.50    Types: Cigarettes  . Smokeless tobacco: Never Used  Substance Use Topics  . Alcohol use: Yes    Comment: rare  . Drug use: No     Review of Systems  Constitutional: Patient has facial pain. Eyes: No visual changes. No discharge ENT: No upper respiratory complaints. Cardiovascular: no chest pain. Respiratory: no cough. No SOB. Gastrointestinal: No abdominal pain.  No nausea, no vomiting.  No diarrhea.  No constipation. Genitourinary: Negative for dysuria. No hematuria Musculoskeletal: Negative for musculoskeletal pain. Skin: Negative for rash, abrasions, lacerations, ecchymosis. Neurological: Negative for headaches, focal weakness or numbness.  ____________________________________________   PHYSICAL EXAM:  VITAL SIGNS: ED Triage Vitals  Enc Vitals Group     BP 10/18/19 1746 (!) 157/76     Pulse Rate 10/18/19 1746 87     Resp 10/18/19 1746 16     Temp 10/18/19 1746 98.4 F (36.9 C)     Temp Source 10/18/19 1746 Oral     SpO2 10/18/19 1746 98 %     Weight --      Height --      Head Circumference --      Peak Flow --      Pain Score 10/18/19 1745 2     Pain Loc --      Pain Edu? --      Excl. in [place]? --      Constitutional: Alert and oriented. Well appearing and in no acute distress. Eyes: Conjunctivae are normal. PERRL. EOMI. Head: Atraumatic.  Patient has maxillary and frontal sinus tenderness. ENT:      Nose: No congestion/rhinnorhea.      Mouth/Throat: Mucous membranes are moist.  Neck: No stridor.  No cervical spine tenderness to palpation. Cardiovascular: Normal rate, regular rhythm. Normal S1 and S2.  Good peripheral circulation. Respiratory: Normal respiratory effort without tachypnea or retractions. Lungs CTAB. Good air entry to the bases with no decreased or absent breath sounds. Skin:  Skin is warm, dry and intact. No rash noted. Psychiatric:  Mood and affect are normal. Speech and behavior are normal. Patient exhibits appropriate insight and judgement.   ____________________________________________   LABS (all labs ordered are listed, but only abnormal results are displayed)  Labs Reviewed - No data to display ____________________________________________  EKG   ____________________________________________  RADIOLOGY I personally viewed and evaluated these images as part of my medical decision making, as well as reviewing the written report by the radiologist.  Ct Head Wo Contrast  Result Date: 10/18/2019 CLINICAL DATA:  Cerebral hemorrhage suspected Patient reports pressure behind eyes with nasal congestion. EXAM: CT HEAD WITHOUT CONTRAST TECHNIQUE: Contiguous axial images were obtained from the base of the skull through the vertex without intravenous contrast. COMPARISON:  None. FINDINGS: Brain: No intracranial hemorrhage, mass effect, or midline shift. No hydrocephalus. The basilar cisterns are patent. No evidence of territorial infarct or acute ischemia. No  extra-axial or intracranial fluid collection. Vascular: No hyperdense vessel or unexpected calcification. Skull: No fracture or focal lesion. Sinuses/Orbits: Paranasal sinuses and mastoid air cells are clear. Chronic aspect K shin of a single right ethmoid air cells. The visualized orbits are unremarkable. Bilateral cataract resection. Other: None. IMPRESSION: Unremarkable noncontrast head CT. Electronically Signed   By: Narda Rutherford M.D.   On: 10/18/2019 20:05    ____________________________________________    PROCEDURES  Procedure(s) performed:    Procedures    Medications - No data to display   ____________________________________________   INITIAL IMPRESSION / ASSESSMENT AND PLAN / ED COURSE  Pertinent labs & imaging results that were available during my care of the patient were reviewed by me and considered in my medical decision making (see  chart for details).  Review of the [place] CSRS was performed in accordance of the [facility] prior to dispensing any controlled drugs.           Assessment and plan Facial pain 73 year old male presents to the emergency department with right-sided facial discomfort and concern after a fall.  Patient was hypertensive at triage but vital signs were otherwise reassuring.  He had some maxillary sinus tenderness on exam.  CT head revealed no acute abnormality.  Patient was advised to continue taking his doxycycline and Flonase as directed for sinusitis.  Return precautions were given.  All patient questions were answered.   ____________________________________________  FINAL CLINICAL IMPRESSION(S) / ED DIAGNOSES  Final diagnoses:  Acute maxillary sinusitis, recurrence not specified      NEW MEDICATIONS STARTED DURING THIS VISIT:  ED Discharge Orders    None          This chart was dictated using voice recognition software/Dragon. Despite best efforts to proofread, errors can occur which can change the meaning. Any change was purely unintentional..j   Gasper Lloyd 10/18/19 2043    Minna Antis, MD 10/18/19 2117

## 2019-10-20 ENCOUNTER — Other Ambulatory Visit: Payer: Self-pay

## 2019-10-20 ENCOUNTER — Emergency Department: Payer: Medicare Other

## 2019-10-20 DIAGNOSIS — W19XXXA Unspecified fall, initial encounter: Secondary | ICD-10-CM | POA: Insufficient documentation

## 2019-10-20 DIAGNOSIS — S8011XA Contusion of right lower leg, initial encounter: Secondary | ICD-10-CM | POA: Insufficient documentation

## 2019-10-20 DIAGNOSIS — I10 Essential (primary) hypertension: Secondary | ICD-10-CM | POA: Insufficient documentation

## 2019-10-20 DIAGNOSIS — Y999 Unspecified external cause status: Secondary | ICD-10-CM | POA: Diagnosis not present

## 2019-10-20 DIAGNOSIS — Z7901 Long term (current) use of anticoagulants: Secondary | ICD-10-CM | POA: Diagnosis not present

## 2019-10-20 DIAGNOSIS — R600 Localized edema: Secondary | ICD-10-CM | POA: Diagnosis not present

## 2019-10-20 DIAGNOSIS — F17210 Nicotine dependence, cigarettes, uncomplicated: Secondary | ICD-10-CM | POA: Diagnosis not present

## 2019-10-20 DIAGNOSIS — Y939 Activity, unspecified: Secondary | ICD-10-CM | POA: Insufficient documentation

## 2019-10-20 DIAGNOSIS — Y929 Unspecified place or not applicable: Secondary | ICD-10-CM | POA: Diagnosis not present

## 2019-10-20 DIAGNOSIS — Z96641 Presence of right artificial hip joint: Secondary | ICD-10-CM | POA: Diagnosis not present

## 2019-10-20 DIAGNOSIS — S8991XA Unspecified injury of right lower leg, initial encounter: Secondary | ICD-10-CM | POA: Diagnosis present

## 2019-10-20 DIAGNOSIS — Z79899 Other long term (current) drug therapy: Secondary | ICD-10-CM | POA: Diagnosis not present

## 2019-10-20 NOTE — ED Triage Notes (Signed)
Pt to ED via POV for chief complaint of right foot pain and right foot/leg swelling that started "a couple days ago". Pt noted to have swelling to right foot and leg/ below knee, bruising noted to right foot. Pt unsure of dosage changes in diuretics.  Reports a fall on Monday, where he was seen at the ED and hit his upper right thigh.

## 2019-10-21 ENCOUNTER — Emergency Department
Admission: EM | Admit: 2019-10-21 | Discharge: 2019-10-21 | Disposition: A | Discharge status: Home / Self Care | Payer: Medicare Other | Attending: Emergency Medicine | Admitting: Emergency Medicine

## 2019-10-21 ENCOUNTER — Other Ambulatory Visit: Payer: Self-pay

## 2019-10-21 DIAGNOSIS — Z79899 Other long term (current) drug therapy: Secondary | ICD-10-CM | POA: Insufficient documentation

## 2019-10-21 DIAGNOSIS — I10 Essential (primary) hypertension: Secondary | ICD-10-CM | POA: Insufficient documentation

## 2019-10-21 DIAGNOSIS — R319 Hematuria, unspecified: Secondary | ICD-10-CM | POA: Insufficient documentation

## 2019-10-21 DIAGNOSIS — Z7901 Long term (current) use of anticoagulants: Secondary | ICD-10-CM | POA: Insufficient documentation

## 2019-10-21 DIAGNOSIS — S8011XA Contusion of right lower leg, initial encounter: Secondary | ICD-10-CM

## 2019-10-21 DIAGNOSIS — R609 Edema, unspecified: Secondary | ICD-10-CM

## 2019-10-21 DIAGNOSIS — F17210 Nicotine dependence, cigarettes, uncomplicated: Secondary | ICD-10-CM | POA: Insufficient documentation

## 2019-10-21 LAB — URINALYSIS, COMPLETE (UACMP) WITH MICROSCOPIC
Bacteria, UA: NONE SEEN
RBC / HPF: >50 RBC/hpf — ABNORMAL HIGH (ref 0–5)
Specific Gravity, Urine: 1.02 (ref 1.005–1.030)
Squamous Epithelial / HPF: NONE SEEN (ref 0–5)

## 2019-10-21 NOTE — ED Provider Notes (Signed)
[facility] Emergency Department Provider Note _   First MD Initiated Contact with Patient 10/21/19 [phone]     (approximate)  I have reviewed the triage vital signs and the nursing notes.   HISTORY  Chief Complaint right foot pain and Leg Swelling   HPI Roger [name] is a 73 y.o. male with below list of chronic medical conditions including A. fib for which the patient is currently taking Eliquis presents to the emergency department secondary to right lower extremity painless swelling and bruising.  Patient states that he had an accidental fall "couple days ago and has had swelling and bruising since that point.  Patient denies any chest pain no shortness of breath.  Patient states that he is taking his Lasix as prescribed        Past Medical History:  Diagnosis Date  . Anemia   . Arthritis   . Atrial fibrillation (HCC)   . Bladder stone   . Dysrhythmia   . GERD (gastroesophageal reflux disease)   . Hypertension     Patient Active Problem List   Diagnosis Date Noted  . Hypotension 09/01/2019  . Ventricular fibrillation (HCC) 04/19/2019    Past Surgical History:  Procedure Laterality Date  . CATARACT EXTRACTION    . CORONARY STENT INTERVENTION N/A 04/20/2019   Procedure: CORONARY STENT INTERVENTION;  Surgeon: Alwyn Pea, MD;  Location: [facility] INVASIVE CV LAB;  Service: Cardiovascular;  Laterality: N/A;  . CYSTOSCOPY WITH LITHOLAPAXY N/A 10/06/2019   Procedure: CYSTOSCOPY WITH LITHOLAPAXY;  Surgeon: Sondra Come, MD;  Location: [facility] ORS;  Service: Urology;  Laterality: N/A;  . HIP SURGERY    . HOLEP-LASER ENUCLEATION OF THE PROSTATE WITH MORCELLATION N/A 10/06/2019   Procedure: HOLEP-LASER ENUCLEATION OF THE PROSTATE WITH MORCELLATION;  Surgeon: Sondra Come, MD;  Location: [facility] ORS;  Service: Urology;  Laterality: N/A;  . JOINT REPLACEMENT Right 2012   hip right  . LEFT HEART CATH AND CORONARY ANGIOGRAPHY Left 04/20/2019   Procedure: LEFT HEART CATH AND CORONARY ANGIOGRAPHY;  Surgeon: Laurier Nancy, MD;  Location: [facility] INVASIVE CV LAB;  Service: Cardiovascular;  Laterality: Left;    Prior to Admission medications   Medication Sig Start Date End Date Taking? Authorizing Provider  apixaban (ELIQUIS) 2.5 MG TABS tablet Take 2.5 mg by mouth 2 (two) times daily.    [provider]  atorvastatin (LIPITOR) 10 MG tablet Take 10 mg by mouth daily. 05/22/19   [provider]  Cholecalciferol (VITAMIN D3) 1.25 MG (50000 UT) CAPS Take 50,000 Units by mouth every Thursday.  05/02/19   [provider]  ciprofloxacin (CIPRO) 500 MG tablet Take 1 tablet (500 mg total) by mouth 2 (two) times daily for 5 days. 10/17/19 10/22/19  Carman Ching, PA-C  ezetimibe (ZETIA) 10 MG tablet Take 10 mg by mouth daily. 08/13/19   [provider]  fluticasone (FLONASE) 50 MCG/ACT nasal spray Place 2 sprays into the nose daily as needed for allergies.  04/08/19 04/07/20  [provider]  furosemide (LASIX) 20 MG tablet Take 20 mg by mouth daily. 09/12/19   [provider]  ketotifen (ZADITOR) 0.025 % ophthalmic solution Place 1 drop into both eyes daily as needed (allergies).    [provider]  metoprolol tartrate (LOPRESSOR) 25 MG tablet Take 25 mg by mouth 2 (two) times daily.    [provider]  pantoprazole (PROTONIX) 40 MG tablet Take 1 tablet (40 mg total) by mouth daily. Patient taking  differently: Take 40 mg by mouth daily as needed (acid reflux).  04/21/19   Adrian SaranMody, Sital, MD  spironolactone (ALDACTONE) 25 MG tablet Take 25 mg by mouth daily. 08/13/19   [provider]  lisinopril (PRINIVIL,ZESTRIL) 10 MG tablet Take 5 mg by mouth daily.   06/10/19  [provider]    Allergies Amoxicillin, Sulfa antibiotics, Pravastatin, and Rosuvastatin  No family history on file.  Social History Social History   Tobacco Use  . Smoking status: Current Every  Day Smoker    Packs/day: 0.50    Types: Cigarettes  . Smokeless tobacco: Never Used  Substance Use Topics  . Alcohol use: Yes    Comment: rare  . Drug use: No    Review of Systems Constitutional: No fever/chills Eyes: No visual changes. ENT: No sore throat. Cardiovascular: Denies chest pain. Respiratory: Denies shortness of breath. Gastrointestinal: No abdominal pain.  No nausea, no vomiting.  No diarrhea.  No constipation. Genitourinary: Negative for dysuria. Musculoskeletal: Negative for neck pain.  Negative for back pain.  Positive for right lower extremity swelling and bruising. Integumentary: Negative for rash. Neurological: Negative for headaches, focal weakness or numbness.   ____________________________________________   PHYSICAL EXAM:  VITAL SIGNS: ED Triage Vitals  Enc Vitals Group     BP 10/20/19 2200 139/74     Pulse Rate 10/20/19 2200 93     Resp 10/20/19 2200 16     Temp 10/20/19 2200 98.2 F (36.8 C)     Temp Source 10/20/19 2200 Oral     SpO2 10/20/19 2200 96 %     Weight 10/20/19 2202 91.2 kg (201 lb)     Height 10/20/19 2202 1.727 m (5\' 8" )     Head Circumference --      Peak Flow --      Pain Score 10/20/19 2208 1     Pain Loc --      Pain Edu? --      Excl. in [place]? --     Constitutional: Alert and oriented.  Eyes: Conjunctivae are normal.  Mouth/Throat: Patient is wearing a mask. Neck: No stridor.  No meningeal signs.   Cardiovascular: Normal rate, regular rhythm. Good peripheral circulation. Grossly normal heart sounds. Respiratory: Normal respiratory effort.  No retractions. Gastrointestinal: Soft and nontender. No distention.  Musculoskeletal: Bilateral lower extremity pitting edema (right greater than left).  Right lower extremity ecchymoses extending from the medial thigh down to the foot.. Neurologic:  Normal speech and language. No gross focal neurologic deficits are appreciated.  Skin:  Skin is warm, dry and intact. Psychiatric:  Mood and affect are normal. Speech and behavior are normal.  _______________________________________________  RADIOLOGY I, [place] N , personally viewed and evaluated these images (plain radiographs) as part of my medical decision making, as well as reviewing the written report by the radiologist.  ED MD interpretation: No evidence of thrombus on right lower extremity venous Doppler ultrasound per radiologist.  Official radiology report(s): [place] Venous Img Lower Unilateral Right  Result Date: 10/20/2019 CLINICAL DATA:  Right lower extremity swelling EXAM: RIGHT LOWER EXTREMITY VENOUS DOPPLER ULTRASOUND TECHNIQUE: Gray-scale sonography with graded compression, as well as color Doppler and duplex ultrasound were performed to evaluate the lower extremity deep venous systems from the level of the common femoral vein and including the common femoral, femoral, profunda femoral, popliteal and calf veins including the posterior tibial, peroneal and gastrocnemius veins when visible. The superficial great saphenous vein was also interrogated. Spectral Doppler was utilized to  evaluate flow at rest and with distal augmentation maneuvers in the common femoral, femoral and popliteal veins. COMPARISON:  None. FINDINGS: Contralateral Common Femoral Vein: Respiratory phasicity is normal and symmetric with the symptomatic side. No evidence of thrombus. Normal compressibility. Common Femoral Vein: No evidence of thrombus. Normal compressibility, respiratory phasicity and response to augmentation. Saphenofemoral Junction: No evidence of thrombus. Normal compressibility and flow on color Doppler imaging. Profunda Femoral Vein: No evidence of thrombus. Normal compressibility and flow on color Doppler imaging. Femoral Vein: No evidence of thrombus. Normal compressibility, respiratory phasicity and response to augmentation. Popliteal Vein: No evidence of thrombus. Normal compressibility, respiratory phasicity and response  to augmentation. Calf Veins: No evidence of thrombus. Normal compressibility and flow on color Doppler imaging. Superficial Great Saphenous Vein: No evidence of thrombus. Normal compressibility. Venous Reflux:  None. Other Findings:  None. IMPRESSION: No evidence of deep venous thrombosis. Electronically Signed   By: Rolm Baptise M.D.   On: 10/20/2019 23:06     Procedures   ____________________________________________   INITIAL IMPRESSION / MDM / [place] / ED COURSE  As part of my medical decision making, I reviewed the following data within the electronic MEDICAL RECORD NUMBER 73 year old male presenting with above-stated history and physical exam secondary to right lower extremity swelling and ecchymosis following fall.  Ultrasound performed revealed no evidence of DVT.  Suspect patient's ecchymosis to be secondary to fact the patient is anticoagulated and did fall striking that right leg.  ____________________________________________  FINAL CLINICAL IMPRESSION(S) / ED DIAGNOSES  Final diagnoses:  Peripheral edema  Contusion of right leg, initial encounter     MEDICATIONS GIVEN DURING THIS VISIT:  Medications - No data to display   ED Discharge Orders    None      *Please note:  Roger [name] was evaluated in Emergency Department on 10/21/2019 for the symptoms described in the history of present illness. He was evaluated in the context of the global COVID-19 pandemic, which necessitated consideration that the patient might be at risk for infection with the SARS-CoV-2 virus that causes COVID-19. Institutional protocols and algorithms that pertain to the evaluation of patients at risk for COVID-19 are in a state of rapid change based on information released by regulatory bodies including the CDC and federal and state organizations. These policies and algorithms were followed during the patient's care in the ED.  Some ED evaluations and interventions may be delayed as a  result of limited staffing during the pandemic.*  Note:  This document was prepared using Dragon voice recognition software and may include unintentional dictation errors.   Gregor Hams, MD 10/21/19 [phone]

## 2019-10-21 NOTE — ED Triage Notes (Signed)
Pt to the er for hematuria. Pt had an indwelling catheter removed on Nov 3rd. Pt currently wearing depends but states he now has blood in his urine that is mostly blood. Pt has ahd multiple visits for urinary symptoms.

## 2019-10-22 ENCOUNTER — Emergency Department
Admission: EM | Admit: 2019-10-22 | Discharge: 2019-10-22 | Disposition: A | Discharge status: Home / Self Care | Payer: Medicare Other | Source: Home / Self Care | Attending: Emergency Medicine | Admitting: Emergency Medicine

## 2019-10-22 DIAGNOSIS — R319 Hematuria, unspecified: Secondary | ICD-10-CM

## 2019-10-22 NOTE — ED Notes (Signed)
Pt states he has blood in his urine.  He reports that he experiences occasional pain with urination but he is currently in no pain.

## 2019-10-22 NOTE — ED Provider Notes (Signed)
[facility] Emergency Department Provider Note   First MD Initiated Contact with Patient 10/22/19 0037     (approximate)  I have reviewed the triage vital signs and the nursing notes.   HISTORY  Chief Complaint Hematuria    HPI Roger [name] is a 73 y.o. male with below list of previous medical conditions presents to the emergency department secondary to hematuria.   Patient states that he has had ongoing hematuria since having a cystoscopy performed on 10/06/2019.  Patient denies any inability to urinate.  Patient denies any dysuria.  Patient states that he has an appointment urology on Tuesday.  Patient denies any fever afebrile on presentation.  Patient denies any abdominal pain       Past Medical History:  Diagnosis Date  . Anemia   . Arthritis   . Atrial fibrillation (HCC)   . Bladder stone   . Dysrhythmia   . GERD (gastroesophageal reflux disease)   . Hypertension     Patient Active Problem List   Diagnosis Date Noted  . Hypotension 09/01/2019  . Ventricular fibrillation (HCC) 04/19/2019    Past Surgical History:  Procedure Laterality Date  . CATARACT EXTRACTION    . CORONARY STENT INTERVENTION N/A 04/20/2019   Procedure: CORONARY STENT INTERVENTION;  Surgeon: Alwyn Peaallwood, Dwayne D, MD;  Location: [facility] INVASIVE CV LAB;  Service: Cardiovascular;  Laterality: N/A;  . CYSTOSCOPY WITH LITHOLAPAXY N/A 10/06/2019   Procedure: CYSTOSCOPY WITH LITHOLAPAXY;  Surgeon: Sondra ComeSninsky, Brian C, MD;  Location: [facility] ORS;  Service: Urology;  Laterality: N/A;  . HIP SURGERY    . HOLEP-LASER ENUCLEATION OF THE PROSTATE WITH MORCELLATION N/A 10/06/2019   Procedure: HOLEP-LASER ENUCLEATION OF THE PROSTATE WITH MORCELLATION;  Surgeon: Sondra ComeSninsky, Brian C, MD;  Location: [facility] ORS;  Service: Urology;  Laterality: N/A;  . JOINT REPLACEMENT Right 2012   hip right  . LEFT HEART CATH AND CORONARY ANGIOGRAPHY Left 04/20/2019   Procedure: LEFT HEART CATH AND CORONARY  ANGIOGRAPHY;  Surgeon: Laurier NancyKhan, Shaukat A, MD;  Location: [facility] INVASIVE CV LAB;  Service: Cardiovascular;  Laterality: Left;    Prior to Admission medications   Medication Sig Start Date End Date Taking? Authorizing Provider  apixaban (ELIQUIS) 2.5 MG TABS tablet Take 2.5 mg by mouth 2 (two) times daily.    [provider]  atorvastatin (LIPITOR) 10 MG tablet Take 10 mg by mouth daily. 05/22/19   [provider]  Cholecalciferol (VITAMIN D3) 1.25 MG (50000 UT) CAPS Take 50,000 Units by mouth every Thursday.  05/02/19   [provider]  ciprofloxacin (CIPRO) 500 MG tablet Take 1 tablet (500 mg total) by mouth 2 (two) times daily for 5 days. 10/17/19 10/22/19  Carman ChingVaillancourt, Samantha, PA-C  ezetimibe (ZETIA) 10 MG tablet Take 10 mg by mouth daily. 08/13/19   [provider]  fluticasone (FLONASE) 50 MCG/ACT nasal spray Place 2 sprays into the nose daily as needed for allergies.  04/08/19 04/07/20  [provider]  furosemide (LASIX) 20 MG tablet Take 20 mg by mouth daily. 09/12/19   [provider]  ketotifen (ZADITOR) 0.025 % ophthalmic solution Place 1 drop into both eyes daily as needed (allergies).    [provider]  metoprolol tartrate (LOPRESSOR) 25 MG tablet Take 25 mg by mouth 2 (two) times daily.    [provider]  pantoprazole (PROTONIX) 40 MG tablet Take 1 tablet (40 mg total) by mouth daily. Patient taking differently: Take 40 mg by mouth daily as needed (acid  reflux).  04/21/19   Bettey Costa, MD  spironolactone (ALDACTONE) 25 MG tablet Take 25 mg by mouth daily. 08/13/19   [provider]  lisinopril (PRINIVIL,ZESTRIL) 10 MG tablet Take 5 mg by mouth daily.   06/10/19  [provider]    Allergies Amoxicillin, Sulfa antibiotics, Pravastatin, and Rosuvastatin  No family history on file.  Social History Social History   Tobacco Use  . Smoking status: Current Every Day Smoker    Packs/day: 0.50    Types:  Cigarettes  . Smokeless tobacco: Never Used  Substance Use Topics  . Alcohol use: Yes    Comment: rare  . Drug use: No    Review of Systems Constitutional: No fever/chills Eyes: No visual changes. ENT: No sore throat. Cardiovascular: Denies chest pain. Respiratory: Denies shortness of breath. Gastrointestinal: No abdominal pain.  No nausea, no vomiting.  No diarrhea.  No constipation. Genitourinary: Negative for dysuria.  Positive for hematuria Musculoskeletal: Negative for neck pain.  Negative for back pain. Integumentary: Negative for rash. Neurological: Negative for headaches, focal weakness or numbness.  ____________________________________________   PHYSICAL EXAM:  VITAL SIGNS: ED Triage Vitals [10/21/19 2110]  Enc Vitals Group     BP 126/68     Pulse Rate 90     Resp 18     Temp 99 F (37.2 C)     Temp Source Oral     SpO2 98 %     Weight 91.2 kg (200 lb 15.9 oz)     Height 1.727 m (5\' 8" )     Head Circumference      Peak Flow      Pain Score 0     Pain Loc      Pain Edu?      Excl. in [place]?      Constitutional: Alert and oriented.  Eyes: Conjunctivae are normal.  Head: Atraumatic. Mouth/Throat: Patient is wearing a mask. Neck: No stridor.  No meningeal signs.   Cardiovascular: Normal rate, regular rhythm. Good peripheral circulation. Grossly normal heart sounds. Respiratory: Normal respiratory effort.  No retractions. Gastrointestinal: Soft and nontender. No distention.  Musculoskeletal: No lower extremity tenderness nor edema. No gross deformities of extremities. Neurologic:  Normal speech and language. No gross focal neurologic deficits are appreciated.  Skin:  Skin is warm, dry and intact. Psychiatric: Mood and affect are normal. Speech and behavior are normal.  ____________________________________________   LABS (all labs ordered are listed, but only abnormal results are displayed)  Labs Reviewed  URINALYSIS, COMPLETE (UACMP) WITH MICROSCOPIC  - Abnormal; Notable for the following components:      Result Value   Color, Urine RED (*)    APPearance CLOUDY (*)    Glucose, UA   (*)    Value: TEST NOT REPORTED DUE TO COLOR INTERFERENCE OF URINE PIGMENT   Hgb urine dipstick   (*)    Value: TEST NOT REPORTED DUE TO COLOR INTERFERENCE OF URINE PIGMENT   Bilirubin Urine   (*)    Value: TEST NOT REPORTED DUE TO COLOR INTERFERENCE OF URINE PIGMENT   Ketones, ur   (*)    Value: TEST NOT REPORTED DUE TO COLOR INTERFERENCE OF URINE PIGMENT   Protein, ur   (*)    Value: TEST NOT REPORTED DUE TO COLOR INTERFERENCE OF URINE PIGMENT   Nitrite   (*)    Value: TEST NOT REPORTED DUE TO COLOR INTERFERENCE OF URINE PIGMENT   Leukocytes,Ua   (*)    Value: TEST  NOT REPORTED DUE TO COLOR INTERFERENCE OF URINE PIGMENT   RBC / HPF >50 (*)    All other components within normal limits   _____________________________   Procedures   ____________________________________________   INITIAL IMPRESSION / MDM / ASSESSMENT AND PLAN / ED COURSE  As part of my medical decision making, I reviewed the following data within the electronic MEDICAL RECORD NUMBER  74 year old male presenting with hematuria.  Urinalysis consistent with hematuria without evidence of infection.  I reviewed the patient's cystoscopy report from 1030.  Patient able to urinate without difficulty post void residual was 34.  Patient advised to follow-up with Dr. Irish Elders urology as planned on Tuesday      ____________________________________________  FINAL CLINICAL IMPRESSION(S) / ED DIAGNOSES  Final diagnoses:  Hematuria, unspecified type     MEDICATIONS GIVEN DURING THIS VISIT:  Medications - No data to display   ED Discharge Orders    None      *Please note:  Roger [name] was evaluated in Emergency Department on 10/22/2019 for the symptoms described in the history of present illness. He was evaluated in the context of the global COVID-19 pandemic, which necessitated  consideration that the patient might be at risk for infection with the SARS-CoV-2 virus that causes COVID-19. Institutional protocols and algorithms that pertain to the evaluation of patients at risk for COVID-19 are in a state of rapid change based on information released by regulatory bodies including the CDC and federal and state organizations. These policies and algorithms were followed during the patient's care in the ED.  Some ED evaluations and interventions may be delayed as a result of limited staffing during the pandemic.*  Note:  This document was prepared using Dragon voice recognition software and may include unintentional dictation errors.   Darci Current, MD 10/22/19 [phone]

## 2019-10-24 ENCOUNTER — Other Ambulatory Visit: Payer: Self-pay

## 2019-10-24 ENCOUNTER — Other Ambulatory Visit: Payer: Medicare Other

## 2019-10-24 ENCOUNTER — Other Ambulatory Visit: Payer: Self-pay | Admitting: Physician Assistant

## 2019-10-24 DIAGNOSIS — N3001 Acute cystitis with hematuria: Secondary | ICD-10-CM

## 2019-10-25 ENCOUNTER — Other Ambulatory Visit: Payer: Self-pay

## 2019-10-25 ENCOUNTER — Emergency Department
Admission: EM | Admit: 2019-10-25 | Discharge: 2019-10-25 | Disposition: A | Discharge status: Home / Self Care | Payer: Medicare Other | Attending: Emergency Medicine | Admitting: Emergency Medicine

## 2019-10-25 ENCOUNTER — Encounter: Payer: Self-pay | Admitting: Medical Oncology

## 2019-10-25 ENCOUNTER — Emergency Department: Payer: Medicare Other

## 2019-10-25 DIAGNOSIS — Z955 Presence of coronary angioplasty implant and graft: Secondary | ICD-10-CM | POA: Insufficient documentation

## 2019-10-25 DIAGNOSIS — F17210 Nicotine dependence, cigarettes, uncomplicated: Secondary | ICD-10-CM | POA: Insufficient documentation

## 2019-10-25 DIAGNOSIS — Z96641 Presence of right artificial hip joint: Secondary | ICD-10-CM | POA: Insufficient documentation

## 2019-10-25 DIAGNOSIS — Y92009 Unspecified place in unspecified non-institutional (private) residence as the place of occurrence of the external cause: Secondary | ICD-10-CM | POA: Diagnosis not present

## 2019-10-25 DIAGNOSIS — Z7901 Long term (current) use of anticoagulants: Secondary | ICD-10-CM | POA: Insufficient documentation

## 2019-10-25 DIAGNOSIS — W1849XA Other slipping, tripping and stumbling without falling, initial encounter: Secondary | ICD-10-CM | POA: Insufficient documentation

## 2019-10-25 DIAGNOSIS — S93401A Sprain of unspecified ligament of right ankle, initial encounter: Secondary | ICD-10-CM

## 2019-10-25 DIAGNOSIS — Z79899 Other long term (current) drug therapy: Secondary | ICD-10-CM | POA: Insufficient documentation

## 2019-10-25 DIAGNOSIS — I10 Essential (primary) hypertension: Secondary | ICD-10-CM | POA: Insufficient documentation

## 2019-10-25 DIAGNOSIS — Y999 Unspecified external cause status: Secondary | ICD-10-CM | POA: Insufficient documentation

## 2019-10-25 DIAGNOSIS — S99911A Unspecified injury of right ankle, initial encounter: Secondary | ICD-10-CM | POA: Diagnosis present

## 2019-10-25 DIAGNOSIS — Y9389 Activity, other specified: Secondary | ICD-10-CM | POA: Diagnosis not present

## 2019-10-25 LAB — URINALYSIS, COMPLETE
Bilirubin, UA: NEGATIVE
Glucose, UA: NEGATIVE
Nitrite, UA: NEGATIVE
Specific Gravity, UA: 1.02 (ref 1.005–1.030)
Urobilinogen, Ur: 1 mg/dL (ref 0.2–1.0)
pH, UA: 5 (ref 5.0–7.5)

## 2019-10-25 LAB — MICROSCOPIC EXAMINATION
Epithelial Cells (non renal): NONE SEEN /hpf (ref 0–10)
RBC, Urine: >30 /hpf — AB (ref 0–2)
WBC, UA: >30 /hpf — AB (ref 0–5)

## 2019-10-25 NOTE — Discharge Instructions (Addendum)
Follow-up with your primary care provider if any continued problems.  Ice and elevate your ankle as needed for swelling.  You may continue taking Tylenol if needed for pain.  An Ace wrap was used to apply some compression and support to your ankle.  You may wear this for several days until you start seeing some improvement.  No fractures were seen on your x-rays today.

## 2019-10-25 NOTE — ED Provider Notes (Signed)
[facility] Emergency Department Provider Note  ____________________________________________   First MD Initiated Contact with Patient 10/25/19 [phone]     (approximate)  I have reviewed the triage vital signs and the nursing notes.   HISTORY  Chief Complaint Ankle Pain   HPI Roger [name] is a 73 y.o. male presents to the ED with complaint of right ankle pain for 2 days.  Patient states that he tripped at home near a chair while he was adjusting blinds on the window and twisted his ankle.  He did not fall to the floor and there was no history of head injury.  Patient states that he has continued to have swelling with some bruising to his ankle.  He continues to ambulate but states that weightbearing does increase his pain.  He does not have any pain that keeps him awake at night.  He continues to take his regular medication as prescribed by his PCP.     Past Medical History:  Diagnosis Date  . Anemia   . Arthritis   . Atrial fibrillation ([place])   . Bladder stone   . Dysrhythmia   . GERD (gastroesophageal reflux disease)   . Hypertension     Patient Active Problem List   Diagnosis Date Noted  . Hypotension 09/01/2019  . Ventricular fibrillation ([place]) 04/19/2019    Past Surgical History:  Procedure Laterality Date  . CATARACT EXTRACTION    . CORONARY STENT INTERVENTION N/A 04/20/2019   Procedure: CORONARY STENT INTERVENTION;  Surgeon: Yolonda Kida, MD;  Location: [place] CV LAB;  Service: Cardiovascular;  Laterality: N/A;  . CYSTOSCOPY WITH LITHOLAPAXY N/A 10/06/2019   Procedure: CYSTOSCOPY WITH LITHOLAPAXY;  Surgeon: Billey Co, MD;  Location: [facility] ORS;  Service: Urology;  Laterality: N/A;  . HIP SURGERY    . HOLEP-LASER ENUCLEATION OF THE PROSTATE WITH MORCELLATION N/A 10/06/2019   Procedure: HOLEP-LASER ENUCLEATION OF THE PROSTATE WITH MORCELLATION;  Surgeon: Billey Co, MD;  Location: [facility] ORS;  Service: Urology;   Laterality: N/A;  . JOINT REPLACEMENT Right 2012   hip right  . LEFT HEART CATH AND CORONARY ANGIOGRAPHY Left 04/20/2019   Procedure: LEFT HEART CATH AND CORONARY ANGIOGRAPHY;  Surgeon: Dionisio David, MD;  Location: [place] CV LAB;  Service: Cardiovascular;  Laterality: Left;    Prior to Admission medications   Medication Sig Start Date End Date Taking? Authorizing Provider  apixaban (ELIQUIS) 2.5 MG TABS tablet Take 2.5 mg by mouth 2 (two) times daily.    [provider]  atorvastatin (LIPITOR) 10 MG tablet Take 10 mg by mouth daily. 05/22/19   [provider]  Cholecalciferol (VITAMIN D3) 1.25 MG (50000 UT) CAPS Take 50,000 Units by mouth every Thursday.  05/02/19   [provider]  ezetimibe (ZETIA) 10 MG tablet Take 10 mg by mouth daily. 08/13/19   [provider]  fluticasone (FLONASE) 50 MCG/ACT nasal spray Place 2 sprays into the nose daily as needed for allergies.  04/08/19 04/07/20  [provider]  furosemide (LASIX) 20 MG tablet Take 20 mg by mouth daily. 09/12/19   [provider]  ketotifen (ZADITOR) 0.025 % ophthalmic solution Place 1 drop into both eyes daily as needed (allergies).    [provider]  metoprolol tartrate (LOPRESSOR) 25 MG tablet Take 25 mg by mouth 2 (two) times daily.    [provider]  pantoprazole (PROTONIX) 40 MG tablet Take 1 tablet (40 mg total) by mouth daily. Patient  taking differently: Take 40 mg by mouth daily as needed (acid reflux).  04/21/19   Adrian Saran, MD  spironolactone (ALDACTONE) 25 MG tablet Take 25 mg by mouth daily. 08/13/19   [provider]  lisinopril (PRINIVIL,ZESTRIL) 10 MG tablet Take 5 mg by mouth daily.   06/10/19  [provider]    Allergies Amoxicillin, Sulfa antibiotics, Ibuprofen, Pravastatin, and Rosuvastatin  No family history on file.  Social History Social History   Tobacco Use  . Smoking status: Current Every Day Smoker     Packs/day: 0.50    Types: Cigarettes  . Smokeless tobacco: Never Used  Substance Use Topics  . Alcohol use: Yes    Comment: rare  . Drug use: No    Review of Systems Constitutional: No fever/chills Eyes: No visual changes. Cardiovascular: Denies chest pain. Respiratory: Denies shortness of breath. Gastrointestinal: No abdominal pain.  No nausea, no vomiting.  Musculoskeletal: Positive for right ankle pain. Skin: Positive ecchymosis right ankle. Neurological: Negative for  focal weakness or numbness. ____________________________________________   PHYSICAL EXAM:  VITAL SIGNS: ED Triage Vitals [10/25/19 0827]  Enc Vitals Group     BP 113/68     Pulse Rate 78     Resp 18     Temp 97.8 F (36.6 C)     Temp Source Oral     SpO2 100 %     Weight 200 lb 9.9 oz (91 kg)     Height 5\' 8"  (1.727 m)     Head Circumference      Peak Flow      Pain Score 0     Pain Loc      Pain Edu?      Excl. in [place]?     Constitutional: Alert and oriented. Well appearing and in no acute distress. Eyes: Conjunctivae are normal.  Head: Atraumatic. Neck: No stridor.   Cardiovascular: Normal rate, regular rhythm. Grossly normal heart sounds.  Good peripheral circulation. Respiratory: Normal respiratory effort.  No retractions. Lungs CTAB. Musculoskeletal: On examination of the right ankle there is moderate soft tissue edema and ecchymosis on the anterior and lateral aspect.  No abrasions or bleeding noted.  Patient is able to flex and extend his ankle slowly and is guarded.  Patient is able to ambulate without any assistance and is able to weight-bear.  No gross deformities noted on exam.  Pulses present.  Patient has chronic edema bilateral lower extremity secondary to his heart related issues and taking Lasix.  Pulses present.  Motor sensory function intact distal to his injury. Neurologic:  Normal speech and language. No gross focal neurologic deficits are appreciated. No gait instability. Skin:   Skin is warm, dry and intact.  Ecchymosis as noted above. Psychiatric: Mood and affect are normal. Speech and behavior are normal.  ____________________________________________   LABS (all labs ordered are listed, but only abnormal results are displayed)  Labs Reviewed - No data to display   RADIOLOGY  Official radiology report(s): Dg Ankle Complete Right  Result Date: 10/25/2019 CLINICAL DATA:  Ankle pain/injury 2 days ago EXAM: RIGHT ANKLE - COMPLETE 3+ VIEW COMPARISON:  None. FINDINGS: No fracture or dislocation is seen. The ankle mortise is intact. The base of the fifth metatarsal is unremarkable. Moderate plantar calcaneal enthesophyte. Mild diffuse soft tissue swelling. IMPRESSION: Negative. Electronically Signed   By: 10/27/2019 M.D.   On: 10/25/2019 09:13    ____________________________________________   PROCEDURES  Procedure(s) performed (including Critical Care):  Procedures  ____________________________________________   INITIAL IMPRESSION / ASSESSMENT AND PLAN / ED COURSE  As part of my medical decision making, I reviewed the following data within the electronic MEDICAL RECORD NUMBER Notes from prior ED visits and [place] Controlled Substance Database  73 year old male presents to the ED with complaint of right ankle pain after he stumbled in his home 2 days ago and twisted his ankle.  He has continued to ambulate since his accident and bear weight.  He states that this is not keeping him up at night and he has not taken any over-the-counter pain medication for this.  Physical exam shows nonpitting edema with ecchymosis on the anterior and lateral portion of his ankle.  X-rays were negative for acute bony injury and patient was made aware.  Currently he is on Eliquis.  We discussed taking acetaminophen which he is comfortable with as needed.  He was encouraged to ice and elevate as needed for swelling.  He is to follow-up with his PCP if any continued problems.   ____________________________________________   FINAL CLINICAL IMPRESSION(S) / ED DIAGNOSES  Final diagnoses:  Sprain of right ankle, unspecified ligament, initial encounter     ED Discharge Orders    None       Note:  This document was prepared using Dragon voice recognition software and may include unintentional dictation errors.    Tommi RumpsSummers,  L, PA-C 10/25/19 1057    Dionne BucySiadecki, Sebastian, MD 10/25/19 1218

## 2019-10-25 NOTE — ED Notes (Signed)
Radiology to bedside. 

## 2019-10-25 NOTE — ED Triage Notes (Signed)
Pt reports twisting rt ankle 2 days ago. Pt able to ambulate on leg.

## 2019-10-26 LAB — CULTURE, URINE COMPREHENSIVE

## 2019-10-27 ENCOUNTER — Emergency Department: Payer: Medicare Other

## 2019-10-27 ENCOUNTER — Encounter: Payer: Self-pay | Admitting: Emergency Medicine

## 2019-10-27 ENCOUNTER — Emergency Department
Admission: EM | Admit: 2019-10-27 | Discharge: 2019-10-27 | Disposition: A | Discharge status: Home / Self Care | Payer: Medicare Other | Attending: Emergency Medicine | Admitting: Emergency Medicine

## 2019-10-27 ENCOUNTER — Other Ambulatory Visit: Payer: Self-pay

## 2019-10-27 DIAGNOSIS — R2241 Localized swelling, mass and lump, right lower limb: Secondary | ICD-10-CM | POA: Insufficient documentation

## 2019-10-27 DIAGNOSIS — Z79899 Other long term (current) drug therapy: Secondary | ICD-10-CM | POA: Insufficient documentation

## 2019-10-27 DIAGNOSIS — F17210 Nicotine dependence, cigarettes, uncomplicated: Secondary | ICD-10-CM | POA: Insufficient documentation

## 2019-10-27 DIAGNOSIS — I10 Essential (primary) hypertension: Secondary | ICD-10-CM | POA: Insufficient documentation

## 2019-10-27 DIAGNOSIS — Z7901 Long term (current) use of anticoagulants: Secondary | ICD-10-CM | POA: Insufficient documentation

## 2019-10-27 DIAGNOSIS — M25561 Pain in right knee: Secondary | ICD-10-CM | POA: Diagnosis present

## 2019-10-27 DIAGNOSIS — W19XXXD Unspecified fall, subsequent encounter: Secondary | ICD-10-CM | POA: Diagnosis not present

## 2019-10-27 LAB — CBC WITH DIFFERENTIAL/PLATELET
Abs Immature Granulocytes: 0.06 10*3/uL (ref 0.00–0.07)
Basophils Absolute: 0.1 10*3/uL (ref 0.0–0.1)
Basophils Relative: 1 %
Eosinophils Absolute: 0.1 10*3/uL (ref 0.0–0.5)
Eosinophils Relative: 1 %
HCT: 35.6 % — ABNORMAL LOW (ref 39.0–52.0)
Hemoglobin: 11.6 g/dL — ABNORMAL LOW (ref 13.0–17.0)
Immature Granulocytes: 1 %
Lymphocytes Relative: 11 %
Lymphs Abs: 0.9 10*3/uL (ref 0.7–4.0)
MCH: 29.5 pg (ref 26.0–34.0)
MCHC: 32.6 g/dL (ref 30.0–36.0)
MCV: 90.6 fL (ref 80.0–100.0)
Monocytes Absolute: 0.5 10*3/uL (ref 0.1–1.0)
Monocytes Relative: 6 %
Neutro Abs: 7.1 10*3/uL (ref 1.7–7.7)
Neutrophils Relative %: 80 %
Platelets: 270 10*3/uL (ref 150–400)
RBC: 3.93 MIL/uL — ABNORMAL LOW (ref 4.22–5.81)
RDW: 15.9 % — ABNORMAL HIGH (ref 11.5–15.5)
WBC: 8.8 10*3/uL (ref 4.0–10.5)
nRBC: 0 % (ref 0.0–0.2)

## 2019-10-27 LAB — BASIC METABOLIC PANEL
Anion gap: 10 (ref 5–15)
BUN: 26 mg/dL — ABNORMAL HIGH (ref 8–23)
CO2: 27 mmol/L (ref 22–32)
Calcium: 8.6 mg/dL — ABNORMAL LOW (ref 8.9–10.3)
Chloride: 102 mmol/L (ref 98–111)
Creatinine, Ser: 1.07 mg/dL (ref 0.61–1.24)
GFR calc Af Amer: >60 mL/min (ref >60)
GFR calc non Af Amer: >60 mL/min (ref >60)
Glucose, Bld: 118 mg/dL — ABNORMAL HIGH (ref 70–99)
Potassium: 4 mmol/L (ref 3.5–5.1)
Sodium: 139 mmol/L (ref 135–145)

## 2019-10-27 LAB — SEDIMENTATION RATE: Sed Rate: 52 mm/hr — ABNORMAL HIGH (ref 0–20)

## 2019-10-27 LAB — URIC ACID: Uric Acid, Serum: 7.5 mg/dL (ref 3.7–8.6)

## 2019-10-27 NOTE — ED Notes (Signed)
See triage note  Presents with swelling and some redness to right knee  Denies nay pain  States he went to [facility]  States they would not see him  Ambulates well

## 2019-10-27 NOTE — ED Provider Notes (Signed)
[facility] Emergency Department Provider Note   ____________________________________________   First MD Initiated Contact with Patient 10/27/19 [phone]     (approximate)  I have reviewed the triage vital signs and the nursing notes.   HISTORY  Chief Complaint Knee Pain    HPI Roger [name] is a 72 y.o. male patient presents with edema to the right knee secondary to a fall a week ago.  Patient x-ray is unremarkable on date of injury.  Patient states increased swelling which increased with ambulation.  Patient states no pain with sitting or lying down.  Request to have the knee drained.  Patient is currently taking Eliquis for A. fib.         Past Medical History:  Diagnosis Date  . Anemia   . Arthritis   . Atrial fibrillation (HCC)   . Bladder stone   . Dysrhythmia   . GERD (gastroesophageal reflux disease)   . Hypertension     Patient Active Problem List   Diagnosis Date Noted  . Hypotension 09/01/2019  . Ventricular fibrillation (HCC) 04/19/2019    Past Surgical History:  Procedure Laterality Date  . CATARACT EXTRACTION    . CORONARY STENT INTERVENTION N/A 04/20/2019   Procedure: CORONARY STENT INTERVENTION;  Surgeon: Alwyn Pea, MD;  Location: [facility] INVASIVE CV LAB;  Service: Cardiovascular;  Laterality: N/A;  . CYSTOSCOPY WITH LITHOLAPAXY N/A 10/06/2019   Procedure: CYSTOSCOPY WITH LITHOLAPAXY;  Surgeon: Sondra Come, MD;  Location: [facility] ORS;  Service: Urology;  Laterality: N/A;  . HIP SURGERY    . HOLEP-LASER ENUCLEATION OF THE PROSTATE WITH MORCELLATION N/A 10/06/2019   Procedure: HOLEP-LASER ENUCLEATION OF THE PROSTATE WITH MORCELLATION;  Surgeon: Sondra Come, MD;  Location: [facility] ORS;  Service: Urology;  Laterality: N/A;  . JOINT REPLACEMENT Right 2012   hip right  . LEFT HEART CATH AND CORONARY ANGIOGRAPHY Left 04/20/2019   Procedure: LEFT HEART CATH AND CORONARY ANGIOGRAPHY;  Surgeon: Laurier Nancy, MD;   Location: [facility] INVASIVE CV LAB;  Service: Cardiovascular;  Laterality: Left;    Prior to Admission medications   Medication Sig Start Date End Date Taking? Authorizing Provider  apixaban (ELIQUIS) 2.5 MG TABS tablet Take 2.5 mg by mouth 2 (two) times daily.    [provider]  atorvastatin (LIPITOR) 10 MG tablet Take 10 mg by mouth daily. 05/22/19   [provider]  Cholecalciferol (VITAMIN D3) 1.25 MG (50000 UT) CAPS Take 50,000 Units by mouth every Thursday.  05/02/19   [provider]  ezetimibe (ZETIA) 10 MG tablet Take 10 mg by mouth daily. 08/13/19   [provider]  fluticasone (FLONASE) 50 MCG/ACT nasal spray Place 2 sprays into the nose daily as needed for allergies.  04/08/19 04/07/20  [provider]  furosemide (LASIX) 20 MG tablet Take 20 mg by mouth daily. 09/12/19   [provider]  ketotifen (ZADITOR) 0.025 % ophthalmic solution Place 1 drop into both eyes daily as needed (allergies).    [provider]  metoprolol tartrate (LOPRESSOR) 25 MG tablet Take 25 mg by mouth 2 (two) times daily.    [provider]  pantoprazole (PROTONIX) 40 MG tablet Take 1 tablet (40 mg total) by mouth daily. Patient taking differently: Take 40 mg by mouth daily as needed (acid reflux).  04/21/19   Adrian Saran, MD  spironolactone (ALDACTONE) 25 MG tablet Take 25 mg by mouth daily. 08/13/19   [provider]  lisinopril (PRINIVIL,ZESTRIL) 10 MG  tablet Take 5 mg by mouth daily.   06/10/19  [provider]    Allergies Amoxicillin, Sulfa antibiotics, Ibuprofen, Pravastatin, and Rosuvastatin  No family history on file.  Social History Social History   Tobacco Use  . Smoking status: Current Every Day Smoker    Packs/day: 0.50    Types: Cigarettes  . Smokeless tobacco: Never Used  Substance Use Topics  . Alcohol use: Yes    Comment: rare  . Drug use: No    Review of Systems  Constitutional: No fever/chills Eyes:  No visual changes. ENT: No sore throat. Cardiovascular: Denies chest pain.  History of A. fib. Respiratory: Denies shortness of breath. Gastrointestinal: No abdominal pain.  No nausea, no vomiting.  No diarrhea.  No constipation. Genitourinary: Negative for dysuria. Musculoskeletal: Right knee and ankle pain. Skin: Negative for rash.  Swelling to the right knee. Neurological: Negative for headaches, focal weakness or numbness. Allergic/Immunilogical: Amoxicillin, sulfa antibiotics, ibuprofen, and statins. ____________________________________________   PHYSICAL EXAM:  VITAL SIGNS: ED Triage Vitals  Enc Vitals Group     BP 10/27/19 0921 118/69     Pulse Rate 10/27/19 0921 74     Resp 10/27/19 0921 20     Temp --      Temp src --      SpO2 10/27/19 0921 100 %     Weight 10/27/19 0915 201 lb (91.2 kg)     Height 10/27/19 0915 5\' 8"  (1.727 m)     Head Circumference --      Peak Flow --      Pain Score 10/27/19 0914 0     Pain Loc --      Pain Edu? --      Excl. in [place]? --    Constitutional: Alert and oriented. Well appearing and in no acute distress. Neck: No stridor.  No cervical spine tenderness to palpation. Hematological/Lymphatic/Immunilogical: No cervical lymphadenopathy. Cardiovascular: Normal rate, regular rhythm. Grossly normal heart sounds.  Decreased peripheral pulses of the right foot secondary to edema. Respiratory: Normal respiratory effort.  No retractions. Lungs CTAB. Gastrointestinal: Soft and nontender. No distention. No abdominal bruits. No CVA tenderness. Musculoskeletal: Obvious edema to the right knee.   Neurologic:  Normal speech and language. No gross focal neurologic deficits are appreciated. No gait instability. Skin:  Skin is warm, dry and intact. No rash noted.  No erythema.  It is noticed that the patient had an Ace wrap applied to the lower ankle on a previous visit 4 days ago.  The right foot edematous with some mild ecchymosis on the dorsal aspect of  the foot.   Psychiatric: Mood and affect are normal. Speech and behavior are normal.  ____________________________________________   LABS (all labs ordered are listed, but only abnormal results are displayed)  Labs Reviewed  BASIC METABOLIC PANEL - Abnormal; Notable for the following components:      Result Value   Glucose, Bld 118 (*)    BUN 26 (*)    Calcium 8.6 (*)    All other components within normal limits  CBC WITH DIFFERENTIAL/PLATELET - Abnormal; Notable for the following components:   RBC 3.93 (*)    Hemoglobin 11.6 (*)    HCT 35.6 (*)    RDW 15.9 (*)    All other components within normal limits  SEDIMENTATION RATE - Abnormal; Notable for the following components:   Sed Rate 52 (*)    All other components within normal limits  URIC ACID   ____________________________________________  EKG   ____________________________________________  RADIOLOGY  ED MD interpretation:    Official radiology report(s): Dg Knee 2 Views Right  Result Date: 10/27/2019 CLINICAL DATA:  Right knee swelling. Fall 1 week ago. EXAM: RIGHT KNEE - 1-2 VIEW COMPARISON:  10/16/2019 FINDINGS: No acute fracture, dislocation, or knee joint effusion is identified. No age advanced degenerative changes are seen. Atherosclerotic vascular calcifications are noted. There is new soft tissue swelling diffusely anterior to the knee extending into the lower leg. IMPRESSION: Soft tissue swelling without evidence of acute osseous abnormality or knee joint effusion. Electronically Signed   By: Sebastian AcheAllen  Grady M.D.   On: 10/27/2019 10:09   [place] Venous Img Lower Unilateral Right  Result Date: 10/27/2019 CLINICAL DATA:  Edema EXAM: RIGHT LOWER EXTREMITY VENOUS DOPPLER ULTRASOUND TECHNIQUE: Gray-scale sonography with compression, as well as color and duplex ultrasound, were performed to evaluate the deep venous system from the level of the common femoral vein through the popliteal and proximal calf veins. COMPARISON:   10/20/2019 FINDINGS: Normal compressibility of the common femoral, superficial femoral, and popliteal veins, as well as the proximal calf veins. No filling defects to suggest DVT on grayscale or color Doppler imaging. Doppler waveforms show normal direction of venous flow, normal respiratory phasicity and response to augmentation. Subcutaneous edema in the knee and calf. Survey views of the contralateral common femoral vein are unremarkable. IMPRESSION: No femoropopliteal and no calf DVT in the visualized calf veins. If clinical symptoms are inconsistent or if there are persistent or worsening symptoms, further imaging (possibly involving the iliac veins) may be warranted. Electronically Signed   By: Corlis Leak  Hassell M.D.   On: 10/27/2019 12:45    ____________________________________________   PROCEDURES  Procedure(s) performed (including Critical Care):  Procedures   ____________________________________________   INITIAL IMPRESSION / ASSESSMENT AND PLAN / ED COURSE  As part of my medical decision making, I reviewed the following data within the electronic MEDICAL RECORD NUMBER     Patient presents with right knee pain for 1 week.  Patient state he fell 1 week ago.  Differential consist of occult fracture, joint effusion, DVT.  Physical exam is negative for complaint.  Discussed CT and x-ray findings with patient.  Patient referred to orthopedic for definitive evaluation and treatment.  Discussed rationale for not aspirating the knee at this time due to taking Eliquis.  Patient placed in ankle splint.  Patient given discharge care instruction.    Roger [name] was evaluated in Emergency Department on 10/27/2019 for the symptoms described in the history of present illness. He was evaluated in the context of the global COVID-19 pandemic, which necessitated consideration that the patient might be at risk for infection with the SARS-CoV-2 virus that causes COVID-19. Institutional protocols and algorithms  that pertain to the evaluation of patients at risk for COVID-19 are in a state of rapid change based on information released by regulatory bodies including the CDC and federal and state organizations. These policies and algorithms were followed during the patient's care in the ED.       ____________________________________________   FINAL CLINICAL IMPRESSION(S) / ED DIAGNOSES  Final diagnoses:  Acute pain of right knee     ED Discharge Orders    None       Note:  This document was prepared using Dragon voice recognition software and may include unintentional dictation errors.    Joni ReiningSmith, Ronald K, PA-C 10/27/19 1254    Jene EveryKinner, [place], MD 10/27/19 [phone]

## 2019-10-27 NOTE — ED Triage Notes (Addendum)
Pt here with c/o swollen right knee from a fall a week ago he thinks it has to be drained. Denies pain-states it's just "annoying to be flopping around" Walked with limp to triage.

## 2019-11-04 ENCOUNTER — Other Ambulatory Visit: Payer: Self-pay

## 2019-11-04 ENCOUNTER — Encounter: Payer: Self-pay | Admitting: Intensive Care

## 2019-11-04 ENCOUNTER — Emergency Department
Admission: EM | Admit: 2019-11-04 | Discharge: 2019-11-04 | Disposition: A | Discharge status: Home / Self Care | Payer: Medicare Other | Attending: Emergency Medicine | Admitting: Emergency Medicine

## 2019-11-04 DIAGNOSIS — M25561 Pain in right knee: Secondary | ICD-10-CM | POA: Insufficient documentation

## 2019-11-04 DIAGNOSIS — F17210 Nicotine dependence, cigarettes, uncomplicated: Secondary | ICD-10-CM | POA: Diagnosis not present

## 2019-11-04 DIAGNOSIS — I10 Essential (primary) hypertension: Secondary | ICD-10-CM | POA: Diagnosis not present

## 2019-11-04 DIAGNOSIS — Z79899 Other long term (current) drug therapy: Secondary | ICD-10-CM | POA: Diagnosis not present

## 2019-11-04 DIAGNOSIS — Z7901 Long term (current) use of anticoagulants: Secondary | ICD-10-CM | POA: Diagnosis not present

## 2019-11-04 NOTE — ED Provider Notes (Signed)
[facility] Emergency Department Provider Note   ____________________________________________   First MD Initiated Contact with Patient 11/04/19 1041     (approximate)  I have reviewed the triage vital signs and the nursing notes.   HISTORY  Chief Complaint Leg Swelling (right knee) and Foot Pain (right)   HPI Roger [name] is a 73 y.o. male presents to the ED with complaint of right knee swelling.  Patient states that he was seen by Dr. Martha Clan on Wednesday and fluid was drained.  Patient states that his knee has again swollen up.Marland Kitchen  He is also unable to keep the Ace wrap that was applied at the time in the right position.  He presents to the ED to have his Ace wrap adjusted.  Patient continues to be ambulatory.  He also wants to ask about the postop shoe that he is wearing on his right foot.  Currently he rates his pain a 0/10.     Past Medical History:  Diagnosis Date  . Anemia   . Arthritis   . Atrial fibrillation (HCC)   . Bladder stone   . Dysrhythmia   . GERD (gastroesophageal reflux disease)   . Hypertension     Patient Active Problem List   Diagnosis Date Noted  . Hypotension 09/01/2019  . Ventricular fibrillation (HCC) 04/19/2019    Past Surgical History:  Procedure Laterality Date  . CATARACT EXTRACTION    . CORONARY STENT INTERVENTION N/A 04/20/2019   Procedure: CORONARY STENT INTERVENTION;  Surgeon: Alwyn Pea, MD;  Location: [facility] INVASIVE CV LAB;  Service: Cardiovascular;  Laterality: N/A;  . CYSTOSCOPY WITH LITHOLAPAXY N/A 10/06/2019   Procedure: CYSTOSCOPY WITH LITHOLAPAXY;  Surgeon: Sondra Come, MD;  Location: [facility] ORS;  Service: Urology;  Laterality: N/A;  . HIP SURGERY    . HOLEP-LASER ENUCLEATION OF THE PROSTATE WITH MORCELLATION N/A 10/06/2019   Procedure: HOLEP-LASER ENUCLEATION OF THE PROSTATE WITH MORCELLATION;  Surgeon: Sondra Come, MD;  Location: [facility] ORS;  Service: Urology;  Laterality: N/A;  .  JOINT REPLACEMENT Right 2012   hip right  . LEFT HEART CATH AND CORONARY ANGIOGRAPHY Left 04/20/2019   Procedure: LEFT HEART CATH AND CORONARY ANGIOGRAPHY;  Surgeon: Laurier Nancy, MD;  Location: [facility] INVASIVE CV LAB;  Service: Cardiovascular;  Laterality: Left;    Prior to Admission medications   Medication Sig Start Date End Date Taking? Authorizing Provider  apixaban (ELIQUIS) 2.5 MG TABS tablet Take 2.5 mg by mouth 2 (two) times daily.    [provider]  atorvastatin (LIPITOR) 10 MG tablet Take 10 mg by mouth daily. 05/22/19   [provider]  Cholecalciferol (VITAMIN D3) 1.25 MG (50000 UT) CAPS Take 50,000 Units by mouth every Thursday.  05/02/19   [provider]  ezetimibe (ZETIA) 10 MG tablet Take 10 mg by mouth daily. 08/13/19   [provider]  fluticasone (FLONASE) 50 MCG/ACT nasal spray Place 2 sprays into the nose daily as needed for allergies.  04/08/19 04/07/20  [provider]  furosemide (LASIX) 20 MG tablet Take 20 mg by mouth daily. 09/12/19   [provider]  ketotifen (ZADITOR) 0.025 % ophthalmic solution Place 1 drop into both eyes daily as needed (allergies).    [provider]  metoprolol tartrate (LOPRESSOR) 25 MG tablet Take 25 mg by mouth 2 (two) times daily.    [provider]  pantoprazole (PROTONIX) 40 MG tablet Take 1 tablet (40 mg total) by mouth daily. Patient  taking differently: Take 40 mg by mouth daily as needed (acid reflux).  04/21/19   Adrian SaranMody, Sital, MD  spironolactone (ALDACTONE) 25 MG tablet Take 25 mg by mouth daily. 08/13/19   [provider]  lisinopril (PRINIVIL,ZESTRIL) 10 MG tablet Take 5 mg by mouth daily.   06/10/19  [provider]    Allergies Amoxicillin, Sulfa antibiotics, Ibuprofen, Pravastatin, and Rosuvastatin  History reviewed. No pertinent family history.  Social History Social History   Tobacco Use  . Smoking status: Current Every Day Smoker     Packs/day: 0.50    Types: Cigarettes  . Smokeless tobacco: Never Used  Substance Use Topics  . Alcohol use: Yes    Comment: rare  . Drug use: No    Review of Systems Constitutional: No fever/chills Cardiovascular: Denies chest pain. Respiratory: Denies shortness of breath. Musculoskeletal: Positive for right knee swelling. Skin: Negative for rash. Neurological: Negative for focal weakness or numbness. ____________________________________________   PHYSICAL EXAM:  VITAL SIGNS: ED Triage Vitals  Enc Vitals Group     BP 11/04/19 1027 138/77     Pulse Rate 11/04/19 1027 78     Resp 11/04/19 1027 16     Temp 11/04/19 1027 97.6 F (36.4 C)     Temp Source 11/04/19 1027 Axillary     SpO2 11/04/19 1027 98 %     Weight 11/04/19 1028 205 lb (93 kg)     Height 11/04/19 1028 5\' 8"  (1.727 m)     Head Circumference --      Peak Flow --      Pain Score 11/04/19 1028 0     Pain Loc --      Pain Edu? --      Excl. in [place]? --     Constitutional: Alert and oriented. Well appearing and in no acute distress. Eyes: Conjunctivae are normal. PERRL. EOMI. Head: Atraumatic. Neck: No stridor.   Cardiovascular: Normal rate, regular rhythm. Grossly normal heart sounds.  Good peripheral circulation. Respiratory: Normal respiratory effort.  No retractions. Lungs CTAB. Musculoskeletal: Examination of the right knee there is soft tissue edema noted on the anterior portion of the right knee.  Patient has his Ace wrap in his calf area and appears to be tight.  No erythema or skin abrasions are noted. Neurologic:  Normal speech and language. No gross focal neurologic deficits are appreciated. No gait instability. Skin:  Skin is warm, dry and intact.  Psychiatric: Mood and affect are normal. Speech and behavior are normal.  ____________________________________________   LABS (all labs ordered are listed, but only abnormal results are displayed)  Labs Reviewed - No data to display  ____________________________________________  PROCEDURES  Procedure(s) performed (including Critical Care):  Procedures Patient's Ace wrap was removed.  A Jones wrap was placed to the patient's knee with cushioning and an extra Ace wrap to hopefully help this not slide down.    ____________________________________________   INITIAL IMPRESSION / ASSESSMENT AND PLAN / ED COURSE  As part of my medical decision making, I reviewed the following data within the electronic MEDICAL RECORD NUMBER Notes from prior ED visits and [place] Controlled Substance Database  73 year old male presents to the ED with complaint of right knee swelling and morning his Ace wrap replaced as it keeps sliding down his leg.  He was seen by orthopedics on Wednesday at which time they aspirated fluid from his knee and also injected steroids. No infection is noted. There is generalized soft tissue edema and patient is  on Eliquis. Ace wrap was removed from patient's lower leg and repositioned along with Webril to help cushion his leg. He was made aware that if this becomes tight he should remove it and loosen the wrapping up. He was also encouraged to elevate his leg frequently especially when he is watching TV. He will call the orthopedist on Monday for further instructions. He also was told to return to the emergency department if any severe worsening of his symptoms during the holiday weekend.  ____________________________________________   FINAL CLINICAL IMPRESSION(S) / ED DIAGNOSES  Final diagnoses:  Acute pain of right knee     ED Discharge Orders    None       Note:  This document was prepared using Dragon voice recognition software and may include unintentional dictation errors.    Johnn Hai, PA-C 11/04/19 1522    Lavonia Drafts, MD 11/05/19 1556

## 2019-11-04 NOTE — ED Notes (Signed)
See triage note  Presents with swelling to right knee  States he had his knee drained at [facility] on weds and had ace wrap reapplied    But knee is still swollen  Also had a splint placed on ankle  States he wants to know if he can removed the splint  Ambulates well to treatment area

## 2019-11-04 NOTE — ED Triage Notes (Signed)
Patient has fluid on right knee that was drained recently and has filled back up with fluid. Patient also was recently placed in brace for right foot and wants his foot checked to see if brace can come off. Ambulatory into triage with no problems

## 2019-11-04 NOTE — Discharge Instructions (Addendum)
Call Dr. Harden Mo office on Monday to let them know that you were seen in the emergency department and if you are having continued problems with your knee.  Continue your regular medication.  Ice and elevate your knee frequently especially the elevation.  Wear Ace wrap above your knee.  If it is sliding down you will have to readjust it.

## 2019-11-09 ENCOUNTER — Emergency Department: Payer: Medicare Other

## 2019-11-09 ENCOUNTER — Emergency Department
Admission: EM | Admit: 2019-11-09 | Discharge: 2019-11-09 | Disposition: A | Discharge status: Home / Self Care | Payer: Medicare Other | Attending: Emergency Medicine | Admitting: Emergency Medicine

## 2019-11-09 ENCOUNTER — Encounter: Payer: Self-pay | Admitting: Emergency Medicine

## 2019-11-09 ENCOUNTER — Other Ambulatory Visit: Payer: Self-pay

## 2019-11-09 DIAGNOSIS — W228XXA Striking against or struck by other objects, initial encounter: Secondary | ICD-10-CM | POA: Diagnosis not present

## 2019-11-09 DIAGNOSIS — F17210 Nicotine dependence, cigarettes, uncomplicated: Secondary | ICD-10-CM | POA: Insufficient documentation

## 2019-11-09 DIAGNOSIS — Y999 Unspecified external cause status: Secondary | ICD-10-CM | POA: Diagnosis not present

## 2019-11-09 DIAGNOSIS — Y939 Activity, unspecified: Secondary | ICD-10-CM | POA: Insufficient documentation

## 2019-11-09 DIAGNOSIS — S6992XA Unspecified injury of left wrist, hand and finger(s), initial encounter: Secondary | ICD-10-CM | POA: Diagnosis present

## 2019-11-09 DIAGNOSIS — Z7901 Long term (current) use of anticoagulants: Secondary | ICD-10-CM | POA: Insufficient documentation

## 2019-11-09 DIAGNOSIS — Z79899 Other long term (current) drug therapy: Secondary | ICD-10-CM | POA: Diagnosis not present

## 2019-11-09 DIAGNOSIS — I10 Essential (primary) hypertension: Secondary | ICD-10-CM | POA: Diagnosis not present

## 2019-11-09 DIAGNOSIS — S60222A Contusion of left hand, initial encounter: Secondary | ICD-10-CM | POA: Diagnosis not present

## 2019-11-09 DIAGNOSIS — Y929 Unspecified place or not applicable: Secondary | ICD-10-CM | POA: Insufficient documentation

## 2019-11-09 DIAGNOSIS — T1490XA Injury, unspecified, initial encounter: Secondary | ICD-10-CM

## 2019-11-09 NOTE — ED Notes (Signed)
Pt signed physical discharge form. 

## 2019-11-09 NOTE — ED Provider Notes (Signed)
[facility] Emergency Department Provider Note   ____________________________________________    I have reviewed the triage vital signs and the nursing notes.   HISTORY  Chief Complaint Hand Injury     HPI Roger [name] is a 73 y.o. male who presents with complaints of a left hand injury.  Patient reports he is on Coumadin for atrial fibrillation, he bumped his hand against a hard object approximately 2 hours ago, he noticed a small knot of bruising there and became concerned.  No other complaints  Past Medical History:  Diagnosis Date  . Anemia   . Arthritis   . Atrial fibrillation ([place])   . Bladder stone   . Dysrhythmia   . GERD (gastroesophageal reflux disease)   . Hypertension     Patient Active Problem List   Diagnosis Date Noted  . Hypotension 09/01/2019  . Ventricular fibrillation ([place]) 04/19/2019    Past Surgical History:  Procedure Laterality Date  . CATARACT EXTRACTION    . CORONARY STENT INTERVENTION N/A 04/20/2019   Procedure: CORONARY STENT INTERVENTION;  Surgeon: Yolonda Kida, MD;  Location: [place] [facility];  Service: Cardiovascular;  Laterality: N/A;  . CYSTOSCOPY WITH LITHOLAPAXY N/A 10/06/2019   Procedure: CYSTOSCOPY WITH LITHOLAPAXY;  Surgeon: Billey Co, MD;  Location: [facility];  Service: Urology;  Laterality: N/A;  . HIP SURGERY    . HOLEP-LASER ENUCLEATION OF THE PROSTATE WITH MORCELLATION N/A 10/06/2019   Procedure: HOLEP-LASER ENUCLEATION OF THE PROSTATE WITH MORCELLATION;  Surgeon: Billey Co, MD;  Location: [facility];  Service: Urology;  Laterality: N/A;  . JOINT REPLACEMENT Right 2012   hip right  . LEFT HEART CATH AND CORONARY ANGIOGRAPHY Left 04/20/2019   Procedure: LEFT HEART CATH AND CORONARY ANGIOGRAPHY;  Surgeon: Dionisio David, MD;  Location: [place] [facility];  Service: Cardiovascular;  Laterality: Left;    Prior to Admission medications   Medication Sig Start Date End Date  Taking? Authorizing Provider  apixaban (ELIQUIS) 2.5 MG TABS tablet Take 2.5 mg by mouth 2 (two) times daily.    [provider]  atorvastatin (LIPITOR) 10 MG tablet Take 10 mg by mouth daily. 05/22/19   [provider]  Cholecalciferol (VITAMIN D3) 1.25 MG (50000 UT) CAPS Take 50,000 Units by mouth every Thursday.  05/02/19   [provider]  ezetimibe (ZETIA) 10 MG tablet Take 10 mg by mouth daily. 08/13/19   [provider]  fluticasone (FLONASE) 50 MCG/ACT nasal spray Place 2 sprays into the nose daily as needed for allergies.  04/08/19 04/07/20  [provider]  furosemide (LASIX) 20 MG tablet Take 20 mg by mouth daily. 09/12/19   [provider]  ketotifen (ZADITOR) 0.025 % ophthalmic solution Place 1 drop into both eyes daily as needed (allergies).    [provider]  metoprolol tartrate (LOPRESSOR) 25 MG tablet Take 25 mg by mouth 2 (two) times daily.    [provider]  pantoprazole (PROTONIX) 40 MG tablet Take 1 tablet (40 mg total) by mouth daily. Patient taking differently: Take 40 mg by mouth daily as needed (acid reflux).  04/21/19   Bettey Costa, MD  spironolactone (ALDACTONE) 25 MG tablet Take 25 mg by mouth daily. 08/13/19   [provider]  lisinopril (PRINIVIL,ZESTRIL) 10 MG tablet Take 5 mg by mouth daily.   06/10/19  [provider]     Allergies Amoxicillin, Sulfa antibiotics, Ibuprofen, Pravastatin, and Rosuvastatin  No family history on  file.  Social History Social History   Tobacco Use  . Smoking status: Current Every Day Smoker    Packs/day: 0.50    Types: Cigarettes  . Smokeless tobacco: Never Used  Substance Use Topics  . Alcohol use: Yes    Comment: rare  . Drug use: No    Review of Systems          Musculoskeletal: As above Skin: No laceration Neurological: Negative for numbness or tingling    ____________________________________________   PHYSICAL EXAM:   VITAL SIGNS: ED Triage Vitals  Enc Vitals Group     BP 11/09/19 1916 124/73     Pulse Rate 11/09/19 1916 85     Resp 11/09/19 1916 19     Temp 11/09/19 1916 98.9 F (37.2 C)     Temp Source 11/09/19 1916 Oral     SpO2 11/09/19 1916 99 %     Weight 11/09/19 1853 93 kg (205 lb 0.4 oz)     Height --      Head Circumference --      Peak Flow --      Pain Score 11/09/19 1853 0     Pain Loc --      Pain Edu? --      Excl. in GC? --      Constitutional: Alert and oriented.  Mouth/Throat: Mucous membranes are moist.   Cardiovascular: Normal rate, regular rhythm.  Respiratory: Normal respiratory effort.  No retractions.  Musculoskeletal: Left hand: Approximately 1 cm bruise to the dorsal aspect of the lateral hand, no bony normalities, no active bleeding, no laceration Neurologic:  Normal speech and language. No gross focal neurologic deficits are appreciated.   Skin:  Skin is warm, dry, as above   ____________________________________________   LABS (all labs ordered are listed, but only abnormal results are displayed)  Labs Reviewed - No data to display ____________________________________________  EKG   ____________________________________________  RADIOLOGY  Hand xray unremarkable ____________________________________________   PROCEDURES  Procedure(s) performed: No  Procedures   Critical Care performed: No ____________________________________________   INITIAL IMPRESSION / ASSESSMENT AND PLAN / ED COURSE  Pertinent labs & imaging results that were available during my care of the patient were reviewed by me and considered in my medical decision making (see chart for details).  Patient with reassuring hand, small bruise.   ____________________________________________   FINAL CLINICAL IMPRESSION(S) / ED DIAGNOSES  Final diagnoses:  Contusion of left hand, initial encounter      NEW MEDICATIONS STARTED DURING THIS VISIT:  New Prescriptions    No medications on file     Note:  This document was prepared using Dragon voice recognition software and may include unintentional dictation errors.   Jene Every, MD 11/09/19 2014

## 2019-11-09 NOTE — ED Triage Notes (Signed)
First Nurse Note:  Patient arrives stating he hit his right hand on something and because he takes coumadin, he wanted to get checked to make sure he was ok.  Small area of bruising seen to left hand.  Patient is AAOx3.  Skin warm and dry. NAD

## 2019-11-10 ENCOUNTER — Emergency Department: Payer: Medicare Other

## 2019-11-10 ENCOUNTER — Encounter: Payer: Self-pay | Admitting: Emergency Medicine

## 2019-11-10 ENCOUNTER — Emergency Department
Admission: EM | Admit: 2019-11-10 | Discharge: 2019-11-10 | Disposition: A | Discharge status: Home / Self Care | Payer: Medicare Other | Attending: Emergency Medicine | Admitting: Emergency Medicine

## 2019-11-10 DIAGNOSIS — Z96641 Presence of right artificial hip joint: Secondary | ICD-10-CM | POA: Diagnosis not present

## 2019-11-10 DIAGNOSIS — Z955 Presence of coronary angioplasty implant and graft: Secondary | ICD-10-CM | POA: Diagnosis not present

## 2019-11-10 DIAGNOSIS — Z7901 Long term (current) use of anticoagulants: Secondary | ICD-10-CM | POA: Diagnosis not present

## 2019-11-10 DIAGNOSIS — I10 Essential (primary) hypertension: Secondary | ICD-10-CM | POA: Insufficient documentation

## 2019-11-10 DIAGNOSIS — Z79899 Other long term (current) drug therapy: Secondary | ICD-10-CM | POA: Diagnosis not present

## 2019-11-10 DIAGNOSIS — R0789 Other chest pain: Secondary | ICD-10-CM | POA: Diagnosis present

## 2019-11-10 DIAGNOSIS — F17210 Nicotine dependence, cigarettes, uncomplicated: Secondary | ICD-10-CM | POA: Diagnosis not present

## 2019-11-10 LAB — BASIC METABOLIC PANEL
Anion gap: 10 (ref 5–15)
BUN: 37 mg/dL — ABNORMAL HIGH (ref 8–23)
CO2: 27 mmol/L (ref 22–32)
Calcium: 8.8 mg/dL — ABNORMAL LOW (ref 8.9–10.3)
Chloride: 102 mmol/L (ref 98–111)
Creatinine, Ser: 1.24 mg/dL (ref 0.61–1.24)
GFR calc Af Amer: >60 mL/min (ref >60)
GFR calc non Af Amer: 58 mL/min — ABNORMAL LOW (ref >60)
Glucose, Bld: 100 mg/dL — ABNORMAL HIGH (ref 70–99)
Potassium: 4.1 mmol/L (ref 3.5–5.1)
Sodium: 139 mmol/L (ref 135–145)

## 2019-11-10 LAB — CBC
HCT: 39.2 % (ref 39.0–52.0)
Hemoglobin: 12.4 g/dL — ABNORMAL LOW (ref 13.0–17.0)
MCH: 29.3 pg (ref 26.0–34.0)
MCHC: 31.6 g/dL (ref 30.0–36.0)
MCV: 92.7 fL (ref 80.0–100.0)
Platelets: 293 10*3/uL (ref 150–400)
RBC: 4.23 MIL/uL (ref 4.22–5.81)
RDW: 16.1 % — ABNORMAL HIGH (ref 11.5–15.5)
WBC: 10.5 10*3/uL (ref 4.0–10.5)
nRBC: 0 % (ref 0.0–0.2)

## 2019-11-10 LAB — TROPONIN I (HIGH SENSITIVITY): Troponin I (High Sensitivity): 7 ng/L (ref <18)

## 2019-11-10 NOTE — ED Notes (Signed)
Pt alert and oriented X 4, stable for discharge. RR even and unlabored, color WNL. Discussed discharge instructions and follow up when appropriate. Instructed to follow up with ER for any life threatening symptoms or concerns that patient or family of patient may have  

## 2019-11-10 NOTE — ED Triage Notes (Signed)
Patient to ER for c/o chest pain to left chest. Patient states he had sudden onset of feeling like "someone grabbed my heart and squeezed it". H/O MI, on Eliquis.

## 2019-11-10 NOTE — ED Notes (Signed)
See triage note  Presents with left sided chest pain   States sudden onset  "grabbing Type " pain

## 2019-11-10 NOTE — ED Provider Notes (Signed)
[facility] Emergency Department Provider Note   ____________________________________________    I have reviewed the triage vital signs and the nursing notes.   HISTORY  Chief Complaint Chest Pain     HPI Roger [name] is a 73 y.o. male with history of atrial fibrillation on blood thinners who reports that 7:00 this morning he felt a 2-second twinge of discomfort in his left breast.  He has not had it since.  He has no shortness of breath.  No chest pressure.  No nausea vomiting or diaphoresis.  Feels quite well and has no complaints.  He does see Dr. Welton Flakes of cardiology.  No fevers or chills.  Seen by me yesterday for unrelated complaint  Past Medical History:  Diagnosis Date  . Anemia   . Arthritis   . Atrial fibrillation ([facility])   . Bladder stone   . Dysrhythmia   . GERD (gastroesophageal reflux disease)   . Hypertension     Patient Active Problem List   Diagnosis Date Noted  . Hypotension 09/01/2019  . Ventricular fibrillation ([facility]) 04/19/2019    Past Surgical History:  Procedure Laterality Date  . CATARACT EXTRACTION    . CORONARY STENT INTERVENTION N/A 04/20/2019   Procedure: CORONARY STENT INTERVENTION;  Surgeon: Alwyn Pea, MD;  Location: [facility];  Service: Cardiovascular;  Laterality: N/A;  . CYSTOSCOPY WITH LITHOLAPAXY N/A 10/06/2019   Procedure: CYSTOSCOPY WITH LITHOLAPAXY;  Surgeon: Sondra Come, MD;  Location: [facility];  Service: Urology;  Laterality: N/A;  . HIP SURGERY    . HOLEP-LASER ENUCLEATION OF THE PROSTATE WITH MORCELLATION N/A 10/06/2019   Procedure: HOLEP-LASER ENUCLEATION OF THE PROSTATE WITH MORCELLATION;  Surgeon: Sondra Come, MD;  Location: [facility];  Service: Urology;  Laterality: N/A;  . JOINT REPLACEMENT Right 2012   hip right  . LEFT HEART CATH AND CORONARY ANGIOGRAPHY Left 04/20/2019   Procedure: LEFT HEART CATH AND CORONARY ANGIOGRAPHY;  Surgeon: Laurier Nancy, MD;  Location:  [facility];  Service: Cardiovascular;  Laterality: Left;    Prior to Admission medications   Medication Sig Start Date End Date Taking? Authorizing Provider  apixaban (ELIQUIS) 2.5 MG TABS tablet Take 2.5 mg by mouth 2 (two) times daily.    [provider]  atorvastatin (LIPITOR) 10 MG tablet Take 10 mg by mouth daily. 05/22/19   [provider]  Cholecalciferol (VITAMIN D3) 1.25 MG (50000 UT) CAPS Take 50,000 Units by mouth every Thursday.  05/02/19   [provider]  ezetimibe (ZETIA) 10 MG tablet Take 10 mg by mouth daily. 08/13/19   [provider]  fluticasone (FLONASE) 50 MCG/ACT nasal spray Place 2 sprays into the nose daily as needed for allergies.  04/08/19 04/07/20  [provider]  furosemide (LASIX) 20 MG tablet Take 20 mg by mouth daily. 09/12/19   [provider]  ketotifen (ZADITOR) 0.025 % ophthalmic solution Place 1 drop into both eyes daily as needed (allergies).    [provider]  metoprolol tartrate (LOPRESSOR) 25 MG tablet Take 25 mg by mouth 2 (two) times daily.    [provider]  pantoprazole (PROTONIX) 40 MG tablet Take 1 tablet (40 mg total) by mouth daily. Patient taking differently: Take 40 mg by mouth daily as needed (acid reflux).  04/21/19   Adrian Saran, MD  spironolactone (ALDACTONE) 25 MG tablet Take 25 mg by mouth daily. 08/13/19   [provider]  lisinopril (PRINIVIL,ZESTRIL) 10  MG tablet Take 5 mg by mouth daily.   06/10/19  [provider]     Allergies Amoxicillin, Sulfa antibiotics, Ibuprofen, Pravastatin, and Rosuvastatin  No family history on file.  Social History Social History   Tobacco Use  . Smoking status: Current Every Day Smoker    Packs/day: 0.50    Types: Cigarettes  . Smokeless tobacco: Never Used  Substance Use Topics  . Alcohol use: Yes    Comment: rare  . Drug use: No    Review of Systems  Constitutional: No fever/chills Eyes: No visual  changes.  ENT: No sore throat. Cardiovascular: As above Respiratory: As above Gastrointestinal: No abdominal pain.  No nausea, no vomiting.   Genitourinary: Negative for dysuria. Musculoskeletal: Negative for back pain. Skin: Negative for rash. Neurological: Negative for headaches    ____________________________________________   PHYSICAL EXAM:  VITAL SIGNS: ED Triage Vitals [11/10/19 0958]  Enc Vitals Group     BP 127/67     Pulse Rate 83     Resp 20     Temp 98.2 F (36.8 C)     Temp Source Oral     SpO2 99 %     Weight 93 kg (205 lb 0.4 oz)     Height 1.727 m (5\' 8" )     Head Circumference      Peak Flow      Pain Score 0     Pain Loc      Pain Edu?      Excl. in [place]?     Constitutional: Alert and oriented. No acute distress. Pleasant and interactive  Nose: No congestion/rhinnorhea. Mouth/Throat: Mucous membranes are moist.    Cardiovascular: Normal rate, irregular rhythm. Grossly normal heart sounds.  Good peripheral circulation.  Patient with point tenderness to the lateral aspect of the left breast, suspicious for musculoskeletal pain, no rash or bruising Respiratory: Normal respiratory effort.  No retractions. Lungs CTAB. Gastrointestinal: Soft and nontender. No distention.   Musculoskeletal: No lower extremity tenderness nor edema.  Warm and well perfused Neurologic:  Normal speech and language. No gross focal neurologic deficits are appreciated.  Skin:  Skin is warm, dry and intact. No rash noted. Psychiatric: Mood and affect are normal. Speech and behavior are normal.  ____________________________________________   LABS (all labs ordered are listed, but only abnormal results are displayed)  Labs Reviewed  BASIC METABOLIC PANEL - Abnormal; Notable for the following components:      Result Value   Glucose, Bld 100 (*)    BUN 37 (*)    Calcium 8.8 (*)    GFR calc non Af Amer 58 (*)    All other components within normal limits  CBC - Abnormal;  Notable for the following components:   Hemoglobin 12.4 (*)    RDW 16.1 (*)    All other components within normal limits  TROPONIN I (HIGH SENSITIVITY)   ____________________________________________  EKG  ED ECG REPORT I, Lavonia Drafts, the attending physician, personally viewed and interpreted this ECG.  Date: 11/10/2019  Rhythm: Atrial fibrillation QRS Axis: normal Intervals: Left bundle branch block, old ST/T Wave abnormalities: normal Narrative Interpretation: no evidence of acute ischemia  ____________________________________________  RADIOLOGY  Chest x-ray unremarkable ____________________________________________   PROCEDURES  Procedure(s) performed: No  Procedures   Critical Care performed: No ____________________________________________   INITIAL IMPRESSION / ASSESSMENT AND PLAN / ED COURSE  Pertinent labs & imaging results that were available during my care of the patient were reviewed by me  and considered in my medical decision making (see chart for details).  Patient presents with brief episode of pain as described above, not consistent with ACS dissection PE.  Suspect musculoskeletal pain as point tenderness the lateral pectoralis muscle, troponin unchanged from prior, EKG unchanged from prior, chest x-ray normal.  Patient asymptomatic well-appearing.  Appropriate for follow-up with PCP/cardiology as needed, return precautions discussed    ____________________________________________   FINAL CLINICAL IMPRESSION(S) / ED DIAGNOSES  Final diagnoses:  Atypical chest pain        Note:  This document was prepared using Dragon voice recognition software and may include unintentional dictation errors.   Jene EveryKinner, Raj, MD 11/10/19 [phone]

## 2019-11-11 ENCOUNTER — Emergency Department
Admission: EM | Admit: 2019-11-11 | Discharge: 2019-11-11 | Disposition: A | Discharge status: Home / Self Care | Payer: Medicare Other | Attending: Emergency Medicine | Admitting: Emergency Medicine

## 2019-11-11 ENCOUNTER — Other Ambulatory Visit: Payer: Self-pay

## 2019-11-11 ENCOUNTER — Encounter: Payer: Self-pay | Admitting: Emergency Medicine

## 2019-11-11 DIAGNOSIS — I10 Essential (primary) hypertension: Secondary | ICD-10-CM | POA: Insufficient documentation

## 2019-11-11 DIAGNOSIS — F17210 Nicotine dependence, cigarettes, uncomplicated: Secondary | ICD-10-CM | POA: Insufficient documentation

## 2019-11-11 DIAGNOSIS — R040 Epistaxis: Secondary | ICD-10-CM | POA: Insufficient documentation

## 2019-11-11 DIAGNOSIS — Z96641 Presence of right artificial hip joint: Secondary | ICD-10-CM | POA: Insufficient documentation

## 2019-11-11 DIAGNOSIS — Z79899 Other long term (current) drug therapy: Secondary | ICD-10-CM | POA: Diagnosis not present

## 2019-11-11 DIAGNOSIS — Z7901 Long term (current) use of anticoagulants: Secondary | ICD-10-CM | POA: Diagnosis not present

## 2019-11-11 MED ORDER — OXYMETAZOLINE HCL 0.05 % NA SOLN
1.0000 | Freq: Once | NASAL | Status: AC
  Administered 2019-11-11: 1 via NASAL
  Filled 2019-11-11: qty 30

## 2019-11-11 NOTE — ED Triage Notes (Signed)
FIRST NURSE NOTE-here for nose bleed. On blood thinners. Minimal bleeding at this time.

## 2019-11-11 NOTE — ED Provider Notes (Signed)
[facility] Emergency Department Provider Note  ____________________________________________   First MD Initiated Contact with Patient 11/11/19 1403     (approximate)  I have reviewed the triage vital signs and the nursing notes.   HISTORY  Chief Complaint Epistaxis    HPI PRANEETH BUSSEY is a 73 y.o. male with Afib on eliquis who presents with nose bleed.  Patient states that 10 years ago he had a nosebleed that he had to have cauterized.  He had had no nosebleed on the other nostril that was cauterized as well 5 years ago.  He endorses using flonase over the past few days which he thinks dried out his nostril.  He then developed a little of bleeding, intermittently, out of both nasal passages, only a few drops on the tissue papers, mild, stopped on its own. No active bleeding now.     Past Medical History:  Diagnosis Date  . Anemia   . Arthritis   . Atrial fibrillation ([place])   . Bladder stone   . Dysrhythmia   . GERD (gastroesophageal reflux disease)   . Hypertension     Patient Active Problem List   Diagnosis Date Noted  . Hypotension 09/01/2019  . Ventricular fibrillation ([place]) 04/19/2019    Past Surgical History:  Procedure Laterality Date  . CATARACT EXTRACTION    . CORONARY STENT INTERVENTION N/A 04/20/2019   Procedure: CORONARY STENT INTERVENTION;  Surgeon: Yolonda Kida, MD;  Location: [place] [facility];  Service: Cardiovascular;  Laterality: N/A;  . CYSTOSCOPY WITH LITHOLAPAXY N/A 10/06/2019   Procedure: CYSTOSCOPY WITH LITHOLAPAXY;  Surgeon: Billey Co, MD;  Location: [facility];  Service: Urology;  Laterality: N/A;  . HIP SURGERY    . HOLEP-LASER ENUCLEATION OF THE PROSTATE WITH MORCELLATION N/A 10/06/2019   Procedure: HOLEP-LASER ENUCLEATION OF THE PROSTATE WITH MORCELLATION;  Surgeon: Billey Co, MD;  Location: [facility];  Service: Urology;  Laterality: N/A;  . JOINT REPLACEMENT Right 2012   hip right  . LEFT  HEART CATH AND CORONARY ANGIOGRAPHY Left 04/20/2019   Procedure: LEFT HEART CATH AND CORONARY ANGIOGRAPHY;  Surgeon: Dionisio David, MD;  Location: [place] [facility];  Service: Cardiovascular;  Laterality: Left;    Prior to Admission medications   Medication Sig Start Date End Date Taking? Authorizing Provider  apixaban (ELIQUIS) 2.5 MG TABS tablet Take 2.5 mg by mouth 2 (two) times daily.    [provider]  atorvastatin (LIPITOR) 10 MG tablet Take 10 mg by mouth daily. 05/22/19   [provider]  Cholecalciferol (VITAMIN D3) 1.25 MG (50000 UT) CAPS Take 50,000 Units by mouth every Thursday.  05/02/19   [provider]  ezetimibe (ZETIA) 10 MG tablet Take 10 mg by mouth daily. 08/13/19   [provider]  fluticasone (FLONASE) 50 MCG/ACT nasal spray Place 2 sprays into the nose daily as needed for allergies.  04/08/19 04/07/20  [provider]  furosemide (LASIX) 20 MG tablet Take 20 mg by mouth daily. 09/12/19   [provider]  ketotifen (ZADITOR) 0.025 % ophthalmic solution Place 1 drop into both eyes daily as needed (allergies).    [provider]  metoprolol tartrate (LOPRESSOR) 25 MG tablet Take 25 mg by mouth 2 (two) times daily.    [provider]  pantoprazole (PROTONIX) 40 MG tablet Take 1 tablet (40 mg total) by mouth daily. Patient taking differently: Take 40 mg by mouth daily as needed (acid reflux).  04/21/19  Adrian SaranMody, Sital, MD  spironolactone (ALDACTONE) 25 MG tablet Take 25 mg by mouth daily. 08/13/19   [provider]  lisinopril (PRINIVIL,ZESTRIL) 10 MG tablet Take 5 mg by mouth daily.   06/10/19  [provider]    Allergies Amoxicillin, Sulfa antibiotics, Ibuprofen, Pravastatin, and Rosuvastatin  History reviewed. No pertinent family history.  Social History Social History   Tobacco Use  . Smoking status: Current Every Day Smoker    Packs/day: 0.50    Types: Cigarettes  . Smokeless  tobacco: Never Used  Substance Use Topics  . Alcohol use: Yes    Comment: rare  . Drug use: No      Review of Systems Constitutional: No fever/chills Eyes: No visual changes. ENT: No sore throat.  Positive bloody nose Cardiovascular: Denies chest pain. Respiratory: Denies shortness of breath. Gastrointestinal: No abdominal pain.  No nausea, no vomiting.  No diarrhea.  No constipation. Genitourinary: Negative for dysuria. Musculoskeletal: Negative for back pain. Skin: Negative for rash. Neurological: Negative for headaches, focal weakness or numbness. All other ROS negative ____________________________________________   PHYSICAL EXAM:  VITAL SIGNS: ED Triage Vitals  Enc Vitals Group     BP 11/11/19 1357 124/67     Pulse Rate 11/11/19 1357 79     Resp 11/11/19 1357 16     Temp 11/11/19 1357 98.1 F (36.7 C)     Temp Source 11/11/19 1357 Oral     SpO2 11/11/19 1357 100 %     Weight 11/11/19 1355 205 lb 0.4 oz (93 kg)     Height 11/11/19 1355 5\' 8"  (1.727 m)     Head Circumference --      Peak Flow --      Pain Score 11/11/19 1355 0     Pain Loc --      Pain Edu? --      Excl. in GC? --     Constitutional: Alert and oriented. Well appearing and in no acute distress. Eyes: Conjunctivae are normal. EOMI. Head: Atraumatic. Nose: No active bleeding, some dried clots. Mouth/Throat: Mucous membranes are moist.  OP clear.  No blood oropharynx. Neck: No stridor. Trachea Midline. FROM Cardiovascular: Normal rate, regular rhythm. Grossly normal heart sounds.  Good peripheral circulation. Respiratory: Normal respiratory effort.  No retractions. Lungs CTAB. Gastrointestinal: Soft and nontender. No distention. No abdominal bruits.  Musculoskeletal: No lower extremity tenderness nor edema.  No joint effusions. Neurologic:  Normal speech and language. No gross focal neurologic deficits are appreciated.  Skin:  Skin is warm, dry and intact. No rash noted. Psychiatric: Mood and  affect are normal. Speech and behavior are normal. GU: Deferred   ____________________________________________   PROCEDURES  Procedure(s) performed (including Critical Care):  Procedures   ____________________________________________   INITIAL IMPRESSION / ASSESSMENT AND PLAN / ED COURSE  Patricia PesaRobert E Latulippe was evaluated in Emergency Department on 11/11/2019 for the symptoms described in the history of present illness. He was evaluated in the context of the global COVID-19 pandemic, which necessitated consideration that the patient might be at risk for infection with the SARS-CoV-2 virus that causes COVID-19. Institutional protocols and algorithms that pertain to the evaluation of patients at risk for COVID-19 are in a state of rapid change based on information released by regulatory bodies including the CDC and federal and state organizations. These policies and algorithms were followed during the patient's care in the ED.    Patient is a 73 year old who presents with bilateral nasal bleeding.  His bleeding has  now stopped.  It was only very minimal amount he showed me the tissue paper and it was just a few spots of blood on it.  He was not pouring out blood at any moment.  He already had labs done yesterday that showed no evidence of anemia, platelet dysfunction given the very minimal bleeding I do not think we need to repeat his labs today.  I did discuss with patient he is okay with holding off.  He is on a blood thinner but he has been taking his blood thinner as prescribed and again the bleeding was very minimal in nature and there is no active bleeding at this time.  Discussed with patient avoiding the Flonase given it most likely dried out his nasal passages and caused the bleed.  We also discussed management of nasal bleeds with compression of his nose as well as have phenylephrine to use only if severe.  We discussed not using it too much because it can cause issues with his blood  pressure.  Patient expressed understanding.  Will give patient ENT number to follow-up with in case he does not have his old ENT.  Patient felt comfortable with the situation.  Patient is been in the ER for an hour and a half without any recurrent bleed   _________________________________________   FINAL CLINICAL IMPRESSION(S) / ED DIAGNOSES   Final diagnoses:  Epistaxis      MEDICATIONS GIVEN DURING THIS VISIT:  Medications  oxymetazoline (AFRIN) 0.05 % nasal spray 1 spray (1 spray Each Nare Given 11/11/19 1514)     ED Discharge Orders    None       Note:  This document was prepared using Dragon voice recognition software and may include unintentional dictation errors.   Concha Se, MD 11/11/19 [phone]

## 2019-11-11 NOTE — ED Triage Notes (Signed)
Here for nose bleed that started this morning. Pt only reports bleeding from left nare.  Few small spots of blood on tissue. No visible active bleeding at this time. On eliquis.  Hx of same. Normally can be cauterized with silver nitrate.

## 2019-11-11 NOTE — Discharge Instructions (Signed)
If you develop a little spotting just continue to watch it.  If you develop more severe bleeding hold compression and do 1 squirt of afrin.  If after a few minutes is not getting better he should return to the ER for further intervention given you on a blood thinner.  You can follow-up with ENT as needed.  Avoid the Flonase spray.  Return to the ER for any other concerns

## 2019-11-11 NOTE — ED Notes (Signed)
E-signature not working at this time. Pt verbalized understanding of D/C instructions, prescriptions and follow up care with no further questions at this time. Pt in NAD and ambulatory at time of D/C.  

## 2019-11-11 NOTE — ED Notes (Signed)
MD Jari Pigg verbal order to hold on lab draw due to pt having labs drawn yesterday.

## 2019-11-13 ENCOUNTER — Other Ambulatory Visit: Payer: Self-pay

## 2019-11-13 ENCOUNTER — Encounter: Payer: Self-pay | Admitting: Emergency Medicine

## 2019-11-13 ENCOUNTER — Emergency Department
Admission: EM | Admit: 2019-11-13 | Discharge: 2019-11-13 | Disposition: A | Discharge status: Home / Self Care | Payer: Medicare Other | Attending: Emergency Medicine | Admitting: Emergency Medicine

## 2019-11-13 DIAGNOSIS — I10 Essential (primary) hypertension: Secondary | ICD-10-CM | POA: Insufficient documentation

## 2019-11-13 DIAGNOSIS — Z7901 Long term (current) use of anticoagulants: Secondary | ICD-10-CM | POA: Insufficient documentation

## 2019-11-13 DIAGNOSIS — Z96641 Presence of right artificial hip joint: Secondary | ICD-10-CM | POA: Diagnosis not present

## 2019-11-13 DIAGNOSIS — F17210 Nicotine dependence, cigarettes, uncomplicated: Secondary | ICD-10-CM | POA: Diagnosis not present

## 2019-11-13 DIAGNOSIS — Z79899 Other long term (current) drug therapy: Secondary | ICD-10-CM | POA: Insufficient documentation

## 2019-11-13 DIAGNOSIS — R42 Dizziness and giddiness: Secondary | ICD-10-CM

## 2019-11-13 LAB — COMPREHENSIVE METABOLIC PANEL
ALT: 10 U/L (ref 0–44)
AST: 12 U/L — ABNORMAL LOW (ref 15–41)
Albumin: 3.7 g/dL (ref 3.5–5.0)
Alkaline Phosphatase: 78 U/L (ref 38–126)
Anion gap: 9 (ref 5–15)
BUN: 29 mg/dL — ABNORMAL HIGH (ref 8–23)
CO2: 27 mmol/L (ref 22–32)
Calcium: 9.1 mg/dL (ref 8.9–10.3)
Chloride: 103 mmol/L (ref 98–111)
Creatinine, Ser: 1.15 mg/dL (ref 0.61–1.24)
GFR calc Af Amer: >60 mL/min (ref >60)
GFR calc non Af Amer: >60 mL/min (ref >60)
Glucose, Bld: 147 mg/dL — ABNORMAL HIGH (ref 70–99)
Potassium: 3.9 mmol/L (ref 3.5–5.1)
Sodium: 139 mmol/L (ref 135–145)
Total Bilirubin: 1.5 mg/dL — ABNORMAL HIGH (ref 0.3–1.2)
Total Protein: 7 g/dL (ref 6.5–8.1)

## 2019-11-13 LAB — CBC
HCT: 41.4 % (ref 39.0–52.0)
Hemoglobin: 12.9 g/dL — ABNORMAL LOW (ref 13.0–17.0)
MCH: 29.6 pg (ref 26.0–34.0)
MCHC: 31.2 g/dL (ref 30.0–36.0)
MCV: 95 fL (ref 80.0–100.0)
Platelets: 314 10*3/uL (ref 150–400)
RBC: 4.36 MIL/uL (ref 4.22–5.81)
RDW: 15.8 % — ABNORMAL HIGH (ref 11.5–15.5)
WBC: 9.5 10*3/uL (ref 4.0–10.5)
nRBC: 0 % (ref 0.0–0.2)

## 2019-11-13 LAB — GLUCOSE, CAPILLARY: Glucose-Capillary: 144 mg/dL — ABNORMAL HIGH (ref 70–99)

## 2019-11-13 NOTE — ED Provider Notes (Signed)
[facility] Emergency Department Provider Note  Time seen: 8:33 AM  I have reviewed the triage vital signs and the nursing notes.   HISTORY  Chief Complaint Dizziness   HPI Roger [name] is a 73 y.o. male with a past medical history of anemia, arthritis, gastric reflux, hypertension presents to the emergency department for intermittent dizziness.  According to the patient he used an roach killer on Friday, states it smelled very strongly in his home so he left the house for approximately 30 minutes and left the windows cracked and fans on.  He says the smell went away pretty quickly however he continued to feel intermittently dizzy over the weekend so he decided he better come in to get checked out today.  Denies any dizziness currently.  Denies any shortness of breath or fever at any point.  No chest pain or abdominal pain.  No vomiting.   Past Medical History:  Diagnosis Date  . Anemia   . Arthritis   . Atrial fibrillation ([place])   . Bladder stone   . Dysrhythmia   . GERD (gastroesophageal reflux disease)   . Hypertension     Patient Active Problem List   Diagnosis Date Noted  . Hypotension 09/01/2019  . Ventricular fibrillation ([place]) 04/19/2019    Past Surgical History:  Procedure Laterality Date  . CATARACT EXTRACTION    . CORONARY STENT INTERVENTION N/A 04/20/2019   Procedure: CORONARY STENT INTERVENTION;  Surgeon: Yolonda Kida, MD;  Location: [place] [facility];  Service: Cardiovascular;  Laterality: N/A;  . CYSTOSCOPY WITH LITHOLAPAXY N/A 10/06/2019   Procedure: CYSTOSCOPY WITH LITHOLAPAXY;  Surgeon: Billey Co, MD;  Location: [facility];  Service: Urology;  Laterality: N/A;  . HIP SURGERY    . HOLEP-LASER ENUCLEATION OF THE PROSTATE WITH MORCELLATION N/A 10/06/2019   Procedure: HOLEP-LASER ENUCLEATION OF THE PROSTATE WITH MORCELLATION;  Surgeon: Billey Co, MD;  Location: [facility];  Service: Urology;  Laterality: N/A;  . JOINT  REPLACEMENT Right 2012   hip right  . LEFT HEART CATH AND CORONARY ANGIOGRAPHY Left 04/20/2019   Procedure: LEFT HEART CATH AND CORONARY ANGIOGRAPHY;  Surgeon: Dionisio David, MD;  Location: [place] [facility];  Service: Cardiovascular;  Laterality: Left;    Prior to Admission medications   Medication Sig Start Date End Date Taking? Authorizing Provider  apixaban (ELIQUIS) 2.5 MG TABS tablet Take 2.5 mg by mouth 2 (two) times daily.    [provider]  atorvastatin (LIPITOR) 10 MG tablet Take 10 mg by mouth daily. 05/22/19   [provider]  Cholecalciferol (VITAMIN D3) 1.25 MG (50000 UT) CAPS Take 50,000 Units by mouth every Thursday.  05/02/19   [provider]  ezetimibe (ZETIA) 10 MG tablet Take 10 mg by mouth daily. 08/13/19   [provider]  fluticasone (FLONASE) 50 MCG/ACT nasal spray Place 2 sprays into the nose daily as needed for allergies.  04/08/19 04/07/20  [provider]  furosemide (LASIX) 20 MG tablet Take 20 mg by mouth daily. 09/12/19   [provider]  ketotifen (ZADITOR) 0.025 % ophthalmic solution Place 1 drop into both eyes daily as needed (allergies).    [provider]  metoprolol tartrate (LOPRESSOR) 25 MG tablet Take 25 mg by mouth 2 (two) times daily.    [provider]  pantoprazole (PROTONIX) 40 MG tablet Take 1 tablet (40 mg total) by mouth daily. Patient taking differently: Take 40 mg by mouth daily as needed (  acid reflux).  04/21/19   Adrian Saran, MD  spironolactone (ALDACTONE) 25 MG tablet Take 25 mg by mouth daily. 08/13/19   [provider]  lisinopril (PRINIVIL,ZESTRIL) 10 MG tablet Take 5 mg by mouth daily.   06/10/19  [provider]    Allergies  Allergen Reactions  . Amoxicillin Anaphylaxis    Did it involve swelling of the face/tongue/throat, SOB, or low BP? Yes Did it involve sudden or severe rash/hives, skin peeling, or any reaction on the inside of your mouth or nose?  No Did you need to seek medical attention at a hospital or doctor's office? No When did it last happen?10+ years If all above answers are "NO", may proceed with cephalosporin use.   . Sulfa Antibiotics Anaphylaxis  . Ibuprofen     Stomachache  . Pravastatin Other (See Comments)    Irritable bowel  . Rosuvastatin Other (See Comments)    Irritable bowel    No family history on file.  Social History Social History   Tobacco Use  . Smoking status: Current Every Day Smoker    Packs/day: 0.50    Types: Cigarettes  . Smokeless tobacco: Never Used  Substance Use Topics  . Alcohol use: Yes    Comment: rare  . Drug use: No    Review of Systems Constitutional: Negative for fever.  Intermittent dizziness, none currently. Cardiovascular: Negative for chest pain. Respiratory: Negative for shortness of breath. Gastrointestinal: Negative for abdominal pain, vomiting  Musculoskeletal: Negative for musculoskeletal complaints Neurological: Negative for headache All other ROS negative  ____________________________________________   PHYSICAL EXAM:  VITAL SIGNS: ED Triage Vitals  Enc Vitals Group     BP 11/13/19 0736 (!) 132/59     Pulse Rate 11/13/19 0736 77     Resp 11/13/19 0736 20     Temp 11/13/19 0736 97.8 F (36.6 C)     Temp Source 11/13/19 0736 Oral     SpO2 11/13/19 0736 100 %     Weight 11/13/19 0731 205 lb (93 kg)     Height 11/13/19 0731 5\' 8"  (1.727 m)     Head Circumference --      Peak Flow --      Pain Score 11/13/19 0731 0     Pain Loc --      Pain Edu? --      Excl. in GC? --    Constitutional: Alert and oriented. Well appearing and in no distress. Eyes: Normal exam ENT      Head: Normocephalic and atraumatic.      Mouth/Throat: Mucous membranes are moist. Cardiovascular: Normal rate, regular rhythm. Respiratory: Normal respiratory effort without tachypnea nor retractions. Breath sounds are clear  Gastrointestinal: Soft and nontender. No  distention. Musculoskeletal: Nontender with normal range of motion in all extremities.  Neurologic:  Normal speech and language. No gross focal neurologic deficits Skin:  Skin is warm, dry and intact.  Psychiatric: Mood and affect are normal.   ____________________________________________    EKG  EKG viewed and interpreted by myself shows a appears to be a sinus versus junctional rhythm at 73 bpm with a widened QRS, normal axis, largely normal intervals nonspecific ST changes.  Largely unchanged from prior EKG 11/10/2019.  ____________________________________________   INITIAL IMPRESSION / ASSESSMENT AND PLAN / ED COURSE  Pertinent labs & imaging results that were available during my care of the patient were reviewed by me and considered in my medical decision making (see chart for details).   Patient presents  to the emergency department for intermittent dizziness over the weekend after using a bug spray.  Patient brought the bottle with him it was an ant and roach killer, not a fogger.  Patient's medical work-up including labs and EKG look fantastic.  Patient's vitals are normal and reassuring physical exam.  We will discharge patient home with PCP follow-up.   Patricia PesaRobert E Haskin was evaluated in Emergency Department on 11/13/2019 for the symptoms described in the history of present illness. He was evaluated in the context of the global COVID-19 pandemic, which necessitated consideration that the patient might be at risk for infection with the SARS-CoV-2 virus that causes COVID-19. Institutional protocols and algorithms that pertain to the evaluation of patients at risk for COVID-19 are in a state of rapid change based on information released by regulatory bodies including the CDC and federal and state organizations. These policies and algorithms were followed during the patient's care in the ED.  ____________________________________________   FINAL CLINICAL IMPRESSION(S) / ED  DIAGNOSES  Dizziness   Minna AntisPaduchowski, Camille Thau, MD 11/13/19 [id number]

## 2019-11-13 NOTE — ED Triage Notes (Signed)
Pt reports prayed some bug spray in his house this weekend and yesterday he woke up dizzy.

## 2019-11-13 NOTE — ED Notes (Signed)
Sent rainbow to lab. 

## 2019-11-16 ENCOUNTER — Encounter: Payer: Self-pay | Admitting: Urology

## 2019-11-16 ENCOUNTER — Ambulatory Visit: Payer: Medicare Other | Admitting: Urology

## 2019-11-16 ENCOUNTER — Ambulatory Visit (INDEPENDENT_AMBULATORY_CARE_PROVIDER_SITE_OTHER): Payer: Medicare Other | Admitting: Urology

## 2019-11-16 ENCOUNTER — Other Ambulatory Visit: Payer: Self-pay

## 2019-11-16 VITALS — BP 124/74 | HR 86 | Ht 68.0 in | Wt 205.0 lb

## 2019-11-16 DIAGNOSIS — N138 Other obstructive and reflux uropathy: Secondary | ICD-10-CM

## 2019-11-16 DIAGNOSIS — N401 Enlarged prostate with lower urinary tract symptoms: Secondary | ICD-10-CM

## 2019-11-16 LAB — BLADDER SCAN AMB NON-IMAGING: Scan Result: 10

## 2019-11-16 NOTE — Progress Notes (Signed)
   11/16/2019 7:48 PM   Roger [name] Jan 12, 1946 [id number]  Reason for visit: Follow up HOLEP for urinary retention  HPI: I saw Roger [name] in urology clinic today in follow-up after HOLEP.  He is a comorbid and frail 73 year old male on Plavix for CAD who underwent an uncomplicated HOLEP on 85/63/1497 for BPH with Foley dependent urinary retention and bladder stone.  He has been able to void after surgery with low PVRs.   PVR today is 10 mL.  He continues to have some intermittent mild to moderate hematuria on his Plavix, which is not unexpected.  In the first few weeks after surgery he had some urgency and some urge incontinence which has since resolved.  He is not needing to wear a pad anymore.  He feels he is urinating with a good stream and his dysuria has almost completely resolved. Overall, he is doing well.  RTC 6 months for PVR  A total of 15 minutes were spent face-to-face with the patient, greater than 50% was spent in patient education, counseling, and coordination of care regarding HOLEP and postop expectations.  Billey Co, [facility] [address] [place], [place] [address] [phone]

## 2019-11-18 ENCOUNTER — Emergency Department
Admission: EM | Admit: 2019-11-18 | Discharge: 2019-11-18 | Disposition: A | Discharge status: Home / Self Care | Payer: Medicare Other | Attending: Emergency Medicine | Admitting: Emergency Medicine

## 2019-11-18 ENCOUNTER — Other Ambulatory Visit: Payer: Self-pay

## 2019-11-18 ENCOUNTER — Encounter: Payer: Self-pay | Admitting: Emergency Medicine

## 2019-11-18 DIAGNOSIS — R040 Epistaxis: Secondary | ICD-10-CM | POA: Diagnosis present

## 2019-11-18 DIAGNOSIS — Z7901 Long term (current) use of anticoagulants: Secondary | ICD-10-CM | POA: Diagnosis not present

## 2019-11-18 DIAGNOSIS — F17210 Nicotine dependence, cigarettes, uncomplicated: Secondary | ICD-10-CM | POA: Diagnosis not present

## 2019-11-18 DIAGNOSIS — Z79899 Other long term (current) drug therapy: Secondary | ICD-10-CM | POA: Insufficient documentation

## 2019-11-18 DIAGNOSIS — Z96641 Presence of right artificial hip joint: Secondary | ICD-10-CM | POA: Diagnosis not present

## 2019-11-18 DIAGNOSIS — I10 Essential (primary) hypertension: Secondary | ICD-10-CM | POA: Insufficient documentation

## 2019-11-18 DIAGNOSIS — Z9889 Other specified postprocedural states: Secondary | ICD-10-CM | POA: Insufficient documentation

## 2019-11-18 NOTE — ED Triage Notes (Signed)
Pt arrived via POV with reports of blood nose this morning, states he isn't sure if he needs to keep putting the ointment in his nose, pt states he noticed some oozing in his nose after putting the ointment in with a q-tip.  No bleeding at this time, pt has mask applied.  Pt reports he has a spray also but isnt sure he should use it or not.

## 2019-11-18 NOTE — ED Provider Notes (Signed)
[facility] Emergency Department Provider Note  ____________________________________________  Time seen: Approximately 5:14 PM  I have reviewed the triage vital signs and the nursing notes.   HISTORY  Chief Complaint Epistaxis    HPI Roger [name] is a 73 y.o. male that presents to the emergency department for some blood spots today from his left nostril after having a cauterization with ENT on Thursday.  Patient states that he was instructed by ENT following cauterization to apply triple antibiotic ointment to his nose 2 times a day.  He thought his nose felt dry yesterday so he applied it 3 times per day.  He then got concerned that he was not sure how far up to place the Q-tip and is concerned that he was applying too much pressure.  Patient then states that he began fidgeting with his nose.  He had a couple of drops of blood "ooze" from his nose.  He noticed however there were no more blood when he did not fidget with his nose.  He brought the Kleenex with the drops of blood on it with him.  He was given a nose spray in the emergency department last Monday when he was seen here and was not sure whether he should use it or not.  No dizziness.  He has not felt any blood down the back of his throat.  No bleeding currently.  Patient denies an actual nosebleed, only spots of blood.    Past Medical History:  Diagnosis Date  . Anemia   . Arthritis   . Atrial fibrillation ([place])   . Bladder stone   . Dysrhythmia   . GERD (gastroesophageal reflux disease)   . Hypertension     Patient Active Problem List   Diagnosis Date Noted  . Hypotension 09/01/2019  . Ventricular fibrillation ([place]) 04/19/2019    Past Surgical History:  Procedure Laterality Date  . CATARACT EXTRACTION    . CORONARY STENT INTERVENTION N/A 04/20/2019   Procedure: CORONARY STENT INTERVENTION;  Surgeon: Yolonda Kida, MD;  Location: [place] [facility];  Service: Cardiovascular;   Laterality: N/A;  . CYSTOSCOPY WITH LITHOLAPAXY N/A 10/06/2019   Procedure: CYSTOSCOPY WITH LITHOLAPAXY;  Surgeon: Billey Co, MD;  Location: [facility];  Service: Urology;  Laterality: N/A;  . HIP SURGERY    . HOLEP-LASER ENUCLEATION OF THE PROSTATE WITH MORCELLATION N/A 10/06/2019   Procedure: HOLEP-LASER ENUCLEATION OF THE PROSTATE WITH MORCELLATION;  Surgeon: Billey Co, MD;  Location: [facility];  Service: Urology;  Laterality: N/A;  . JOINT REPLACEMENT Right 2012   hip right  . LEFT HEART CATH AND CORONARY ANGIOGRAPHY Left 04/20/2019   Procedure: LEFT HEART CATH AND CORONARY ANGIOGRAPHY;  Surgeon: Dionisio David, MD;  Location: [place] [facility];  Service: Cardiovascular;  Laterality: Left;    Prior to Admission medications   Medication Sig Start Date End Date Taking? Authorizing Provider  atorvastatin (LIPITOR) 10 MG tablet Take 10 mg by mouth daily. 05/22/19   [provider]  Cholecalciferol (VITAMIN D3) 1.25 MG (50000 UT) CAPS Take 50,000 Units by mouth every Thursday.  05/02/19   [provider]  clopidogrel (PLAVIX) 75 MG tablet Take 75 mg by mouth daily. 10/12/19   [provider]  ezetimibe (ZETIA) 10 MG tablet Take 10 mg by mouth daily. 08/13/19   [provider]  fluticasone (FLONASE) 50 MCG/ACT nasal spray Place 2 sprays into the nose daily as needed for allergies.  04/08/19 04/07/20  [provider]  furosemide (LASIX) 20 MG tablet Take 20 mg by mouth daily. 09/12/19   [provider]  ketotifen (ZADITOR) 0.025 % ophthalmic solution Place 1 drop into both eyes daily as needed (allergies).    [provider]  metoprolol tartrate (LOPRESSOR) 25 MG tablet Take 25 mg by mouth 2 (two) times daily.    [provider]  pantoprazole (PROTONIX) 40 MG tablet Take 1 tablet (40 mg total) by mouth daily. Patient taking differently: Take 40 mg by mouth daily as needed (acid reflux).  04/21/19   Adrian SaranMody, Sital, MD   spironolactone (ALDACTONE) 25 MG tablet Take 25 mg by mouth daily. 08/13/19   [provider]  lisinopril (PRINIVIL,ZESTRIL) 10 MG tablet Take 5 mg by mouth daily.   06/10/19  [provider]    Allergies Amoxicillin, Sulfa antibiotics, Ibuprofen, Pravastatin, and Rosuvastatin  History reviewed. No pertinent family history.  Social History Social History   Tobacco Use  . Smoking status: Current Every Day Smoker    Packs/day: 0.50    Types: Cigarettes  . Smokeless tobacco: Never Used  Substance Use Topics  . Alcohol use: Yes    Comment: rare  . Drug use: No     Review of Systems  Constitutional: No fever/chills Cardiovascular: No chest pain. Respiratory: No cough. No SOB. Skin: Negative for rash, abrasions, lacerations, ecchymosis. Neurological: Negative for headaches   ____________________________________________   PHYSICAL EXAM:  VITAL SIGNS: ED Triage Vitals  Enc Vitals Group     BP 11/18/19 1652 111/68     Pulse Rate 11/18/19 1652 66     Resp 11/18/19 1652 18     Temp 11/18/19 1652 98.6 F (37 C)     Temp Source 11/18/19 1652 Oral     SpO2 11/18/19 1652 97 %     Weight 11/18/19 1650 218 lb 3.6 oz (99 kg)     Height 11/18/19 1650 5\' 8"  (1.727 m)     Head Circumference --      Peak Flow --      Pain Score 11/18/19 1649 0     Pain Loc --      Pain Edu? --      Excl. in GC? --      Constitutional: Alert and oriented. Well appearing and in no acute distress. Eyes: Conjunctivae are normal. PERRL. EOMI. Head: Atraumatic. ENT:      Ears:      Nose: No congestion/rhinnorhea.  Minimal blood and scabbing to left nostril.  No active bleeding.  Tissue with several small spots of blood.      Mouth/Throat: Mucous membranes are moist.  No blood in oropharynx. Neck: No stridor. Cardiovascular: Normal rate, regular rhythm.  Good peripheral circulation. Respiratory: Normal respiratory effort without tachypnea or retractions. Lungs CTAB. Good air  entry to the bases with no decreased or absent breath sounds. Musculoskeletal: Full range of motion to all extremities. No gross deformities appreciated. Neurologic:  Normal speech and language. No gross focal neurologic deficits are appreciated.  Skin:  Skin is warm, dry and intact. No rash noted. Psychiatric: Mood and affect are normal. Speech and behavior are normal. Patient exhibits appropriate insight and judgement.   ____________________________________________   LABS (all labs ordered are listed, but only abnormal results are displayed)  Labs Reviewed - No data to display ____________________________________________  EKG   ____________________________________________  RADIOLOGY  No results found.  ____________________________________________    PROCEDURES  Procedure(s) performed:    Procedures  Medications - No data to display   ____________________________________________   INITIAL IMPRESSION / ASSESSMENT AND PLAN / ED COURSE  Pertinent labs & imaging results that were available during my care of the patient were reviewed by me and considered in my medical decision making (see chart for details).  Review of the  [place]RS was performed in accordance of the [facility] prior to dispensing any controlled drugs.   Patient presents to the emergency department for evaluation of epistaxis today.  Vital signs and exam are reassuring.  Patient has brought the tissue with only a couple of specks of blood on it.  Patient denies going through any additional tissues.  Patient has not had any bleeding in the emergency department.  Patient's primary concern was application of his topical antibiotic and whether he should be using his nasal spray.  Patient will use his topical antibiotic again in the morning.  He will use the nasal spray tomorrow as necessary.  Patient denies any additional symptoms.  Patient is to follow up with ENT as directed.  Patient is agreeable to call ENT on  Monday for a follow-up appointment early next week.  Patient is given ED precautions to return to the ED for any worsening or new symptoms.   VERE DIANTONIO was evaluated in Emergency Department on 11/18/2019 for the symptoms described in the history of present illness. He was evaluated in the context of the global COVID-19 pandemic, which necessitated consideration that the patient might be at risk for infection with the SARS-CoV-2 virus that causes COVID-19. Institutional protocols and algorithms that pertain to the evaluation of patients at risk for COVID-19 are in a state of rapid change based on information released by regulatory bodies including the CDC and federal and state organizations. These policies and algorithms were followed during the patient's care in the ED.  ____________________________________________  FINAL CLINICAL IMPRESSION(S) / ED DIAGNOSES  Final diagnoses:  Bleeding nose      NEW MEDICATIONS STARTED DURING THIS VISIT:  ED Discharge Orders    None          This chart was dictated using voice recognition software/Dragon. Despite best efforts to proofread, errors can occur which can change the meaning. Any change was purely unintentional.    Enid Derry, PA-C 11/18/19 1916    Phineas Semen, MD 11/18/19 2031

## 2019-11-18 NOTE — Discharge Instructions (Signed)
Wait to apply your antibiotic ointment until tomorrow morning.  You can use your nose spray tomorrow if you notice any more spots of blood.  Please return to the emergency department if you do have a nosebleed.  Please call Dr. Pryor Ochoa on Monday for a follow-up appointment.

## 2019-11-18 NOTE — ED Notes (Signed)
Pt has ointment he puts in his left nostril for s/p cautery for epistaxis. Pt said it oozed but then quit so he was worried he opened up the cautery. Pt is still not bleeding at this time.

## 2020-01-17 ENCOUNTER — Emergency Department
Admission: EM | Admit: 2020-01-17 | Discharge: 2020-01-17 | Disposition: A | Discharge status: Home / Self Care | Payer: Medicare Other | Attending: Emergency Medicine | Admitting: Emergency Medicine

## 2020-01-17 ENCOUNTER — Other Ambulatory Visit: Payer: Self-pay

## 2020-01-17 ENCOUNTER — Emergency Department: Payer: Medicare Other

## 2020-01-17 ENCOUNTER — Encounter: Payer: Self-pay | Admitting: Emergency Medicine

## 2020-01-17 ENCOUNTER — Inpatient Hospital Stay: Admit: 2020-01-17 | Payer: Medicare Other

## 2020-01-17 DIAGNOSIS — Z79899 Other long term (current) drug therapy: Secondary | ICD-10-CM | POA: Diagnosis not present

## 2020-01-17 DIAGNOSIS — M7989 Other specified soft tissue disorders: Secondary | ICD-10-CM

## 2020-01-17 DIAGNOSIS — Z9889 Other specified postprocedural states: Secondary | ICD-10-CM | POA: Diagnosis not present

## 2020-01-17 DIAGNOSIS — I10 Essential (primary) hypertension: Secondary | ICD-10-CM | POA: Insufficient documentation

## 2020-01-17 DIAGNOSIS — R2241 Localized swelling, mass and lump, right lower limb: Secondary | ICD-10-CM | POA: Diagnosis present

## 2020-01-17 DIAGNOSIS — F17210 Nicotine dependence, cigarettes, uncomplicated: Secondary | ICD-10-CM | POA: Insufficient documentation

## 2020-01-17 DIAGNOSIS — T8130XA Disruption of wound, unspecified, initial encounter: Secondary | ICD-10-CM | POA: Insufficient documentation

## 2020-01-17 DIAGNOSIS — Z5189 Encounter for other specified aftercare: Secondary | ICD-10-CM

## 2020-01-17 DIAGNOSIS — Z4801 Encounter for change or removal of surgical wound dressing: Secondary | ICD-10-CM | POA: Insufficient documentation

## 2020-01-17 DIAGNOSIS — T148XXA Other injury of unspecified body region, initial encounter: Secondary | ICD-10-CM

## 2020-01-17 DIAGNOSIS — L24A9 Irritant contact dermatitis due friction or contact with other specified body fluids: Secondary | ICD-10-CM

## 2020-01-17 NOTE — ED Triage Notes (Signed)
Patient presents to the ED with swelling and bleeding to his right knee after a procedure to his right knee on Monday.  Patient states he woke up this morning and his bandage appeared bloody and patient wanted to ensure everything was fine.  Patient states he has an appointment with ortho at 2pm this afternoon.

## 2020-01-17 NOTE — ED Notes (Signed)
Pt with gauze, ace wrap dressing on right knee. Dressing with serous drainage, small amount blood. Gauze wick drain existing skin. Leg above knee red, swollen.

## 2020-01-17 NOTE — ED Provider Notes (Signed)
[facility] Emergency Department Provider Note  ____________________________________________  Time seen: Approximately 8:39 AM  I have reviewed the triage vital signs and the nursing notes.   HISTORY  Chief Complaint Post-op Problem    HPI Roger [name] is a 74 y.o. 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  I reviewed electronic medical record and multiple orthopedic notes from recent visits.  Patient had aspiration of right prepatellar bursa which continue to reaccumulate.  He was discontinued from Eliquis and had it aspirated and it still reaccumulated, so 2 days ago Dr. Rudene Christians performed incision and drainage and packing with Xeroform gauze.  Patient denies any pain.  No chest pain or shortness of breath.  He does note that the right leg is swollen which he states has been like that for  at least a month, since before he discontinued his Eliquis.     Past Medical History:  Diagnosis Date  . Anemia   . Arthritis   . Atrial fibrillation ([place])   . Bladder stone   . Dysrhythmia   . GERD (gastroesophageal reflux disease)   . Hypertension      Patient Active Problem List   Diagnosis Date Noted  . Hypotension 09/01/2019  . Ventricular fibrillation ([place]) 04/19/2019     Past Surgical History:  Procedure Laterality Date  . CATARACT EXTRACTION    . CORONARY STENT INTERVENTION N/A 04/20/2019   Procedure: CORONARY STENT INTERVENTION;  Surgeon: Yolonda Kida, MD;  Location: [place] [facility];  Service: Cardiovascular;  Laterality: N/A;  . CYSTOSCOPY WITH LITHOLAPAXY N/A 10/06/2019   Procedure: CYSTOSCOPY WITH LITHOLAPAXY;  Surgeon: Billey Co, MD;  Location: [facility];  Service: Urology;  Laterality: N/A;  . HIP SURGERY    . HOLEP-LASER ENUCLEATION OF THE PROSTATE WITH  MORCELLATION N/A 10/06/2019   Procedure: HOLEP-LASER ENUCLEATION OF THE PROSTATE WITH MORCELLATION;  Surgeon: Billey Co, MD;  Location: [facility];  Service: Urology;  Laterality: N/A;  . JOINT REPLACEMENT Right 2012   hip right  . LEFT HEART CATH AND CORONARY ANGIOGRAPHY Left 04/20/2019   Procedure: LEFT HEART CATH AND CORONARY ANGIOGRAPHY;  Surgeon: Dionisio David, MD;  Location: [place] [facility];  Service: Cardiovascular;  Laterality: Left;     Prior to Admission medications   Medication Sig Start Date End Date Taking? Authorizing Provider  atorvastatin (LIPITOR) 10 MG tablet Take 10 mg by mouth daily. 05/22/19   [provider]  Cholecalciferol (VITAMIN D3) 1.25 MG (50000 UT) CAPS Take 50,000 Units by mouth every Thursday.  05/02/19   [provider]  clopidogrel (PLAVIX) 75 MG tablet Take 75 mg by mouth daily. 10/12/19   [provider]  ezetimibe (ZETIA) 10 MG tablet Take 10 mg by mouth daily. 08/13/19   [provider]  fluticasone (FLONASE) 50 MCG/ACT nasal spray Place 2 sprays into the nose daily as needed for allergies.  04/08/19 04/07/20  [provider]  furosemide (LASIX) 20 MG tablet Take 20 mg by mouth daily. 09/12/19   [provider]  ketotifen (ZADITOR) 0.025 % ophthalmic solution Place 1 drop into both eyes daily as needed (allergies).    [provider]  metoprolol tartrate (LOPRESSOR) 25 MG tablet Take 25 mg by mouth 2 (two) times daily.    [provider]  pantoprazole ([place])  40 MG tablet Take 1 tablet (40 mg total) by mouth daily. Patient taking differently: Take 40 mg by mouth daily as needed (acid reflux).  04/21/19   Adrian Saran, MD  spironolactone (ALDACTONE) 25 MG tablet Take 25 mg by mouth daily. 08/13/19   [provider]  lisinopril (PRINIVIL,ZESTRIL) 10 MG tablet Take 5 mg by mouth daily.   06/10/19  [provider]     Allergies Amoxicillin, Sulfa antibiotics, Ibuprofen,  Pravastatin, and Rosuvastatin   No family history on file.  Social History Social History   Tobacco Use  . Smoking status: Current Every Day Smoker    Packs/day: 0.50    Types: Cigarettes  . Smokeless tobacco: Never Used  Substance Use Topics  . Alcohol use: Yes    Comment: rare  . Drug use: No    Review of Systems  Constitutional:   No fever or chills.  ENT:   No sore throat. No rhinorrhea. Cardiovascular:   No chest pain or syncope. Respiratory:   No dyspnea or cough. Gastrointestinal:   Negative for abdominal pain, vomiting and diarrhea.  Musculoskeletal: Right leg swelling, right knee drainage All other systems reviewed and are negative except as documented above in ROS and HPI.  ____________________________________________   PHYSICAL EXAM:  VITAL SIGNS: ED Triage Vitals  Enc Vitals Group     BP 01/17/20 0720 130/70     Pulse Rate 01/17/20 0720 85     Resp 01/17/20 0720 16     Temp 01/17/20 0720 98.3 F (36.8 C)     Temp Source 01/17/20 0720 Oral     SpO2 01/17/20 0720 100 %     Weight 01/17/20 0721 215 lb (97.5 kg)     Height 01/17/20 0721 5\' 8"  (1.727 m)     Head Circumference --      Peak Flow --      Pain Score 01/17/20 0720 0     Pain Loc --      Pain Edu? --      Excl. in GC? --     Vital signs reviewed, nursing assessments reviewed.   Constitutional:   Alert and oriented. Non-toxic appearance. Eyes:   Conjunctivae are normal. EOMI. PERRL. ENT      Head:   Normocephalic and atraumatic.      Nose:   Wearing a mask.      Mouth/Throat:   Wearing a mask.      Neck:   No meningismus. Full ROM. Hematological/Lymphatic/Immunilogical:   No cervical lymphadenopathy. Cardiovascular:   RRR. Symmetric bilateral radial and DP pulses.  No murmurs. Cap refill less than 2 seconds. Respiratory:   Normal respiratory effort without tachypnea/retractions. Breath sounds are clear and equal bilaterally. No wheezes/rales/rhonchi. Gastrointestinal:   Soft and  nontender. Non distended. There is no CVA tenderness.  No rebound, rigidity, or guarding.  Musculoskeletal:   Normal range of motion in all extremities. No joint effusions.  No lower extremity tenderness.  1+ pitting edema right lower extremity.  No pain with range of motion of the knee.  Packing in place of right prepatellar space.  Serous sanguinous drainage on the dressing.  No purulent drainage, no inflammatory changes. Neurologic:   Normal speech and language.  Motor grossly intact. No acute focal neurologic deficits are appreciated.  Skin:    Skin is warm, dry and intact. No rash noted.  No petechiae, purpura, or bullae.  ____________________________________________    LABS (pertinent positives/negatives) (all labs ordered are listed, but only  abnormal results are displayed) Labs Reviewed - No data to display ____________________________________________   EKG    ____________________________________________    RADIOLOGY  US Venous Img Lower Unilateral Right  Result Date: 01/17/2020 CLINICAL DATA:  74 year old male with right lower extremity edema since November of 2020. EXAM: RIGHT LOWER EXTREMITY VENOUS DOPPLER ULTRASOUND TECHNIQUE: Gray-scale sonography with graded compression, as well as color Doppler and duplex ultrasound were performed to evaluate the lower extremity deep venous systems from the level of the common femoral vein and including the common femoral, femoral, profunda femoral, popliteal and calf veins including the posterior tibial, peroneal and gastrocnemius veins when visible. The superficial great saphenous vein was also interrogated. Spectral Doppler was utilized to evaluate flow at rest and with distal augmentation maneuvers in the common femoral, femoral and popliteal veins. COMPARISON:  Prior duplex venous ultrasound 10/27/2019 FINDINGS: Contralateral Common Femoral Vein: Respiratory phasicity is normal and symmetric with the symptomatic side. No evidence of  thrombus. Normal compressibility. Common Femoral Vein: No evidence of thrombus. Normal compressibility, respiratory phasicity and response to augmentation. Saphenofemoral Junction: No evidence of thrombus. Normal compressibility and flow on color Doppler imaging. Profunda Femoral Vein: No evidence of thrombus. Normal compressibility and flow on color Doppler imaging. Femoral Vein: No evidence of thrombus. Normal compressibility, respiratory phasicity and response to augmentation. Popliteal Vein: No evidence of thrombus. Normal compressibility, respiratory phasicity and response to augmentation. Calf Veins: No evidence of thrombus. Normal compressibility and flow on color Doppler imaging. Superficial Great Saphenous Vein: No evidence of thrombus. Normal compressibility. Venous Reflux:  None. Other Findings: Elongated complex hypoechoic collection present within the deep subcutaneous fat the medial distal thigh extending past the knee into the calf. This collection measures approximately 12.6 cm in length, 8.1 cm in width and approximately 1.7 cm in width. IMPRESSION: 1. No evidence of deep venous thrombosis in the right lower extremity. 2. Elongated complex fluid collection in the deep subcutaneous fat along the medial aspect of the right lower thigh and extending to just below the knee. The sonographic appearance is most suggestive of a hematoma, particularly given the prior history of fall. In the appropriate clinical setting, abscess could appear similar. Electronically Signed   By: Malachy Moan M.D.   On: 01/17/2020 10:17    ____________________________________________   PROCEDURES Procedures  ____________________________________________    CLINICAL IMPRESSION / ASSESSMENT AND PLAN / ED COURSE  Medications ordered in the ED: Medications - No data to display  Pertinent labs & imaging results that were available during my care of the patient were reviewed by me and considered in my medical  decision making (see chart for details).  Roger [name] was evaluated in Emergency Department on 01/17/2020 for the symptoms described in the history of present illness. He was evaluated in the context of the global COVID-19 pandemic, which necessitated consideration that the patient might be at risk for infection with the SARS-CoV-2 virus that causes COVID-19. Institutional protocols and algorithms that pertain to the evaluation of patients at risk for COVID-19 are in a state of rapid change based on information released by regulatory bodies including the CDC and federal and state organizations. These policies and algorithms were followed during the patient's care in the ED.   Patient presents with serosanguineous drainage from the right prepatellar space, expected given his recent bursitis and packing with wick placement.  I will obtain an ultrasound of the right lower extremity to evaluate for DVT given the discontinuation of Eliquis and diffuse edema of the  leg which is asymmetric although he notes that this has been ongoing for several months and has no symptoms of VTE.  Vital signs are normal, he is nontoxic, he will be stable for outpatient follow-up with orthopedics after ultrasound.   ----------------------------------------- 11:18 AM on 01/17/2020 -----------------------------------------  Ultrasound unremarkable, stable for discharged orthopedic follow-up this afternoon     ____________________________________________   FINAL CLINICAL IMPRESSION(S) / ED DIAGNOSES    Final diagnoses:  Right leg swelling  Encounter for wound re-check  Drainage from wound     ED Discharge Orders    None      Portions of this note were generated with dragon dictation software. Dictation errors may occur despite best attempts at proofreading.   Sharman Cheek, MD 01/17/20 1118

## 2020-01-17 NOTE — Discharge Instructions (Addendum)
Your ultrasound today does not find any blood clots in the leg, but does show some evidence of a hematoma in the right lower thigh from your previous injury. Continue to monitor the area for signs of infection such as redness, warmth, worsening pain or worsening swelling. Please follow-up with orthopedics as planned today.

## 2020-04-06 ENCOUNTER — Other Ambulatory Visit: Payer: Self-pay

## 2020-04-06 ENCOUNTER — Emergency Department
Admission: EM | Admit: 2020-04-06 | Discharge: 2020-04-06 | Disposition: A | Discharge status: Home / Self Care | Payer: Medicare Other | Attending: Emergency Medicine | Admitting: Emergency Medicine

## 2020-04-06 DIAGNOSIS — Z955 Presence of coronary angioplasty implant and graft: Secondary | ICD-10-CM | POA: Insufficient documentation

## 2020-04-06 DIAGNOSIS — F17210 Nicotine dependence, cigarettes, uncomplicated: Secondary | ICD-10-CM | POA: Diagnosis not present

## 2020-04-06 DIAGNOSIS — Z79899 Other long term (current) drug therapy: Secondary | ICD-10-CM | POA: Diagnosis not present

## 2020-04-06 DIAGNOSIS — Y9389 Activity, other specified: Secondary | ICD-10-CM | POA: Diagnosis not present

## 2020-04-06 DIAGNOSIS — Z96641 Presence of right artificial hip joint: Secondary | ICD-10-CM | POA: Insufficient documentation

## 2020-04-06 DIAGNOSIS — I10 Essential (primary) hypertension: Secondary | ICD-10-CM | POA: Diagnosis not present

## 2020-04-06 DIAGNOSIS — X58XXXA Exposure to other specified factors, initial encounter: Secondary | ICD-10-CM | POA: Diagnosis not present

## 2020-04-06 DIAGNOSIS — S81811A Laceration without foreign body, right lower leg, initial encounter: Secondary | ICD-10-CM | POA: Diagnosis not present

## 2020-04-06 DIAGNOSIS — Y998 Other external cause status: Secondary | ICD-10-CM | POA: Diagnosis not present

## 2020-04-06 DIAGNOSIS — Y929 Unspecified place or not applicable: Secondary | ICD-10-CM | POA: Insufficient documentation

## 2020-04-06 DIAGNOSIS — S8991XA Unspecified injury of right lower leg, initial encounter: Secondary | ICD-10-CM | POA: Diagnosis present

## 2020-04-06 MED ORDER — BACITRACIN-NEOMYCIN-POLYMYXIN 400-5-5000 EX OINT
TOPICAL_OINTMENT | Freq: Once | CUTANEOUS | Status: AC
  Filled 2020-04-06: qty 1

## 2020-04-06 NOTE — ED Provider Notes (Signed)
[facility] Emergency Department Provider Note ____________________________________________  Time seen: 1700  I have reviewed the triage vital signs and the nursing notes.  HISTORY  Chief Complaint  Skin Ulcer   HPI Roger [name] is a 74 y.o. male presents to the ER today with complaint of skin tear to his right lower extremity.  He reports he was scratching his right lower leg on Monday and tore the skin.  He cleansed the area and has been putting Band-Aids over it.  He has noticed some clear drainage.  He denies any redness or swelling.  He reports the area is tender to touch.  He denies fever, chills or nausea.  He has bacitracin at home but has not used this.  He has no history of diabetes.  He does have a history of psoriasis to the right lower extremity.  Past Medical History:  Diagnosis Date  . Anemia   . Arthritis   . Atrial fibrillation ([place])   . Bladder stone   . Dysrhythmia   . GERD (gastroesophageal reflux disease)   . Hypertension     Patient Active Problem List   Diagnosis Date Noted  . Hypotension 09/01/2019  . Ventricular fibrillation ([place]) 04/19/2019    Past Surgical History:  Procedure Laterality Date  . CATARACT EXTRACTION    . CORONARY STENT INTERVENTION N/A 04/20/2019   Procedure: CORONARY STENT INTERVENTION;  Surgeon: Yolonda Kida, MD;  Location: [place] [facility];  Service: Cardiovascular;  Laterality: N/A;  . CYSTOSCOPY WITH LITHOLAPAXY N/A 10/06/2019   Procedure: CYSTOSCOPY WITH LITHOLAPAXY;  Surgeon: Billey Co, MD;  Location: [facility];  Service: Urology;  Laterality: N/A;  . HIP SURGERY    . HOLEP-LASER ENUCLEATION OF THE PROSTATE WITH MORCELLATION N/A 10/06/2019   Procedure: HOLEP-LASER ENUCLEATION OF THE PROSTATE WITH MORCELLATION;  Surgeon: Billey Co, MD;  Location: [facility];  Service: Urology;  Laterality: N/A;  . JOINT REPLACEMENT Right 2012   hip right  . LEFT HEART CATH AND CORONARY ANGIOGRAPHY  Left 04/20/2019   Procedure: LEFT HEART CATH AND CORONARY ANGIOGRAPHY;  Surgeon: Dionisio David, MD;  Location: [place] [facility];  Service: Cardiovascular;  Laterality: Left;    Prior to Admission medications   Medication Sig Start Date End Date Taking? Authorizing Provider  atorvastatin (LIPITOR) 10 MG tablet Take 10 mg by mouth daily. 05/22/19   [provider]  Cholecalciferol (VITAMIN D3) 1.25 MG (50000 UT) CAPS Take 50,000 Units by mouth every Thursday.  05/02/19   [provider]  clopidogrel (PLAVIX) 75 MG tablet Take 75 mg by mouth daily. 10/12/19   [provider]  ezetimibe (ZETIA) 10 MG tablet Take 10 mg by mouth daily. 08/13/19   [provider]  fluticasone (FLONASE) 50 MCG/ACT nasal spray Place 2 sprays into the nose daily as needed for allergies.  04/08/19 04/07/20  [provider]  furosemide (LASIX) 20 MG tablet Take 20 mg by mouth daily. 09/12/19   [provider]  ketotifen (ZADITOR) 0.025 % ophthalmic solution Place 1 drop into both eyes daily as needed (allergies).    [provider]  metoprolol tartrate (LOPRESSOR) 25 MG tablet Take 25 mg by mouth 2 (two) times daily.    [provider]  pantoprazole (PROTONIX) 40 MG tablet Take 1 tablet (40 mg total) by mouth daily. Patient taking differently: Take 40 mg by mouth daily as needed (acid reflux).  04/21/19   Bettey Costa, MD  spironolactone (ALDACTONE) 25  MG tablet Take 25 mg by mouth daily. 08/13/19   [provider]  lisinopril (PRINIVIL,ZESTRIL) 10 MG tablet Take 5 mg by mouth daily.   06/10/19  [provider]    Allergies Amoxicillin, Sulfa antibiotics, Ibuprofen, Pravastatin, and Rosuvastatin  History reviewed. No pertinent family history.  Social History Social History   Tobacco Use  . Smoking status: Current Every Day Smoker    Packs/day: 0.50    Types: Cigarettes  . Smokeless tobacco: Never Used  Substance Use Topics  . Alcohol  use: Yes    Comment: rare  . Drug use: No    Review of Systems  Constitutional: Negative for fever chills or body aches. Cardiovascular: Negative for chest pain or chest tightness. Respiratory: Negative for cough or shortness of breath. Skin: Positive for skin tear to right lower extremity. Neurological: Negative for headaches, focal weakness, tingling or numbness. ____________________________________________  PHYSICAL EXAM:  VITAL SIGNS: ED Triage Vitals  Enc Vitals Group     BP 04/06/20 1554 (!) 161/79     Pulse Rate 04/06/20 1554 80     Resp 04/06/20 1554 18     Temp 04/06/20 1554 97.6 F (36.4 C)     Temp Source 04/06/20 1554 Oral     SpO2 04/06/20 1554 95 %     Weight 04/06/20 1554 210 lb (95.3 kg)     Height 04/06/20 1554 5\' 8"  (1.727 m)     Head Circumference --      Peak Flow --      Pain Score 04/06/20 1553 0     Pain Loc --      Pain Edu? --      Excl. in GC? --     Constitutional: Alert and oriented.  Obese, in no distress. Cardiovascular: Normal rate, regular rhythm.  Respiratory: Normal respiratory effort. No wheezes/rales/rhonchi. Neurologic:  Normal gait without ataxia. Normal speech and language. No gross focal neurologic deficits are appreciated. Skin: 1.5 cm triangular skin tear noted to right anterior shin.  No surrounding redness or swelling.  No purulent drainage noted. ______________________________________  INITIAL IMPRESSION / ASSESSMENT AND PLAN / ED COURSE  Noninfected Skin Tear to RLE:  Skin tear cleansed with NS Applied TAB and Band Aid No evidence of infection at this time Aftercare instructions provided  ___________________  FINAL CLINICAL IMPRESSION(S) / ED DIAGNOSES  Final diagnoses:  Noninfected skin tear of right lower extremity, initial encounter      06/06/20, NP 04/06/20 1714    06/06/20, MD 04/06/20 1907

## 2020-04-06 NOTE — Discharge Instructions (Addendum)
You were seen today for a skin tear to your right lower extremity.  There is no evidence of infection.  We cleansed the wound, applied triple antibiotic ointment and a Band-Aid.  Please cleanse daily, apply thin film of bacitracin and a Band-Aid.  Change daily until healed.  Monitor for increased redness, swelling or pain.  Follow-up with PCP if symptoms persist or worsen.

## 2020-04-06 NOTE — ED Notes (Signed)
See triage note. Small red skin tear noted on right shin. Pt c/o clear drainage. No bleeding or drainage noted at this time.

## 2020-04-06 NOTE — ED Notes (Signed)
Ointment and large Band-Aid applied to skin tear.

## 2020-04-06 NOTE — ED Triage Notes (Signed)
Pt was scratching R shin and tore skin. Denies bleeding. No bleeding at present. States "it stings when I touch it." A&O, ambulatory.

## 2020-04-30 ENCOUNTER — Encounter: Payer: Self-pay | Admitting: Urology

## 2020-05-15 ENCOUNTER — Other Ambulatory Visit: Payer: Self-pay

## 2020-05-15 ENCOUNTER — Emergency Department
Admission: EM | Admit: 2020-05-15 | Discharge: 2020-05-15 | Disposition: A | Discharge status: Home / Self Care | Payer: Medicare Other | Attending: Emergency Medicine | Admitting: Emergency Medicine

## 2020-05-15 ENCOUNTER — Encounter: Payer: Self-pay | Admitting: Intensive Care

## 2020-05-15 DIAGNOSIS — F17210 Nicotine dependence, cigarettes, uncomplicated: Secondary | ICD-10-CM | POA: Diagnosis not present

## 2020-05-15 DIAGNOSIS — Z79899 Other long term (current) drug therapy: Secondary | ICD-10-CM | POA: Diagnosis not present

## 2020-05-15 DIAGNOSIS — I10 Essential (primary) hypertension: Secondary | ICD-10-CM | POA: Insufficient documentation

## 2020-05-15 DIAGNOSIS — R42 Dizziness and giddiness: Secondary | ICD-10-CM

## 2020-05-15 DIAGNOSIS — Z7901 Long term (current) use of anticoagulants: Secondary | ICD-10-CM | POA: Diagnosis not present

## 2020-05-15 LAB — BASIC METABOLIC PANEL
Anion gap: 8 (ref 5–15)
BUN: 25 mg/dL — ABNORMAL HIGH (ref 8–23)
CO2: 28 mmol/L (ref 22–32)
Calcium: 8.8 mg/dL — ABNORMAL LOW (ref 8.9–10.3)
Chloride: 105 mmol/L (ref 98–111)
Creatinine, Ser: 0.99 mg/dL (ref 0.61–1.24)
GFR calc Af Amer: >60 mL/min (ref >60)
GFR calc non Af Amer: >60 mL/min (ref >60)
Glucose, Bld: 103 mg/dL — ABNORMAL HIGH (ref 70–99)
Potassium: 4.2 mmol/L (ref 3.5–5.1)
Sodium: 141 mmol/L (ref 135–145)

## 2020-05-15 LAB — CBC
HCT: 44.4 % (ref 39.0–52.0)
Hemoglobin: 14.1 g/dL (ref 13.0–17.0)
MCH: 29.7 pg (ref 26.0–34.0)
MCHC: 31.8 g/dL (ref 30.0–36.0)
MCV: 93.7 fL (ref 80.0–100.0)
Platelets: 236 10*3/uL (ref 150–400)
RBC: 4.74 MIL/uL (ref 4.22–5.81)
RDW: 15.9 % — ABNORMAL HIGH (ref 11.5–15.5)
WBC: 9.6 10*3/uL (ref 4.0–10.5)
nRBC: 0 % (ref 0.0–0.2)

## 2020-05-15 NOTE — ED Notes (Signed)
Patient up to the desk and states he is not feeling as dizzy as he was earlier.  Patient states, his cardiologist recently changed his medications and he is wondering if that is possibly the cause of his dizziness.  Patient also states he needs to take his evening medications

## 2020-05-15 NOTE — Discharge Instructions (Addendum)
For now, we are going to STOP/HOLD your ENTRESTO, which is the new medication prescribed by your doctor. This COULD be causing your dizziness.  In the meantime, continue your other medications AS PRESCRIBED: Metoprolol 25 mg twice a day Nexlizet 180-10 mg tablet daily at 6 am Eliquis 2.5 mg twice a day Protonix 40 mg daily Spironolactone 25 mg daily  Call Dr. Francis Dowse MORNING to discuss your medication changes, and keep your follow-up appointment

## 2020-05-15 NOTE — ED Provider Notes (Signed)
[facility] Emergency Department Provider Note  ____________________________________________   First MD Initiated Contact with Patient 05/15/20 1804     (approximate)  I have reviewed the triage vital signs and the nursing notes.   HISTORY  Chief Complaint Dizziness    HPI Roger [name] is a 74 y.o. male with past medical history of hypertension, GERD, A. fib, here with lightheadedness.  The patient was recently seen by his primary doctor.  He had Entresto added and his metoprolol increased.  Since then, the patient states that he has had intermittent dizziness and lightheadedness, particularly shortly after taking his medications.  He states that he stopped taking one of his medications yesterday, which did help his symptoms, though he does not recall which one.  He is due to take his meds today.  He denies any persistent numbness, weakness, dizziness, slurred speech, or other acute complaints.  No headaches.        Past Medical History:  Diagnosis Date  . Anemia   . Arthritis   . Atrial fibrillation (HCC)   . Bladder stone   . Dysrhythmia   . GERD (gastroesophageal reflux disease)   . Hypertension     Patient Active Problem List   Diagnosis Date Noted  . Hypotension 09/01/2019  . Ventricular fibrillation (HCC) 04/19/2019    Past Surgical History:  Procedure Laterality Date  . CATARACT EXTRACTION    . CORONARY STENT INTERVENTION N/A 04/20/2019   Procedure: CORONARY STENT INTERVENTION;  Surgeon: Alwyn Pea, MD;  Location: [facility] INVASIVE CV LAB;  Service: Cardiovascular;  Laterality: N/A;  . CYSTOSCOPY WITH LITHOLAPAXY N/A 10/06/2019   Procedure: CYSTOSCOPY WITH LITHOLAPAXY;  Surgeon: Sondra Come, MD;  Location: [facility] ORS;  Service: Urology;  Laterality: N/A;  . HIP SURGERY    . HOLEP-LASER ENUCLEATION OF THE PROSTATE WITH MORCELLATION N/A 10/06/2019   Procedure: HOLEP-LASER ENUCLEATION OF THE PROSTATE WITH MORCELLATION;  Surgeon:  Sondra Come, MD;  Location: [facility] ORS;  Service: Urology;  Laterality: N/A;  . JOINT REPLACEMENT Right 2012   hip right  . LEFT HEART CATH AND CORONARY ANGIOGRAPHY Left 04/20/2019   Procedure: LEFT HEART CATH AND CORONARY ANGIOGRAPHY;  Surgeon: Laurier Nancy, MD;  Location: [facility] INVASIVE CV LAB;  Service: Cardiovascular;  Laterality: Left;    Prior to Admission medications   Medication Sig Start Date End Date Taking? Authorizing Provider  Bempedoic Acid-Ezetimibe (NEXLIZET) 180-10 MG TABS Take 1 tablet by mouth daily at 6 (six) AM.   Yes [provider]  Cholecalciferol (VITAMIN D3) 1.25 MG (50000 UT) CAPS Take 50,000 Units by mouth every Thursday.  05/02/19  Yes [provider]  ELIQUIS 2.5 MG TABS tablet Take 2.5 mg by mouth 2 (two) times daily. 04/12/20  Yes [provider]  ezetimibe (ZETIA) 10 MG tablet Take 10 mg by mouth daily. 08/13/19  Yes [provider]  metoprolol tartrate (LOPRESSOR) 25 MG tablet Take 25 mg by mouth 2 (two) times daily.   Yes [provider]  pantoprazole (PROTONIX) 40 MG tablet Take 1 tablet (40 mg total) by mouth daily. Patient taking differently: Take 40 mg by mouth daily as needed (acid reflux).  04/21/19  Yes Mody, [name], MD  sacubitril-valsartan (ENTRESTO) 24-26 MG Take 0.5 tablets by mouth 2 (two) times daily.   Yes [provider]  spironolactone (ALDACTONE) 25 MG tablet Take 25 mg by mouth daily. 08/13/19  Yes [provider]  azelastine (ASTELIN) 0.1 % nasal spray Place  1 spray into both nostrils 2 (two) times daily. 05/03/20   [provider]  ergocalciferol (VITAMIN D2) 1.25 MG (50000 UT) capsule Take 50,000 Units by mouth once a week. Thursday    [provider]  ketotifen (ZADITOR) 0.025 % ophthalmic solution Place 1 drop into both eyes daily as needed (allergies).    [provider]  lisinopril (PRINIVIL,ZESTRIL) 10 MG tablet Take 5 mg by mouth daily.   06/10/19   [provider]    Allergies Amoxicillin, Sulfa antibiotics, Ibuprofen, Pravastatin, and Rosuvastatin  History reviewed. No pertinent family history.  Social History Social History   Tobacco Use  . Smoking status: Current Every Day Smoker    Packs/day: 0.50    Types: Cigarettes  . Smokeless tobacco: Never Used  Substance Use Topics  . Alcohol use: Yes    Comment: rare  . Drug use: No    Review of Systems  Review of Systems  Constitutional: Positive for fatigue. Negative for chills and fever.  HENT: Negative for sore throat.   Respiratory: Negative for shortness of breath.   Cardiovascular: Negative for chest pain.  Gastrointestinal: Negative for abdominal pain.  Genitourinary: Negative for flank pain.  Musculoskeletal: Negative for neck pain.  Skin: Negative for rash and wound.  Allergic/Immunologic: Negative for immunocompromised state.  Neurological: Positive for dizziness and light-headedness. Negative for numbness.  Hematological: Does not bruise/bleed easily.     ____________________________________________  PHYSICAL EXAM:      VITAL SIGNS: ED Triage Vitals  Enc Vitals Group     BP 05/15/20 1336 132/64     Pulse Rate 05/15/20 1336 77     Resp 05/15/20 1336 16     Temp 05/15/20 1336 97.6 F (36.4 C)     Temp Source 05/15/20 1336 Oral     SpO2 05/15/20 1336 96 %     Weight 05/15/20 1339 220 lb (99.8 kg)     Height 05/15/20 1339 5\' 8"  (1.727 m)     Head Circumference --      Peak Flow --      Pain Score 05/15/20 1339 0     Pain Loc --      Pain Edu? --      Excl. in GC? --      Physical Exam Vitals and nursing note reviewed.  Constitutional:      General: He is not in acute distress.    Appearance: He is well-developed.  HENT:     Head: Normocephalic and atraumatic.  Eyes:     Conjunctiva/sclera: Conjunctivae normal.  Cardiovascular:     Rate and Rhythm: Normal rate and regular rhythm.     Heart sounds: Normal heart sounds. No  murmur. No friction rub.  Pulmonary:     Effort: Pulmonary effort is normal. No respiratory distress.     Breath sounds: Normal breath sounds. No wheezing or rales.  Abdominal:     General: There is no distension.     Palpations: Abdomen is soft.     Tenderness: There is no abdominal tenderness.  Musculoskeletal:     Cervical back: Neck supple.  Skin:    General: Skin is warm.     Capillary Refill: Capillary refill takes less than 2 seconds.  Neurological:     Mental Status: He is alert and oriented to person, place, and time.     Motor: No abnormal muscle tone.     Comments: Neurological Exam:  Mental Status: Alert and oriented to person, place, and  time. Attention and concentration normal. Speech clear. Recent memory is intact. Cranial Nerves: Visual fields grossly intact. EOMI and PERRLA. No nystagmus noted. Facial sensation intact at forehead, maxillary cheek, and chin/mandible bilaterally. No facial asymmetry or weakness. Hearing grossly normal. Uvula is midline, and palate elevates symmetrically. Normal SCM and trapezius strength. Tongue midline without fasciculations. Motor: Muscle strength 5/5 in proximal and distal UE and LE bilaterally. No pronator drift. Muscle tone normal. Sensation: Intact to light touch in upper and lower extremities distally bilaterally.  Gait: Normal without ataxia. Coordination: Normal FTN bilaterally.          ____________________________________________   LABS (all labs ordered are listed, but only abnormal results are displayed)  Labs Reviewed  BASIC METABOLIC PANEL - Abnormal; Notable for the following components:      Result Value   Glucose, Bld 103 (*)    BUN 25 (*)    Calcium 8.8 (*)    All other components within normal limits  CBC - Abnormal; Notable for the following components:   RDW 15.9 (*)    All other components within normal limits  URINALYSIS, COMPLETE (UACMP) WITH MICROSCOPIC     ____________________________________________  EKG: Atrial fibrillation, VR 71. QRS 132, QTc 449. Non-specific IVCB, no acute ST elevation or depressions. ________________________________________  RADIOLOGY All imaging, including plain films, CT scans, and ultrasounds, independently reviewed by me, and interpretations confirmed via formal radiology reads.  ED MD interpretation:  None  Official radiology report(s): No results found.  ____________________________________________  PROCEDURES   Procedure(s) performed (including Critical Care):  Procedures  ____________________________________________  INITIAL IMPRESSION / MDM / [place] / ED COURSE  As part of my medical decision making, I reviewed the following data within the [place] notes reviewed and incorporated, Old chart reviewed, Notes from prior ED visits, and [place] Controlled Substance Database       *Roger [name] was evaluated in Emergency Department on 05/15/2020 for the symptoms described in the history of present illness. He was evaluated in the context of the global COVID-19 pandemic, which necessitated consideration that the patient might be at risk for infection with the SARS-CoV-2 virus that causes COVID-19. Institutional protocols and algorithms that pertain to the evaluation of patients at risk for COVID-19 are in a state of rapid change based on information released by regulatory bodies including the CDC and federal and state organizations. These policies and algorithms were followed during the patient's care in the ED.  Some ED evaluations and interventions may be delayed as a result of limited staffing during the pandemic.*     Medical Decision Making:  74 yo M here with occasional lightheadedness after starting Entresto and increasing his metoprolol. Symptoms correlate directly with when he takes his new entresto and resolve over time before his next dose. He has no  neuro deficits, no associated dysarthria, dysphagia, or other sx to suggest cerebellar or central etiology. Screening labs sent in triage reviewed and are unremarkable - normal Hgb, baseline lytes. EKG is non-ischemic and at baseline. His AFib is well controlled. Discussed likelihood of his lightheadedness being caused by his meds, and pt strongly suspects this. He states he held his entresto once yesterday and sx resolved. Does not feel that CT is needed and I am in agreement given his dizziness is more so lightheadedness with no focal head trauma, no signs of cerebellar or other abnormality. Will have him hold his entresto for now and call his MD in AM. He  is aware he should not take this. Encouraged fluids/hydration at home. Return precautions given.  ____________________________________________  FINAL CLINICAL IMPRESSION(S) / ED DIAGNOSES  Final diagnoses:  None     MEDICATIONS GIVEN DURING THIS VISIT:  Medications - No data to display   ED Discharge Orders    None       Note:  This document was prepared using Dragon voice recognition software and may include unintentional dictation errors.   Shaune Pollack, MD 05/15/20 2042

## 2020-05-15 NOTE — ED Triage Notes (Signed)
Patient c/o intermittent dizziness for a few days. Patient drove himself to hospital. A&O x4 in triage

## 2020-05-27 ENCOUNTER — Ambulatory Visit: Payer: Medicare Other | Admitting: Urology

## 2020-06-05 ENCOUNTER — Encounter: Payer: Self-pay | Admitting: Urology

## 2020-06-05 ENCOUNTER — Other Ambulatory Visit: Payer: Self-pay

## 2020-06-05 ENCOUNTER — Ambulatory Visit (INDEPENDENT_AMBULATORY_CARE_PROVIDER_SITE_OTHER): Payer: Medicare Other | Admitting: Urology

## 2020-06-05 VITALS — BP 140/83 | HR 79 | Ht 68.0 in | Wt 220.0 lb

## 2020-06-05 DIAGNOSIS — N401 Enlarged prostate with lower urinary tract symptoms: Secondary | ICD-10-CM

## 2020-06-05 DIAGNOSIS — N138 Other obstructive and reflux uropathy: Secondary | ICD-10-CM | POA: Diagnosis not present

## 2020-06-05 LAB — BLADDER SCAN AMB NON-IMAGING: Scan Result: 5

## 2020-06-05 NOTE — Progress Notes (Signed)
   06/05/2020 9:50 AM   Roger [name] Sep 03, 1946 [id number]  Reason for visit: Follow up HoLEP and cystolitholopaxy  HPI: I saw [name] in urology clinic today in follow-up after HoLEP and cystolitholapaxy.  He is a comorbid and frail 74 year old male on Plavix for CAD who underwent an uncomplicated HoLEP on 10/06/2019 for BPH with Foley dependent urinary retention and bladder stone. 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 He has been able to void after surgery with low PVRs.   PVR today is 5 mL.  He reports he has been doing very well since surgery. He denies any gross hematuria over the last 6 months. His IPSS score today is 3, with quality of life delighted. He will occasionally have some urgency and rarely incontinence, but this only happens maybe once per month.  RTC 1 year with PVR  Sondra Come, MD  [facility] [address], [address] [place], [place] [address] [phone]

## 2020-08-05 ENCOUNTER — Encounter: Payer: Self-pay | Admitting: Emergency Medicine

## 2020-08-05 ENCOUNTER — Emergency Department
Admission: EM | Admit: 2020-08-05 | Discharge: 2020-08-05 | Disposition: A | Discharge status: Home / Self Care | Payer: Medicare Other | Attending: Emergency Medicine | Admitting: Emergency Medicine

## 2020-08-05 ENCOUNTER — Other Ambulatory Visit: Payer: Self-pay

## 2020-08-05 DIAGNOSIS — Z7901 Long term (current) use of anticoagulants: Secondary | ICD-10-CM | POA: Insufficient documentation

## 2020-08-05 DIAGNOSIS — Z96641 Presence of right artificial hip joint: Secondary | ICD-10-CM | POA: Diagnosis not present

## 2020-08-05 DIAGNOSIS — I10 Essential (primary) hypertension: Secondary | ICD-10-CM | POA: Insufficient documentation

## 2020-08-05 DIAGNOSIS — I4891 Unspecified atrial fibrillation: Secondary | ICD-10-CM | POA: Diagnosis not present

## 2020-08-05 DIAGNOSIS — Y999 Unspecified external cause status: Secondary | ICD-10-CM | POA: Insufficient documentation

## 2020-08-05 DIAGNOSIS — Y939 Activity, unspecified: Secondary | ICD-10-CM | POA: Insufficient documentation

## 2020-08-05 DIAGNOSIS — Z79899 Other long term (current) drug therapy: Secondary | ICD-10-CM | POA: Diagnosis not present

## 2020-08-05 DIAGNOSIS — S51812A Laceration without foreign body of left forearm, initial encounter: Secondary | ICD-10-CM | POA: Diagnosis present

## 2020-08-05 DIAGNOSIS — Y929 Unspecified place or not applicable: Secondary | ICD-10-CM | POA: Diagnosis not present

## 2020-08-05 DIAGNOSIS — F17210 Nicotine dependence, cigarettes, uncomplicated: Secondary | ICD-10-CM | POA: Diagnosis not present

## 2020-08-05 DIAGNOSIS — W268XXA Contact with other sharp object(s), not elsewhere classified, initial encounter: Secondary | ICD-10-CM | POA: Diagnosis not present

## 2020-08-05 MED ORDER — LIDOCAINE-EPINEPHRINE-TETRACAINE (LET) TOPICAL GEL
3.0000 mL | Freq: Once | TOPICAL | Status: AC
  Administered 2020-08-05: 3 mL via TOPICAL
  Filled 2020-08-05: qty 3

## 2020-08-05 NOTE — ED Notes (Signed)
See triage note  States his car door hit his left forearm  Skin tear noted

## 2020-08-05 NOTE — ED Notes (Signed)
LET applied by Durward Parcel PA-C. PA wrapped wound with gauze and kling after the repair.

## 2020-08-05 NOTE — Discharge Instructions (Signed)
Daily dressing change as directed.  No creams or ointment to Dermabond area.

## 2020-08-05 NOTE — ED Triage Notes (Signed)
Pt reports opened car door and it his his left arm and pt now with skin tear to the left arm. Area wrapped.

## 2020-08-05 NOTE — ED Provider Notes (Signed)
[facility] Emergency Department Provider Note   ____________________________________________   First MD Initiated Contact with Patient 08/05/20 1140     (approximate)  I have reviewed the triage vital signs and the nursing notes.   HISTORY  Chief Complaint skin tear    HPI Roger [name] is a 74 y.o. male patient presents with skin tear to the left forearm.  Incident occurred while open car door.  Bleeding is controlled with direct pressure.  Patient denies loss sensation loss of function.  Patient denies pain with complaint.  Area was bandaged in triage.         Past Medical History:  Diagnosis Date   Anemia    Arthritis    Atrial fibrillation (HCC)    Bladder stone    Dysrhythmia    GERD (gastroesophageal reflux disease)    Hypertension     Patient Active Problem List   Diagnosis Date Noted   Hypotension 09/01/2019   Ventricular fibrillation (HCC) 04/19/2019    Past Surgical History:  Procedure Laterality Date   CATARACT EXTRACTION     CORONARY STENT INTERVENTION N/A 04/20/2019   Procedure: CORONARY STENT INTERVENTION;  Surgeon: Alwyn Pea, MD;  Location: [facility] INVASIVE CV LAB;  Service: Cardiovascular;  Laterality: N/A;   CYSTOSCOPY WITH LITHOLAPAXY N/A 10/06/2019   Procedure: CYSTOSCOPY WITH LITHOLAPAXY;  Surgeon: Sondra Come, MD;  Location: [facility] ORS;  Service: Urology;  Laterality: N/A;   HIP SURGERY     HOLEP-LASER ENUCLEATION OF THE PROSTATE WITH MORCELLATION N/A 10/06/2019   Procedure: HOLEP-LASER ENUCLEATION OF THE PROSTATE WITH MORCELLATION;  Surgeon: Sondra Come, MD;  Location: [facility] ORS;  Service: Urology;  Laterality: N/A;   JOINT REPLACEMENT Right 2012   hip right   LEFT HEART CATH AND CORONARY ANGIOGRAPHY Left 04/20/2019   Procedure: LEFT HEART CATH AND CORONARY ANGIOGRAPHY;  Surgeon: Laurier Nancy, MD;  Location: [facility] INVASIVE CV LAB;  Service: Cardiovascular;  Laterality: Left;     Prior to Admission medications   Medication Sig Start Date End Date Taking? Authorizing Provider  azelastine (ASTELIN) 0.1 % nasal spray Place 1 spray into both nostrils 2 (two) times daily. 05/03/20   [provider]  Bempedoic Acid-Ezetimibe (NEXLIZET) 180-10 MG TABS Take 1 tablet by mouth daily at 6 (six) AM.    [provider]  Cholecalciferol (VITAMIN D3) 1.25 MG (50000 UT) CAPS Take 50,000 Units by mouth every Thursday.  05/02/19   [provider]  ELIQUIS 2.5 MG TABS tablet Take 2.5 mg by mouth 2 (two) times daily. 04/12/20   [provider]  ergocalciferol (VITAMIN D2) 1.25 MG (50000 UT) capsule Take 50,000 Units by mouth once a week. Thursday    [provider]  ezetimibe (ZETIA) 10 MG tablet Take 10 mg by mouth daily. 08/13/19   [provider]  ketotifen (ZADITOR) 0.025 % ophthalmic solution Place 1 drop into both eyes daily as needed (allergies).    [provider]  metoprolol tartrate (LOPRESSOR) 25 MG tablet Take 25 mg by mouth 2 (two) times daily.    [provider]  pantoprazole (PROTONIX) 40 MG tablet Take 1 tablet (40 mg total) by mouth daily. Patient taking differently: Take 40 mg by mouth daily as needed (acid reflux).  04/21/19   Adrian Saran, MD  sacubitril-valsartan (ENTRESTO) 24-26 MG Take 0.5 tablets by mouth 2 (two) times daily.    [provider]  spironolactone (ALDACTONE) 25 MG tablet Take 25 mg by mouth  daily. 08/13/19   [provider]  lisinopril (PRINIVIL,ZESTRIL) 10 MG tablet Take 5 mg by mouth daily.   06/10/19  [provider]    Allergies Amoxicillin, Sulfa antibiotics, Ibuprofen, Pravastatin, and Rosuvastatin  No family history on file.  Social History Social History   Tobacco Use   Smoking status: Current Every Day Smoker    Packs/day: 0.50    Types: Cigarettes   Smokeless tobacco: Never Used  Vaping Use   Vaping Use: Never used  Substance Use Topics    Alcohol use: Yes    Comment: rare   Drug use: No    Review of Systems Constitutional: No fever/chills Eyes: No visual changes. ENT: No sore throat. Cardiovascular: Denies chest pain. Respiratory: Denies shortness of breath. Gastrointestinal: No abdominal pain.  No nausea, no vomiting.  No diarrhea.  No constipation. Genitourinary: Negative for dysuria. Musculoskeletal: Negative for back pain. Skin: Negative for rash.  Skin tear right forearm. Neurological: Negative for headaches, focal weakness or numbness. Endocrine:  Hypertension. Allergic/Immunilogical: Amoxicillin, ibuprofen, sulfa antibiotics, and statins. ____________________________________________   PHYSICAL EXAM:  VITAL SIGNS: ED Triage Vitals  Enc Vitals Group     BP 08/05/20 1047 (!) 124/52     Pulse Rate 08/05/20 1047 75     Resp 08/05/20 1047 20     Temp 08/05/20 1047 98.1 F (36.7 C)     Temp Source 08/05/20 1047 Oral     SpO2 08/05/20 1049 96 %     Weight 08/05/20 1048 220 lb (99.8 kg)     Height 08/05/20 1048 5\' 8"  (1.727 m)     Head Circumference --      Peak Flow --      Pain Score 08/05/20 1047 0     Pain Loc --      Pain Edu? --      Excl. in GC? --     Constitutional: Alert and oriented. Well appearing and in no acute distress. Cardiovascular: Normal rate, regular rhythm. Grossly normal heart sounds.  Good peripheral circulation. Respiratory: Normal respiratory effort.  No retractions. Lungs CTAB. Skin: Skin tear right forearm.  No rash noted. Psychiatric: Mood and affect are normal. Speech and behavior are normal.  ____________________________________________   LABS (all labs ordered are listed, but only abnormal results are displayed)  Labs Reviewed - No data to display ____________________________________________  EKG   ____________________________________________  RADIOLOGY  ED MD interpretation:    Official radiology report(s): No results  found.  ____________________________________________   PROCEDURES  Procedure(s) performed (including Critical Care):  Procedures   ____________________________________________   INITIAL IMPRESSION / ASSESSMENT AND PLAN / ED COURSE  As part of my medical decision making, I reviewed the following data within the electronic MEDICAL RECORD NUMBER     Skin tear was approximated using Dermabond.  Patient given discharge care instructions.  Patient advised follow-up PCP.    HEYWOOD TOKUNAGA was evaluated in Emergency Department on 08/05/2020 for the symptoms described in the history of present illness. He was evaluated in the context of the global COVID-19 pandemic, which necessitated consideration that the patient might be at risk for infection with the SARS-CoV-2 virus that causes COVID-19. Institutional protocols and algorithms that pertain to the evaluation of patients at risk for COVID-19 are in a state of rapid change based on information released by regulatory bodies including the CDC and federal and state organizations. These policies and algorithms were followed during the patient's care in the ED.  ____________________________________________   FINAL CLINICAL IMPRESSION(S) / ED DIAGNOSES  Final diagnoses:  Skin tear of left forearm without complication, initial encounter     ED Discharge Orders    None       Note:  This document was prepared using Dragon voice recognition software and may include unintentional dictation errors.    Joni Reining, PA-C 08/05/20 1216    Delton [place], MD 08/05/20 [phone]

## 2020-08-06 ENCOUNTER — Other Ambulatory Visit: Payer: Self-pay

## 2020-08-06 ENCOUNTER — Encounter: Payer: Self-pay | Admitting: Emergency Medicine

## 2020-08-06 DIAGNOSIS — S59912A Unspecified injury of left forearm, initial encounter: Secondary | ICD-10-CM | POA: Diagnosis present

## 2020-08-06 DIAGNOSIS — Z79899 Other long term (current) drug therapy: Secondary | ICD-10-CM | POA: Insufficient documentation

## 2020-08-06 DIAGNOSIS — Z7901 Long term (current) use of anticoagulants: Secondary | ICD-10-CM | POA: Insufficient documentation

## 2020-08-06 DIAGNOSIS — I10 Essential (primary) hypertension: Secondary | ICD-10-CM | POA: Diagnosis not present

## 2020-08-06 DIAGNOSIS — S50812A Abrasion of left forearm, initial encounter: Secondary | ICD-10-CM | POA: Insufficient documentation

## 2020-08-06 DIAGNOSIS — Y9389 Activity, other specified: Secondary | ICD-10-CM | POA: Diagnosis not present

## 2020-08-06 DIAGNOSIS — W231XXA Caught, crushed, jammed, or pinched between stationary objects, initial encounter: Secondary | ICD-10-CM | POA: Insufficient documentation

## 2020-08-06 DIAGNOSIS — Y999 Unspecified external cause status: Secondary | ICD-10-CM | POA: Insufficient documentation

## 2020-08-06 DIAGNOSIS — F17210 Nicotine dependence, cigarettes, uncomplicated: Secondary | ICD-10-CM | POA: Diagnosis not present

## 2020-08-06 DIAGNOSIS — S51812A Laceration without foreign body of left forearm, initial encounter: Secondary | ICD-10-CM | POA: Diagnosis not present

## 2020-08-06 DIAGNOSIS — Y929 Unspecified place or not applicable: Secondary | ICD-10-CM | POA: Diagnosis not present

## 2020-08-06 NOTE — ED Triage Notes (Signed)
Pt to ED from home c/o left forearm abrasion, was seen yesterday after arm was shut in car door.  Bandage in place from yesterday, bleeding on gauze, pt on Eliquis, no active bleeding noted at this time.

## 2020-08-07 ENCOUNTER — Emergency Department
Admission: EM | Admit: 2020-08-07 | Discharge: 2020-08-07 | Disposition: A | Discharge status: Home / Self Care | Payer: Medicare Other | Attending: Emergency Medicine | Admitting: Emergency Medicine

## 2020-08-07 DIAGNOSIS — S51812A Laceration without foreign body of left forearm, initial encounter: Secondary | ICD-10-CM

## 2020-08-07 NOTE — ED Provider Notes (Signed)
[facility] Emergency Department Provider Note  ____________________________________________   First MD Initiated Contact with Patient 08/07/20 [phone]     (approximate)  I have reviewed the triage vital signs and the nursing notes.   HISTORY  Chief Complaint Abrasion   HPI Roger [name] is a 74 y.o. male with below list of previous medical conditions presents to the emergency department secondary to reevaluation for abrasion to the left forearm which patient sustained yesterday while closing a car door.  Patient was seen in the emergency department the wound was dressed.  Patient states that bleeding had resolved however recurred this evening.        Past Medical History:  Diagnosis Date  . Anemia   . Arthritis   . Atrial fibrillation (HCC)   . Bladder stone   . Dysrhythmia   . GERD (gastroesophageal reflux disease)   . Hypertension     Patient Active Problem List   Diagnosis Date Noted  . Hypotension 09/01/2019  . Ventricular fibrillation (HCC) 04/19/2019    Past Surgical History:  Procedure Laterality Date  . CATARACT EXTRACTION    . CORONARY STENT INTERVENTION N/A 04/20/2019   Procedure: CORONARY STENT INTERVENTION;  Surgeon: Alwyn Pea, MD;  Location: [facility] INVASIVE CV LAB;  Service: Cardiovascular;  Laterality: N/A;  . CYSTOSCOPY WITH LITHOLAPAXY N/A 10/06/2019   Procedure: CYSTOSCOPY WITH LITHOLAPAXY;  Surgeon: Sondra Come, MD;  Location: [facility] ORS;  Service: Urology;  Laterality: N/A;  . HIP SURGERY    . HOLEP-LASER ENUCLEATION OF THE PROSTATE WITH MORCELLATION N/A 10/06/2019   Procedure: HOLEP-LASER ENUCLEATION OF THE PROSTATE WITH MORCELLATION;  Surgeon: Sondra Come, MD;  Location: [facility] ORS;  Service: Urology;  Laterality: N/A;  . JOINT REPLACEMENT Right 2012   hip right  . LEFT HEART CATH AND CORONARY ANGIOGRAPHY Left 04/20/2019   Procedure: LEFT HEART CATH AND CORONARY ANGIOGRAPHY;  Surgeon: Laurier Nancy, MD;   Location: [facility] INVASIVE CV LAB;  Service: Cardiovascular;  Laterality: Left;    Prior to Admission medications   Medication Sig Start Date End Date Taking? Authorizing Provider  azelastine (ASTELIN) 0.1 % nasal spray Place 1 spray into both nostrils 2 (two) times daily. 05/03/20   [provider]  Bempedoic Acid-Ezetimibe (NEXLIZET) 180-10 MG TABS Take 1 tablet by mouth daily at 6 (six) AM.    [provider]  Cholecalciferol (VITAMIN D3) 1.25 MG (50000 UT) CAPS Take 50,000 Units by mouth every Thursday.  05/02/19   [provider]  ELIQUIS 2.5 MG TABS tablet Take 2.5 mg by mouth 2 (two) times daily. 04/12/20   [provider]  ergocalciferol (VITAMIN D2) 1.25 MG (50000 UT) capsule Take 50,000 Units by mouth once a week. Thursday    [provider]  ezetimibe (ZETIA) 10 MG tablet Take 10 mg by mouth daily. 08/13/19   [provider]  ketotifen (ZADITOR) 0.025 % ophthalmic solution Place 1 drop into both eyes daily as needed (allergies).    [provider]  metoprolol tartrate (LOPRESSOR) 25 MG tablet Take 25 mg by mouth 2 (two) times daily.    [provider]  pantoprazole (PROTONIX) 40 MG tablet Take 1 tablet (40 mg total) by mouth daily. Patient taking differently: Take 40 mg by mouth daily as needed (acid reflux).  04/21/19   Adrian Saran, MD  sacubitril-valsartan (ENTRESTO) 24-26 MG Take 0.5 tablets by mouth 2 (two) times daily.    [provider]  spironolactone (ALDACTONE) 25 MG  tablet Take 25 mg by mouth daily. 08/13/19   [provider]  lisinopril (PRINIVIL,ZESTRIL) 10 MG tablet Take 5 mg by mouth daily.   06/10/19  [provider]    Allergies Amoxicillin, Sulfa antibiotics, Ibuprofen, Pravastatin, and Rosuvastatin  History reviewed. No pertinent family history.  Social History Social History   Tobacco Use  . Smoking status: Current Every Day Smoker    Packs/day: 0.50    Types: Cigarettes    . Smokeless tobacco: Never Used  Vaping Use  . Vaping Use: Never used  Substance Use Topics  . Alcohol use: Yes    Comment: rare  . Drug use: No    Review of Systems Constitutional: No fever/chills Eyes: No visual changes. ENT: No sore throat. Cardiovascular: Denies chest pain. Respiratory: Denies shortness of breath. Gastrointestinal: No abdominal pain.  No nausea, no vomiting.  No diarrhea.  No constipation. Genitourinary: Negative for dysuria. Musculoskeletal: Negative for neck pain.  Negative for back pain. Integumentary: Positive for left forearm abrasion Neurological: Negative for headaches, focal weakness or numbness.   ____________________________________________   PHYSICAL EXAM:  VITAL SIGNS: ED Triage Vitals [08/06/20 2034]  Enc Vitals Group     BP (!) 147/103     Pulse Rate 82     Resp 16     Temp 97.8 F (36.6 C)     Temp Source Oral     SpO2 97 %     Weight 99.8 kg (220 lb)     Height 1.727 m (5\' 8" )     Head Circumference      Peak Flow      Pain Score 0     Pain Loc      Pain Edu?      Excl. in GC?     Constitutional: Alert and oriented.  Eyes: Conjunctivae are normal.  Head: Atraumatic. Mouth/Throat: Patient is wearing a mask. Neck: No stridor.   Musculoskeletal: No lower extremity tenderness nor edema. No gross deformities of extremities. Neurologic:  Normal speech and language. No gross focal neurologic deficits are appreciated.  Skin: 6 x 3 cm skin tear to the left forearm with scant bleeding at present. Psychiatric: Mood and affect are normal. Speech and behavior are normal.   Procedures   ____________________________________________   INITIAL IMPRESSION / MDM / ASSESSMENT AND PLAN / ED COURSE  As part of my medical decision making, I reviewed the following data within the electronic MEDICAL RECORD NUMBER   74 year old male presented with above-stated history and physical exam secondary to left forearm skin tear.  Surgicel was  applied to the left forearm skin tear dressing applied above the Surgicel.  Spoke with the patient at length regarding management at home.  Patient given supplies for dressing change. ____________________________________________  FINAL CLINICAL IMPRESSION(S) / ED DIAGNOSES  Final diagnoses:  Skin tear of left forearm without complication, initial encounter     MEDICATIONS GIVEN DURING THIS VISIT:  Medications - No data to display   ED Discharge Orders    None      *Please note:  Roger [name] was evaluated in Emergency Department on 08/07/2020 for the symptoms described in the history of present illness. He was evaluated in the context of the global COVID-19 pandemic, which necessitated consideration that the patient might be at risk for infection with the SARS-CoV-2 virus that causes COVID-19. Institutional protocols and algorithms that pertain to the evaluation of patients at risk for COVID-19 are in a state of rapid change based  on information released by regulatory bodies including the CDC and federal and state organizations. These policies and algorithms were followed during the patient's care in the ED.  Some ED evaluations and interventions may be delayed as a result of limited staffing during and after the pandemic.*  Note:  This document was prepared using Dragon voice recognition software and may include unintentional dictation errors.   Darci Current, MD 08/07/20 [phone]

## 2020-11-15 ENCOUNTER — Emergency Department
Admission: EM | Admit: 2020-11-15 | Discharge: 2020-11-15 | Disposition: A | Discharge status: Home / Self Care | Payer: Medicare Other | Attending: Emergency Medicine | Admitting: Emergency Medicine

## 2020-11-15 ENCOUNTER — Encounter: Payer: Self-pay | Admitting: Emergency Medicine

## 2020-11-15 ENCOUNTER — Other Ambulatory Visit: Payer: Self-pay

## 2020-11-15 DIAGNOSIS — I10 Essential (primary) hypertension: Secondary | ICD-10-CM | POA: Insufficient documentation

## 2020-11-15 DIAGNOSIS — Z5189 Encounter for other specified aftercare: Secondary | ICD-10-CM

## 2020-11-15 DIAGNOSIS — L97919 Non-pressure chronic ulcer of unspecified part of right lower leg with unspecified severity: Secondary | ICD-10-CM | POA: Insufficient documentation

## 2020-11-15 DIAGNOSIS — F17210 Nicotine dependence, cigarettes, uncomplicated: Secondary | ICD-10-CM | POA: Insufficient documentation

## 2020-11-15 DIAGNOSIS — Z4800 Encounter for change or removal of nonsurgical wound dressing: Secondary | ICD-10-CM | POA: Diagnosis not present

## 2020-11-15 DIAGNOSIS — Z79899 Other long term (current) drug therapy: Secondary | ICD-10-CM | POA: Insufficient documentation

## 2020-11-15 DIAGNOSIS — Z96641 Presence of right artificial hip joint: Secondary | ICD-10-CM | POA: Insufficient documentation

## 2020-11-15 LAB — BASIC METABOLIC PANEL
Anion gap: 11 (ref 5–15)
BUN: 25 mg/dL — ABNORMAL HIGH (ref 8–23)
CO2: 24 mmol/L (ref 22–32)
Calcium: 9 mg/dL (ref 8.9–10.3)
Chloride: 102 mmol/L (ref 98–111)
Creatinine, Ser: 0.92 mg/dL (ref 0.61–1.24)
GFR, Estimated: >60 mL/min (ref >60)
Glucose, Bld: 94 mg/dL (ref 70–99)
Potassium: 4.3 mmol/L (ref 3.5–5.1)
Sodium: 137 mmol/L (ref 135–145)

## 2020-11-15 LAB — CBC WITH DIFFERENTIAL/PLATELET
Abs Immature Granulocytes: 0.05 10*3/uL (ref 0.00–0.07)
Basophils Absolute: 0.1 10*3/uL (ref 0.0–0.1)
Basophils Relative: 1 %
Eosinophils Absolute: 0.1 10*3/uL (ref 0.0–0.5)
Eosinophils Relative: 1 %
HCT: 44.7 % (ref 39.0–52.0)
Hemoglobin: 14.7 g/dL (ref 13.0–17.0)
Immature Granulocytes: 1 %
Lymphocytes Relative: 11 %
Lymphs Abs: 1.1 10*3/uL (ref 0.7–4.0)
MCH: 32.1 pg (ref 26.0–34.0)
MCHC: 32.9 g/dL (ref 30.0–36.0)
MCV: 97.6 fL (ref 80.0–100.0)
Monocytes Absolute: 0.7 10*3/uL (ref 0.1–1.0)
Monocytes Relative: 7 %
Neutro Abs: 7.8 10*3/uL — ABNORMAL HIGH (ref 1.7–7.7)
Neutrophils Relative %: 79 %
Platelets: 264 10*3/uL (ref 150–400)
RBC: 4.58 MIL/uL (ref 4.22–5.81)
RDW: 14.6 % (ref 11.5–15.5)
WBC: 9.7 10*3/uL (ref 4.0–10.5)
nRBC: 0 % (ref 0.0–0.2)

## 2020-11-15 NOTE — ED Provider Notes (Signed)
[facility] Emergency Department Provider Note   ____________________________________________    I have reviewed the triage vital signs and the nursing notes.   HISTORY  Chief Complaint Wound Check     HPI Roger [name] is a 74 y.o. male who presents for wound check.  Patient has a history as noted below.  He reports shallow ulceration to the right shin for which she is on doxycycline.  When he took off his bandage this morning he noted yellowish discharge and was concerned for possible infection.  He reports it is less red than it was yesterday.  Denies fevers or chills.  No increased pain  Past Medical History:  Diagnosis Date  . Anemia   . Arthritis   . Atrial fibrillation (HCC)   . Bladder stone   . Dysrhythmia   . GERD (gastroesophageal reflux disease)   . Hypertension     Patient Active Problem List   Diagnosis Date Noted  . Hypotension 09/01/2019  . Ventricular fibrillation (HCC) 04/19/2019    Past Surgical History:  Procedure Laterality Date  . CATARACT EXTRACTION    . CORONARY STENT INTERVENTION N/A 04/20/2019   Procedure: CORONARY STENT INTERVENTION;  Surgeon: Alwyn Pea, MD;  Location: [facility] INVASIVE CV LAB;  Service: Cardiovascular;  Laterality: N/A;  . CYSTOSCOPY WITH LITHOLAPAXY N/A 10/06/2019   Procedure: CYSTOSCOPY WITH LITHOLAPAXY;  Surgeon: Sondra Come, MD;  Location: [facility] ORS;  Service: Urology;  Laterality: N/A;  . HIP SURGERY    . HOLEP-LASER ENUCLEATION OF THE PROSTATE WITH MORCELLATION N/A 10/06/2019   Procedure: HOLEP-LASER ENUCLEATION OF THE PROSTATE WITH MORCELLATION;  Surgeon: Sondra Come, MD;  Location: [facility] ORS;  Service: Urology;  Laterality: N/A;  . JOINT REPLACEMENT Right 2012   hip right  . LEFT HEART CATH AND CORONARY ANGIOGRAPHY Left 04/20/2019   Procedure: LEFT HEART CATH AND CORONARY ANGIOGRAPHY;  Surgeon: Laurier Nancy, MD;  Location: [facility] INVASIVE CV LAB;  Service: Cardiovascular;   Laterality: Left;    Prior to Admission medications   Medication Sig Start Date End Date Taking? Authorizing Provider  azelastine (ASTELIN) 0.1 % nasal spray Place 1 spray into both nostrils 2 (two) times daily. 05/03/20   [provider]  Bempedoic Acid-Ezetimibe (NEXLIZET) 180-10 MG TABS Take 1 tablet by mouth daily at 6 (six) AM.    [provider]  Cholecalciferol (VITAMIN D3) 1.25 MG (50000 UT) CAPS Take 50,000 Units by mouth every Thursday.  05/02/19   [provider]  ELIQUIS 2.5 MG TABS tablet Take 2.5 mg by mouth 2 (two) times daily. 04/12/20   [provider]  ergocalciferol (VITAMIN D2) 1.25 MG (50000 UT) capsule Take 50,000 Units by mouth once a week. Thursday    [provider]  ezetimibe (ZETIA) 10 MG tablet Take 10 mg by mouth daily. 08/13/19   [provider]  ketotifen (ZADITOR) 0.025 % ophthalmic solution Place 1 drop into both eyes daily as needed (allergies).    [provider]  metoprolol tartrate (LOPRESSOR) 25 MG tablet Take 25 mg by mouth 2 (two) times daily.    [provider]  pantoprazole (PROTONIX) 40 MG tablet Take 1 tablet (40 mg total) by mouth daily. Patient taking differently: Take 40 mg by mouth daily as needed (acid reflux).  04/21/19   Adrian Saran, MD  sacubitril-valsartan (ENTRESTO) 24-26 MG Take 0.5 tablets by mouth 2 (two) times daily.    [provider]  spironolactone (ALDACTONE) 25 MG  tablet Take 25 mg by mouth daily. 08/13/19   [provider]  lisinopril (PRINIVIL,ZESTRIL) 10 MG tablet Take 5 mg by mouth daily.   06/10/19  [provider]     Allergies Amoxicillin, Sulfa antibiotics, Ibuprofen, Pravastatin, and Rosuvastatin  History reviewed. No pertinent family history.  Social History Social History   Tobacco Use  . Smoking status: Current Every Day Smoker    Packs/day: 0.50    Types: Cigarettes  . Smokeless tobacco: Never Used  Vaping Use  . Vaping  Use: Never used  Substance Use Topics  . Alcohol use: Yes    Comment: rare  . Drug use: No    Review of Systems  Constitutional: No fever/chills     Gastrointestinal: No abdominal pain.    Musculoskeletal: No lower extremity swelling Skin: As above Neurological: Negative for headaches     ____________________________________________   PHYSICAL EXAM:  VITAL SIGNS: ED Triage Vitals  Enc Vitals Group     BP 11/15/20 0931 (!) 136/93     Pulse Rate 11/15/20 0931 73     Resp 11/15/20 0931 (!) 22     Temp 11/15/20 0931 97.9 F (36.6 C)     Temp Source 11/15/20 0931 Oral     SpO2 11/15/20 0931 100 %     Weight 11/15/20 0932 99.8 kg (220 lb)     Height 11/15/20 0932 1.727 m (5\' 8" )     Head Circumference --      Peak Flow --      Pain Score 11/15/20 0932 0     Pain Loc --      Pain Edu? --      Excl. in GC? --     Constitutional: Alert and oriented. No acute distress.   Nose: No congestion/rhinnorhea. Mouth/Throat: Mucous membranes are moist.   Cardiovascular: Normal rate, regular rhythm.  Respiratory: Normal respiratory effort.  No retractions. Genitourinary: deferred Musculoskeletal: No lower extremity tenderness nor edema.   Neurologic:  Normal speech and language. No gross focal neurologic deficits are appreciated.   Skin:  Skin is warm, dry.  Right shin: 2 x 2 centimeter shallow ulceration with granulation tissue, minimal erythema surrounding, not consistent with cellulitis, no discharge.   ____________________________________________   LABS (all labs ordered are listed, but only abnormal results are displayed)  Labs Reviewed  CBC WITH DIFFERENTIAL/PLATELET - Abnormal; Notable for the following components:      Result Value   Neutro Abs 7.8 (*)    All other components within normal limits  BASIC METABOLIC PANEL - Abnormal; Notable for the following components:   BUN 25 (*)    All other components within normal limits    ____________________________________________  EKG   ____________________________________________  RADIOLOGY  None ____________________________________________   PROCEDURES  Procedure(s) performed: No  Procedures   Critical Care performed: No ____________________________________________   INITIAL IMPRESSION / ASSESSMENT AND PLAN / ED COURSE  Pertinent labs & imaging results that were available during my care of the patient were reviewed by me and considered in my medical decision making (see chart for details).  Patient overall well-appearing and in no acute distress.  Ulceration appears to be healing nicely with granulation tissue, no evidence of cellulitis or discharge.  Lab work is unremarkable, patient is afebrile.  Recommend continuing doxycycline, outpatient follow-up for wound check   ____________________________________________   FINAL CLINICAL IMPRESSION(S) / ED DIAGNOSES  Final diagnoses:  Visit for wound check      NEW MEDICATIONS STARTED DURING  THIS VISIT:  Discharge Medication List as of 11/15/2020 11:45 AM       Note:  This document was prepared using Dragon voice recognition software and may include unintentional dictation errors.   Jene Every, MD 11/15/20 [phone]

## 2020-11-15 NOTE — ED Triage Notes (Signed)
Pt to ED via POV, states saw KC last week for wound, states continued oozing to wound on R distal shin. Pt states yellow/green drainage at this time. Pt states has been on abx for wound. Pt with noted redness to distal shin.

## 2021-06-05 ENCOUNTER — Ambulatory Visit: Payer: Self-pay | Admitting: Urology

## 2021-06-11 ENCOUNTER — Ambulatory Visit (INDEPENDENT_AMBULATORY_CARE_PROVIDER_SITE_OTHER): Payer: Medicare Other | Admitting: Urology

## 2021-06-11 ENCOUNTER — Other Ambulatory Visit: Payer: Self-pay

## 2021-06-11 ENCOUNTER — Encounter: Payer: Self-pay | Admitting: Urology

## 2021-06-11 VITALS — BP 161/96 | HR 93 | Ht 68.0 in | Wt 228.0 lb

## 2021-06-11 DIAGNOSIS — N401 Enlarged prostate with lower urinary tract symptoms: Secondary | ICD-10-CM | POA: Diagnosis not present

## 2021-06-11 DIAGNOSIS — N138 Other obstructive and reflux uropathy: Secondary | ICD-10-CM

## 2021-06-11 LAB — BLADDER SCAN AMB NON-IMAGING

## 2021-06-11 NOTE — Progress Notes (Signed)
   06/11/2021 2:32 PM   Roger [name] [birth year] [id number]  Reason for visit: Follow up BPH status post HOLEP  HPI: I saw Roger [name] in urology clinic today in follow-up after HoLEP and cystolitholapaxy.  He is a comorbid and frail 75 year old male on Eliquis for CAD who underwent an uncomplicated HoLEP on 10/06/2019 for BPH with Foley dependent urinary retention and bladder stone. 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 He has been able to void after surgery with low PVRs.   PVR today is 0 mL.   He reports he has been doing very well since surgery. He denies any gross hematuria over the last year.  He occasionally will have a small leakage of urine if he gets up quickly, but this is minimally bothersome.  He really denies any urinary complaints today and is doing well.  Return precautions discussed, can follow-up with urology as needed.    Sondra Come, MD  [facility] [address] [place], [place] [address] [phone]

## 2021-10-11 ENCOUNTER — Encounter: Payer: Self-pay | Admitting: Emergency Medicine

## 2021-10-11 ENCOUNTER — Emergency Department
Admission: EM | Admit: 2021-10-11 | Discharge: 2021-10-11 | Disposition: A | Discharge status: Home / Self Care | Payer: Medicare Other | Attending: Emergency Medicine | Admitting: Emergency Medicine

## 2021-10-11 ENCOUNTER — Other Ambulatory Visit: Payer: Self-pay

## 2021-10-11 ENCOUNTER — Emergency Department: Payer: Medicare Other

## 2021-10-11 DIAGNOSIS — Z79899 Other long term (current) drug therapy: Secondary | ICD-10-CM | POA: Diagnosis not present

## 2021-10-11 DIAGNOSIS — Y92481 Parking lot as the place of occurrence of the external cause: Secondary | ICD-10-CM | POA: Diagnosis not present

## 2021-10-11 DIAGNOSIS — S60511A Abrasion of right hand, initial encounter: Secondary | ICD-10-CM | POA: Insufficient documentation

## 2021-10-11 DIAGNOSIS — M79641 Pain in right hand: Secondary | ICD-10-CM

## 2021-10-11 DIAGNOSIS — M25561 Pain in right knee: Secondary | ICD-10-CM

## 2021-10-11 DIAGNOSIS — Y9301 Activity, walking, marching and hiking: Secondary | ICD-10-CM | POA: Diagnosis not present

## 2021-10-11 DIAGNOSIS — I10 Essential (primary) hypertension: Secondary | ICD-10-CM | POA: Insufficient documentation

## 2021-10-11 DIAGNOSIS — S0083XA Contusion of other part of head, initial encounter: Secondary | ICD-10-CM

## 2021-10-11 DIAGNOSIS — S80211A Abrasion, right knee, initial encounter: Secondary | ICD-10-CM | POA: Diagnosis not present

## 2021-10-11 DIAGNOSIS — M25562 Pain in left knee: Secondary | ICD-10-CM

## 2021-10-11 DIAGNOSIS — S0990XA Unspecified injury of head, initial encounter: Secondary | ICD-10-CM

## 2021-10-11 DIAGNOSIS — F17210 Nicotine dependence, cigarettes, uncomplicated: Secondary | ICD-10-CM | POA: Insufficient documentation

## 2021-10-11 DIAGNOSIS — W010XXA Fall on same level from slipping, tripping and stumbling without subsequent striking against object, initial encounter: Secondary | ICD-10-CM | POA: Insufficient documentation

## 2021-10-11 DIAGNOSIS — S60512A Abrasion of left hand, initial encounter: Secondary | ICD-10-CM | POA: Diagnosis not present

## 2021-10-11 DIAGNOSIS — T148XXA Other injury of unspecified body region, initial encounter: Secondary | ICD-10-CM

## 2021-10-11 DIAGNOSIS — I4891 Unspecified atrial fibrillation: Secondary | ICD-10-CM | POA: Insufficient documentation

## 2021-10-11 DIAGNOSIS — S80212A Abrasion, left knee, initial encounter: Secondary | ICD-10-CM | POA: Diagnosis not present

## 2021-10-11 DIAGNOSIS — W19XXXA Unspecified fall, initial encounter: Secondary | ICD-10-CM

## 2021-10-11 NOTE — ED Triage Notes (Signed)
Pt to ED via [facility] from [place], pt states that he was walking and tripped and fell. Pt states that he went down on both knee and both  hands and then fell forward, hitting his head. Pt has small abrasions to forehead, bleeding is controlled at this time. Pt states that he also has scraps on both knee and both his hands are sore. Pt denies LOC, pt does take blood thinners. Pt is in NAD.

## 2021-10-11 NOTE — Discharge Instructions (Addendum)
Medical history you may use ibuprofen up to 600 mg every 8 hours for the next week and/or up to 4 g of Tylenol for any continued muscle aches

## 2021-10-11 NOTE — ED Provider Notes (Signed)
Face  [facility] Emergency Department Provider Note   ____________________________________________   Event Date/Time   First MD Initiated Contact with Patient 10/11/21 1258     (approximate)  I have reviewed the triage vital signs and the nursing notes.   HISTORY  Chief Complaint Fall    HPI Roger [name] is a 75 y.o. male who presents after a mechanical fall from standing resulting in facial contusion with abrasion  LOCATION: Face and head DURATION: Just prior to arrival TIMING: Improved since onset SEVERITY: Contusion with overlying abrasion QUALITY: Aching/throbbing pain CONTEXT: Patient states that he turned a corner and lost his footing resulting in falling onto bilateral outstretched hands and hitting his face on concrete. MODIFYING FACTORS: Any palpation worsens this pain and its pressure relieved at rest ASSOCIATED SYMPTOMS: Denies loss of consciousness   Per medical record review, patient has history of A. fib on Eliquis          Past Medical History:  Diagnosis Date   Anemia    Arthritis    Atrial fibrillation (HCC)    Bladder stone    Dysrhythmia    GERD (gastroesophageal reflux disease)    Hypertension     Patient Active Problem List   Diagnosis Date Noted   Hypotension 09/01/2019   Ventricular fibrillation (HCC) 04/19/2019    Past Surgical History:  Procedure Laterality Date   CATARACT EXTRACTION     CORONARY STENT INTERVENTION N/A 04/20/2019   Procedure: CORONARY STENT INTERVENTION;  Surgeon: Alwyn Pea, MD;  Location: [facility] INVASIVE CV LAB;  Service: Cardiovascular;  Laterality: N/A;   CYSTOSCOPY WITH LITHOLAPAXY N/A 10/06/2019   Procedure: CYSTOSCOPY WITH LITHOLAPAXY;  Surgeon: Sondra Come, MD;  Location: [facility] ORS;  Service: Urology;  Laterality: N/A;   HIP SURGERY     HOLEP-LASER ENUCLEATION OF THE PROSTATE WITH MORCELLATION N/A 10/06/2019   Procedure: HOLEP-LASER ENUCLEATION OF THE PROSTATE WITH  MORCELLATION;  Surgeon: Sondra Come, MD;  Location: [facility] ORS;  Service: Urology;  Laterality: N/A;   JOINT REPLACEMENT Right 2012   hip right   LEFT HEART CATH AND CORONARY ANGIOGRAPHY Left 04/20/2019   Procedure: LEFT HEART CATH AND CORONARY ANGIOGRAPHY;  Surgeon: Laurier Nancy, MD;  Location: [facility] INVASIVE CV LAB;  Service: Cardiovascular;  Laterality: Left;    Prior to Admission medications   Medication Sig Start Date End Date Taking? Authorizing Provider  azelastine (ASTELIN) 0.1 % nasal spray Place into the nose. 04/06/21   [provider]  Bempedoic Acid 180 MG TABS Take 1 tablet by mouth daily. 03/21/21   [provider]  Bempedoic Acid-Ezetimibe (NEXLIZET) 180-10 MG TABS Take 1 tablet by mouth daily at 6 (six) AM.    [provider]  Cholecalciferol (VITAMIN D3) 1.25 MG (50000 UT) CAPS Take 50,000 Units by mouth every Thursday.  05/02/19   [provider]  Cholecalciferol 25 MCG (1000 UT) tablet Take 1 tablet by mouth daily. 03/12/15   [provider]  ELIQUIS 2.5 MG TABS tablet Take 2.5 mg by mouth 2 (two) times daily. 04/12/20   [provider]  ergocalciferol (VITAMIN D2) 1.25 MG (50000 UT) capsule Take 50,000 Units by mouth once a week. Thursday    [provider]  ezetimibe (ZETIA) 10 MG tablet Take 10 mg by mouth daily. 08/13/19   [provider]  FARXIGA 10 MG TABS tablet Take 10 mg by mouth daily. 05/24/21   [provider]  ketoconazole (NIZORAL) 2 % shampoo  Apply topically. 03/24/21   [provider]  ketotifen (ZADITOR) 0.025 % ophthalmic solution Place 1 drop into both eyes daily as needed (allergies).    [provider]  metoprolol tartrate (LOPRESSOR) 25 MG tablet Take 25 mg by mouth 2 (two) times daily.    [provider]  mupirocin ointment (BACTROBAN) 2 % Apply topically. 11/06/20   [provider]  pantoprazole (PROTONIX) 40 MG tablet Take 1 tablet (40 mg  total) by mouth daily. Patient taking differently: Take 40 mg by mouth daily as needed (acid reflux).  04/21/19   Adrian Saran, MD  REPATHA SURECLICK 140 MG/ML SOAJ SMARTSIG:1 Injection SUB-Q Every 2 Weeks 05/23/21   [provider]  sacubitril-valsartan (ENTRESTO) 24-26 MG Take 0.5 tablets by mouth 2 (two) times daily.    [provider]  spironolactone (ALDACTONE) 25 MG tablet Take 25 mg by mouth daily. 08/13/19   [provider]  lisinopril (PRINIVIL,ZESTRIL) 10 MG tablet Take 5 mg by mouth daily.   06/10/19  [provider]    Allergies Amoxicillin, Sulfa antibiotics, Sulfur dioxide, Penicillin g, Ibuprofen, Pravastatin, and Rosuvastatin  No family history on file.  Social History Social History   Tobacco Use   Smoking status: Every Day    Packs/day: 0.50    Types: Cigarettes   Smokeless tobacco: Never  Vaping Use   Vaping Use: Never used  Substance Use Topics   Alcohol use: Yes    Comment: rare   Drug use: No    Review of Systems Constitutional: No fever/chills Eyes: No visual changes. ENT: No sore throat.  Endorses forehead abrasion and contusion Cardiovascular: Denies chest pain. Respiratory: Denies shortness of breath. Gastrointestinal: No abdominal pain.  No nausea, no vomiting.  No diarrhea. Genitourinary: Negative for dysuria. Musculoskeletal: Negative for acute arthralgias Skin: Negative for rash. Neurological: Negative for headaches, weakness/numbness/paresthesias in any extremity Psychiatric: Negative for suicidal ideation/homicidal ideation   ____________________________________________   PHYSICAL EXAM:  VITAL SIGNS: ED Triage Vitals  Enc Vitals Group     BP 10/11/21 1112 133/88     Pulse Rate 10/11/21 1112 61     Resp 10/11/21 1112 16     Temp 10/11/21 1112 97.7 F (36.5 C)     Temp Source 10/11/21 1112 Oral     SpO2 10/11/21 1112 96 %     Weight 10/11/21 1115 228 lb (103.4 kg)     Height 10/11/21 1115   (1.727 m)     Head Circumference --      Peak Flow --      Pain Score 10/11/21 1115 2     Pain Loc --      Pain Edu? --      Excl. in GC? --    Constitutional: Alert and oriented. Well appearing and in no acute distress. Eyes: Conjunctivae are normal. PERRL. Head: Central forehead contusion with abrasion Nose: No congestion/rhinnorhea.  Abrasion over nasal bridge.  Nares patent bilaterally without septal hematoma Mouth/Throat: Mucous membranes are moist. Neck: No stridor Cardiovascular: Grossly normal heart sounds.  Good peripheral circulation. Respiratory: Normal respiratory effort.  No retractions. Gastrointestinal: Soft and nontender. No distention. Musculoskeletal: No obvious deformities Neurologic:  Normal speech and language. No gross focal neurologic deficits are appreciated. Skin: Abrasions over anterior bilateral knees and palms of the hands.  Skin is warm and dry. Psychiatric: Mood and affect are normal. Speech and behavior are normal.  ____________________________________________   LABS (all labs ordered are listed, but only abnormal results  are displayed)  Labs Reviewed - No data to display   RADIOLOGY  ED MD interpretation: CT of the head without contrast shows no evidence of acute abnormalities including no intracerebral hemorrhage, obvious masses, or significant edema  CT of the cervical spine does not show any evidence of acute abnormalities including no acute fracture, malalignment, height loss, or dislocation  Official radiology report(s): CT HEAD WO CONTRAST ( )  Result Date: 10/11/2021 CLINICAL DATA:  Fall, head injury.  Patient takes blood thinners. EXAM: CT HEAD WITHOUT CONTRAST CT CERVICAL SPINE WITHOUT CONTRAST TECHNIQUE: Multidetector CT imaging of the head and cervical spine was performed following the standard protocol without intravenous contrast. Multiplanar CT image reconstructions of the cervical spine were also generated. COMPARISON:  CT head  10/18/2019 FINDINGS: CT HEAD FINDINGS Brain: No evidence of acute infarction, hemorrhage, hydrocephalus, extra-axial collection or mass lesion/mass effect. Generalized parenchymal volume loss. Vascular: No hyperdense vessel or unexpected calcification. Skull: Normal. Negative for fracture or focal lesion. Sinuses/Orbits: No acute finding. Moderate mucosal thickening in a few posterior right ethmoid air cells. The other paranasal sinuses and mastoid air cells are clear. Other: A thin hematoma is noted in the subcutaneous tissues of the midline forehead measuring 3.6 cm transverse x 0.4 cm in thickness (series 2, image 14). A 1.8 x 0.9 cm soft tissue density projecting at the level of the right occipital convexity is unchanged compared to 10/18/2019, likely a sebaceous cyst (series 2, image 10). Additionally, a 4.1 x 2.1 cm subcutaneous lipoma just inferolateral to the left occiput is also unchanged compared to prior exam (series 2, image 5). CT CERVICAL SPINE FINDINGS Alignment: Trace 2 mm retrolisthesis of C4 on C5. No traumatic malalignment. Skull base and vertebrae: No acute fracture. No primary bone lesion or focal pathologic process. Soft tissues and spinal canal: No prevertebral fluid or swelling. No visible canal hematoma. 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 Mild degenerative disc disease at C2-C3 with small anterior osteophytes. Moderate degenerative disc disease at C3-C7 with loss of disc space height and anterior posterior osteophytes. Multilevel severe hypertrophic facet arthropathy at the C2-C6 levels, most severe at the left C2-C3 facet joint with complete osseous fusion. Uncovertebral spurring, in combination with facet arthropathy results in mild bilateral neural foraminal narrowing at C3-C4, the left C4-C5 foramen, and right C5-C6 foramen. There is moderate neural foraminal narrowing of the right C4-C5 and left C5-C6  foramina. Upper chest: Visualized lung apices are clear. A 1.5 x 0.8 cm superficial soft tissue density is noted just left of the midline in the upper posterior neck (series 3, image 33). Small asymmetric hyperdensity and trace amount of air are noted within the soft tissue density with small overlying skin defect. Findings may be posttraumatic or represent a sebaceous cyst. Other: Complete fatty replacement of the left parotid gland. IMPRESSION: CT head: 1. No acute intracranial abnormality. 2. Thin subcutaneous hematoma overlying the midline forehead. CT cervical spine: 1. No fracture or traumatic malalignment of the cervical spine. 2. Multilevel degenerative disc disease and facet arthropathy as described in the body of the report. Moderate neural foraminal narrowing of the right C4-C5 and left C5-C6 foramina. Electronically Signed   By: Sherron Ales M.D.   On: 10/11/2021 13:25   CT Cervical Spine Wo Contrast  Result Date: 10/11/2021 CLINICAL DATA:  Fall, head injury.  Patient takes blood thinners. EXAM: CT HEAD WITHOUT CONTRAST CT CERVICAL SPINE WITHOUT CONTRAST TECHNIQUE: Multidetector CT imaging of the  head and cervical spine was performed following the standard protocol without intravenous contrast. Multiplanar CT image reconstructions of the cervical spine were also generated. COMPARISON:  CT head 10/18/2019 FINDINGS: CT HEAD FINDINGS Brain: No evidence of acute infarction, hemorrhage, hydrocephalus, extra-axial collection or mass lesion/mass effect. Generalized parenchymal volume loss. Vascular: No hyperdense vessel or unexpected calcification. Skull: Normal. Negative for fracture or focal lesion. Sinuses/Orbits: No acute finding. Moderate mucosal thickening in a few posterior right ethmoid air cells. The other paranasal sinuses and mastoid air cells are clear. Other: A thin hematoma is noted in the subcutaneous tissues of the midline forehead measuring 3.6 cm transverse x 0.4 cm in thickness (series 2,  image 14). A 1.8 x 0.9 cm soft tissue density projecting at the level of the right occipital convexity is unchanged compared to 10/18/2019, likely a sebaceous cyst (series 2, image 10). Additionally, a 4.1 x 2.1 cm subcutaneous lipoma just inferolateral to the left occiput is also unchanged compared to prior exam (series 2, image 5). CT CERVICAL SPINE FINDINGS Alignment: Trace 2 mm retrolisthesis of C4 on C5. No traumatic malalignment. Skull base and vertebrae: No acute fracture. No primary bone lesion or focal pathologic process. Soft tissues and spinal canal: No prevertebral fluid or swelling. No visible canal hematoma. 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 Mild degenerative disc disease at C2-C3 with small anterior osteophytes. Moderate degenerative disc disease at C3-C7 with loss of disc space height and anterior posterior osteophytes. Multilevel severe hypertrophic facet arthropathy at the C2-C6 levels, most severe at the left C2-C3 facet joint with complete osseous fusion. Uncovertebral spurring, in combination with facet arthropathy results in mild bilateral neural foraminal narrowing at C3-C4, the left C4-C5 foramen, and right C5-C6 foramen. There is moderate neural foraminal narrowing of the right C4-C5 and left C5-C6 foramina. Upper chest: Visualized lung apices are clear. A 1.5 x 0.8 cm superficial soft tissue density is noted just left of the midline in the upper posterior neck (series 3, image 33). Small asymmetric hyperdensity and trace amount of air are noted within the soft tissue density with small overlying skin defect. Findings may be posttraumatic or represent a sebaceous cyst. Other: Complete fatty replacement of the left parotid gland. IMPRESSION: CT head: 1. No acute intracranial abnormality. 2. Thin subcutaneous hematoma overlying the midline forehead. CT cervical spine: 1. No fracture or traumatic malalignment of  the cervical spine. 2. Multilevel degenerative disc disease and facet arthropathy as described in the body of the report. Moderate neural foraminal narrowing of the right C4-C5 and left C5-C6 foramina. Electronically Signed   By: Sherron Ales M.D.   On: 10/11/2021 13:25    ____________________________________________   PROCEDURES  Procedure(s) performed (including Critical Care):  Procedures   ____________________________________________   INITIAL IMPRESSION / ASSESSMENT AND PLAN / ED COURSE  As part of my medical decision making, I reviewed the following data within the electronic medical record, if available:  Nursing notes reviewed and incorporated, Labs reviewed, EKG interpreted, Old chart reviewed, Radiograph reviewed and Notes from prior ED visits reviewed and incorporated        Patient presenting with head trauma.  Patient's neurological exam was non-focal and unremarkable.  [place] Head CT Rule was applied and patient did not fall into the low risk category so a head CT was obtained.  This showed no significant findings.  At this time, it is felt that the most likely explanation for the patient's symptoms is  concussion.   I also considered SAH, SDH, Epidural Hematoma, IPH, skull fracture, migraine but this appears less likely considering the data gathered thus far.   Patient provided wound care and concussion protocol.   Patient remained stable and neurologically intact while in the emergency department.  Discussed warning signs that would prompt return to ED.  Head trauma handout was provided.  Discussed in detail concussion management.  No sports or strenuous activity until symptoms free.  Return to emergency department urgently if new or worsening symptoms develop.    Impression:  Concussion Forehead contusion with abrasion  Plan  Discharge from ED Tylenol for pain control. Avoid aspirin, NSAIDs, or other blood thinners. Advised patient on supportive measures for cognitive  rest - avoid use of cognitive function for at least 24 hours.  This means no tv, books, texting, computers, etc. Limit visitors to the house.  Head trauma instructions provided in discharge instructions Instructed Pt to monitor for neurologic symptoms, severe HA, change in mental status, seizures, loss of conciousness. Instructed Pt to f/up w/ PCP in 3 days or ETC should symptoms worsen or not improve. Pt verbally expressed understanding and all questions were addressed to Pt's satisfaction.      ____________________________________________   FINAL CLINICAL IMPRESSION(S) / ED DIAGNOSES  Final diagnoses:  Fall, initial encounter  Contusion of face, initial encounter  Injury of head, initial encounter  Abrasion  Acute pain of both knees  Pain in both hands     ED Discharge Orders     None        Note:  This document was prepared using Dragon voice recognition software and may include unintentional dictation errors.    Merwyn Katos, MD 10/11/21 [phone]

## 2021-10-11 NOTE — ED Triage Notes (Signed)
Pt in via EMS from [facility] parking lot. Pt was walking through the parking lot and tripped, fell and caught himself with his hands. Pt with abrasions to palms of hands and forehead. No LOC, No dizziness, no nausea but pt does take eloquis for a-fib, HR 88, 156/81, 96% RA

## 2022-09-09 ENCOUNTER — Encounter: Payer: Self-pay | Admitting: *Deleted

## 2022-09-09 ENCOUNTER — Telehealth: Payer: Self-pay | Admitting: *Deleted

## 2022-09-09 NOTE — Patient Outreach (Signed)
  Care Coordination   Initial Visit Note   09/09/2022 Name: Roger [name] MRN: [id number] DOB: 15-Nov-1946  Roger [name] is a 76 y.o. year old male who sees [facility] for [facility]. I spoke with  Denny Peon by phone today.  What matters to the patients health and wellness today?  "to be able to afford 2 of my medications"    Goals Addressed               This Visit's Progress     COMPLETED: "to be able to afford 2 of my medications" (pt-stated)        Care Coordination Interventions: Patient interviewed about adult health maintenance status including  importance of getting yearly Annual Wellness Visit Provided education about taking medications as prescribed Reviewed importance of heart healthy diet Referral to pharmacist for medication assistance for Repatha and [place] coordination program explained to patient who is agreeable to today's outreach and would like pharmacist to outreach for being unable to afford some of his medications, pt reports he does get assistance from [place] but has been unable to get any assistance with Repatha and Nexlyzet. Patient reports he lives at [employer] at [place] in an apartment, transportation is provided, a Marine scientist in on site, pt reports he has assistance if needed          SDOH assessments and interventions completed:  Yes  SDOH Interventions Today    Flowsheet Row Most Recent Value  SDOH Interventions   Food Insecurity Interventions Intervention Not Indicated  Transportation Interventions Intervention Not Indicated        Care Coordination Interventions Activated:  Yes  Care Coordination Interventions:  Yes, provided   Follow up plan: Referral made to pharmacist for medication assistance    Encounter Outcome:  Pt. Visit Completed   Jacqlyn Larsen [facility], BSN [facility] RN Care Coordinator [phone]

## 2022-09-18 NOTE — Telephone Encounter (Signed)
Routed to Christine: [phone]

## 2022-10-15 ENCOUNTER — Telehealth: Payer: Self-pay

## 2022-10-15 NOTE — Progress Notes (Signed)
[facility])  [facility] Quality Pharmacy Team    10/15/2022  XYLAN SHEILS October 15, 1946 [id number]  Reason for referral: Medication Assistance  Referral source: [facility] RN Current insurance:  Medicare  Outreach:  Successful telephone call with patient on 09/21/22 and 10/15/22.  HIPAA identifiers verified.   Subjective:  Mr. Rowe was outreached several times to discuss medication assistance options for Repatha and Nexlizet. I was able to speak to Mr. Nichol on 10/16, but he did not have his income documents to complete the medication assistance screening, so he reported he would call back with that information.   On 10/02/22, I called Mr. Vivanco to follow up, but was unable to reach him; a HIPAA compliant voicemail was left. I was able to reach Mr. Zwahlen today and he reports he is able to afford Repatha and Nexlizet currently, through [employer] program. The patient declined further medication assistance workup at this time.  Plan: Will close [facility] pharmacy case as no further medication needs identified at this time.  Am happy to assist in the future as needed.    Thanks,  Harlon Flor, PharmD Clinical Pharmacist  [facility] [phone]

## 2023-02-22 ENCOUNTER — Ambulatory Visit: Payer: Medicare Other | Admitting: Internal Medicine

## 2023-02-22 ENCOUNTER — Encounter: Payer: Self-pay | Admitting: Internal Medicine

## 2023-02-22 VITALS — BP 124/78 | HR 68 | Ht 68.0 in | Wt 240.6 lb

## 2023-02-22 DIAGNOSIS — Z125 Encounter for screening for malignant neoplasm of prostate: Secondary | ICD-10-CM

## 2023-02-22 DIAGNOSIS — I4811 Longstanding persistent atrial fibrillation: Secondary | ICD-10-CM | POA: Insufficient documentation

## 2023-02-22 DIAGNOSIS — E782 Mixed hyperlipidemia: Secondary | ICD-10-CM | POA: Insufficient documentation

## 2023-02-22 DIAGNOSIS — K219 Gastro-esophageal reflux disease without esophagitis: Secondary | ICD-10-CM

## 2023-02-22 DIAGNOSIS — Z9861 Coronary angioplasty status: Secondary | ICD-10-CM

## 2023-02-22 DIAGNOSIS — E119 Type 2 diabetes mellitus without complications: Secondary | ICD-10-CM | POA: Insufficient documentation

## 2023-02-22 DIAGNOSIS — E785 Hyperlipidemia, unspecified: Secondary | ICD-10-CM | POA: Insufficient documentation

## 2023-02-22 DIAGNOSIS — I10 Essential (primary) hypertension: Secondary | ICD-10-CM | POA: Diagnosis not present

## 2023-02-22 DIAGNOSIS — N320 Bladder-neck obstruction: Secondary | ICD-10-CM

## 2023-02-22 DIAGNOSIS — E538 Deficiency of other specified B group vitamins: Secondary | ICD-10-CM

## 2023-02-22 DIAGNOSIS — I2510 Atherosclerotic heart disease of native coronary artery without angina pectoris: Secondary | ICD-10-CM

## 2023-02-22 DIAGNOSIS — I5032 Chronic diastolic (congestive) heart failure: Secondary | ICD-10-CM | POA: Insufficient documentation

## 2023-02-22 LAB — POCT CBG (FASTING - GLUCOSE)-MANUAL ENTRY: Glucose Fasting, POC: 155 mg/dL — AB (ref 70–99)

## 2023-02-22 NOTE — Progress Notes (Signed)
Established Patient Office Visit  Subjective:  Patient ID: Roger [name], male    DOB: September 05, 1946  Age: 77 y.o. MRN: [id number]  Chief Complaint  Patient presents with   Follow-up    3 month follow up    Patient comes in for his regular follow-up today.  He is feeling well and offers no new complaints.  He has however gained some weight since his last visit.  He understands he needs to cut out some calories including his sodas.  He admits that drinking sodas may interfere with his sleep at night.  He also plans to increase his physical activity now.  Patient does not have any complaints of chest pain or shortness of breath.  He is taking all his medications as prescribed. He will return fasting for his blood work.    No other concerns at this time.   Past Medical History:  Diagnosis Date   Anemia    Arthritis    Atrial fibrillation ([place])    Bladder stone    Dysrhythmia    GERD (gastroesophageal reflux disease)    Hypertension     Past Surgical History:  Procedure Laterality Date   CATARACT EXTRACTION     CORONARY STENT INTERVENTION N/A 04/20/2019   Procedure: CORONARY STENT INTERVENTION;  Surgeon: Yolonda Kida, MD;  Location: [facility];  Service: Cardiovascular;  Laterality: N/A;   CYSTOSCOPY WITH LITHOLAPAXY N/A 10/06/2019   Procedure: CYSTOSCOPY WITH LITHOLAPAXY;  Surgeon: Billey Co, MD;  Location: [facility];  Service: Urology;  Laterality: N/A;   HIP SURGERY     HOLEP-LASER ENUCLEATION OF THE PROSTATE WITH MORCELLATION N/A 10/06/2019   Procedure: HOLEP-LASER ENUCLEATION OF THE PROSTATE WITH MORCELLATION;  Surgeon: Billey Co, MD;  Location: [facility];  Service: Urology;  Laterality: N/A;   JOINT REPLACEMENT Right 2012   hip right   LEFT HEART CATH AND CORONARY ANGIOGRAPHY Left 04/20/2019   Procedure: LEFT HEART CATH AND CORONARY ANGIOGRAPHY;  Surgeon: Dionisio David, MD;  Location: [facility];  Service: Cardiovascular;  Laterality:  Left;    Social History   Socioeconomic History   Marital status: Single    Spouse name: Not on file   Number of children: Not on file   Years of education: Not on file   Highest education level: Not on file  Occupational History   Not on file  Tobacco Use   Smoking status: Every Day    Packs/day: .5    Types: Cigarettes   Smokeless tobacco: Never  Vaping Use   Vaping Use: Never used  Substance and Sexual Activity   Alcohol use: Yes    Comment: rare   Drug use: No   Sexual activity: Not on file  Other Topics Concern   Not on file  Social History Narrative   Not on file   Social Determinants of Health   Financial Resource Strain: Not on file  Food Insecurity: No Food Insecurity (09/09/2022)   Hunger Vital Sign    Worried About Running Out of Food in the Last Year: Never true    Ran Out of Food in the Last Year: Never true  Transportation Needs: No Transportation Needs (09/09/2022)   PRAPARE - Hydrologist (Medical): No    Lack of Transportation (Non-Medical): No  Physical Activity: Not on file  Stress: Not on file  Social Connections: Not on file  Intimate Partner Violence: Not on file  History reviewed. No pertinent family history.  Allergies  Allergen Reactions   Amoxicillin Anaphylaxis    Did it involve swelling of the face/tongue/throat, SOB, or low BP? Yes Did it involve sudden or severe rash/hives, skin peeling, or any reaction on the inside of your mouth or nose? No Did you need to seek medical attention at a hospital or doctor's office? No When did it last happen?      10+ years If all above answers are "NO", may proceed with cephalosporin use.    Sulfa Antibiotics Anaphylaxis   Sulfur Dioxide Anaphylaxis   Penicillin G Swelling   Ibuprofen     Stomachache   Pravastatin Other (See Comments)    Irritable bowel   Rosuvastatin Other (See Comments)    Irritable bowel    Review of Systems  Constitutional:  Negative  for chills, diaphoresis, fever, malaise/fatigue and weight loss.  HENT:  Negative for ear discharge, ear pain, hearing loss, nosebleeds, sinus pain and tinnitus.   Eyes: Negative.   Respiratory:  Negative for cough, sputum production, shortness of breath, wheezing and stridor.   Cardiovascular:  Negative for chest pain, palpitations, orthopnea, claudication, leg swelling and PND.  Gastrointestinal:  Negative for abdominal pain, blood in stool, constipation, diarrhea, heartburn, melena, nausea and vomiting.  Genitourinary:  Positive for urgency. Negative for dysuria, frequency and hematuria.  Musculoskeletal: Negative.   Neurological: Negative.   Psychiatric/Behavioral: Negative.         Objective:   BP 124/78   Pulse 68   Ht 5\' 8"  (1.727 m)   Wt 240 lb 9.6 oz (109.1 kg)   SpO2 95%   BMI 36.58 kg/m   Vitals:   02/22/23 1316  BP: 124/78  Pulse: 68  Height: 5\' 8"  (1.727 m)  Weight: 240 lb 9.6 oz (109.1 kg)  SpO2: 95%  BMI (Calculated): 36.59    Physical Exam Vitals and nursing note reviewed.  Constitutional:      Appearance: Normal appearance. He is obese.  HENT:     Head: Normocephalic.  Cardiovascular:     Rate and Rhythm: Normal rate. Rhythm irregular.     Pulses: Normal pulses.     Heart sounds: Normal heart sounds.  Pulmonary:     Effort: Pulmonary effort is normal.     Breath sounds: Normal breath sounds. No wheezing or rales.  Abdominal:     General: Abdomen is flat. Bowel sounds are normal.     Palpations: Abdomen is soft.  Musculoskeletal:        General: Normal range of motion.     Cervical back: Normal range of motion and neck supple.  Neurological:     General: No focal deficit present.     Mental Status: He is alert and oriented to person, place, and time.      Results for orders placed or performed in visit on 02/22/23  POCT CBG (Fasting - Glucose)  Result Value Ref Range   Glucose Fasting, POC 155 (A) 70 - 99 mg/dL    Recent Results (from  the past 2160 hour(s))  POCT CBG (Fasting - Glucose)     Status: Abnormal   Collection Time: 02/22/23  1:19 PM  Result Value Ref Range   Glucose Fasting, POC 155 (A) 70 - 99 mg/dL      Assessment & Plan:  Patient advised to continue all his medications as such.  He will return fasting for his blood work.  Patient also advised on reducing his caffeine intake so  it does not disturb his sleep at night.  He understands the need to reduce his weight. Problem List Items Addressed This Visit     Diabetes mellitus without complication ([place]) - Primary   Relevant Medications   enalapril (VASOTEC) 10 MG tablet   Other Relevant Orders   POCT CBG (Fasting - Glucose) (Completed)   Hemoglobin A1c   Gastroesophageal reflux disease without esophagitis   Relevant Orders   CBC With Differential   Mixed hyperlipidemia   Relevant Medications   enalapril (VASOTEC) 10 MG tablet   Other Relevant Orders   Lipid Panel w/o Chol/HDL Ratio   Chronic diastolic congestive heart failure (HCC)   Relevant Medications   enalapril (VASOTEC) 10 MG tablet   Longstanding persistent atrial fibrillation (HCC)   Relevant Medications   enalapril (VASOTEC) 10 MG tablet   Essential hypertension, benign   Relevant Medications   enalapril (VASOTEC) 10 MG tablet   Other Relevant Orders   CMP14+EGFR   Vitamin B12 deficiency   Relevant Orders   Vitamin B12   CAD S/P percutaneous coronary angioplasty   Relevant Medications   enalapril (VASOTEC) 10 MG tablet   Other Visit Diagnoses     Screening for prostate cancer       Relevant Orders   PSA   Bladder-neck obstruction       Relevant Orders   PSA       Return in about 3 months (around 05/25/2023).   Total time spent: 30 minutes  Perrin Maltese, MD  02/22/2023

## 2023-02-23 ENCOUNTER — Other Ambulatory Visit: Payer: Medicare Other

## 2023-02-24 LAB — CBC WITH DIFFERENTIAL
Basophils Absolute: 0.1 10*3/uL (ref 0.0–0.2)
Basos: 1 %
EOS (ABSOLUTE): 0.1 10*3/uL (ref 0.0–0.4)
Eos: 1 %
Hematocrit: 44.1 % (ref 37.5–51.0)
Hemoglobin: 14.5 g/dL (ref 13.0–17.7)
Immature Grans (Abs): 0 10*3/uL (ref 0.0–0.1)
Immature Granulocytes: 0 %
Lymphocytes Absolute: 0.9 10*3/uL (ref 0.7–3.1)
Lymphs: 11 %
MCH: 31.2 pg (ref 26.6–33.0)
MCHC: 32.9 g/dL (ref 31.5–35.7)
MCV: 95 fL (ref 79–97)
Monocytes Absolute: 0.5 10*3/uL (ref 0.1–0.9)
Monocytes: 7 %
Neutrophils Absolute: 6 10*3/uL (ref 1.4–7.0)
Neutrophils: 80 %
RBC: 4.65 x10E6/uL (ref 4.14–5.80)
RDW: 14 % (ref 11.6–15.4)
WBC: 7.6 10*3/uL (ref 3.4–10.8)

## 2023-02-24 LAB — LIPID PANEL W/O CHOL/HDL RATIO
Cholesterol, Total: 88 mg/dL — ABNORMAL LOW (ref 100–199)
HDL: 47 mg/dL (ref >39)
LDL Chol Calc (NIH): 22 mg/dL (ref 0–99)
Triglycerides: 101 mg/dL (ref 0–149)
VLDL Cholesterol Cal: 19 mg/dL (ref 5–40)

## 2023-02-24 LAB — CMP14+EGFR
ALT: 9 IU/L (ref 0–44)
AST: 15 IU/L (ref 0–40)
Albumin/Globulin Ratio: 1.7 (ref 1.2–2.2)
Albumin: 3.8 g/dL (ref 3.8–4.8)
Alkaline Phosphatase: 59 IU/L (ref 44–121)
BUN/Creatinine Ratio: 15 (ref 10–24)
BUN: 18 mg/dL (ref 8–27)
Bilirubin Total: 1 mg/dL (ref 0.0–1.2)
CO2: 22 mmol/L (ref 20–29)
Calcium: 9.1 mg/dL (ref 8.6–10.2)
Chloride: 102 mmol/L (ref 96–106)
Creatinine, Ser: 1.21 mg/dL (ref 0.76–1.27)
Globulin, Total: 2.3 g/dL (ref 1.5–4.5)
Glucose: 115 mg/dL — ABNORMAL HIGH (ref 70–99)
Potassium: 4.6 mmol/L (ref 3.5–5.2)
Sodium: 141 mmol/L (ref 134–144)
Total Protein: 6.1 g/dL (ref 6.0–8.5)
eGFR: 63 mL/min/{1.73_m2} (ref >59)

## 2023-02-24 LAB — HEMOGLOBIN A1C
Est. average glucose Bld gHb Est-mCnc: 146 mg/dL
Hgb A1c MFr Bld: 6.7 % — ABNORMAL HIGH (ref 4.8–5.6)

## 2023-02-24 LAB — PSA: Prostate Specific Ag, Serum: 3.9 ng/mL (ref 0.0–4.0)

## 2023-02-24 LAB — VITAMIN B12: Vitamin B-12: 265 pg/mL (ref 232–1245)

## 2023-03-07 ENCOUNTER — Emergency Department
Admission: EM | Admit: 2023-03-07 | Discharge: 2023-03-07 | Disposition: A | Discharge status: Home / Self Care | Payer: Medicare Other | Attending: Emergency Medicine | Admitting: Emergency Medicine

## 2023-03-07 DIAGNOSIS — Z7901 Long term (current) use of anticoagulants: Secondary | ICD-10-CM | POA: Insufficient documentation

## 2023-03-07 DIAGNOSIS — K6289 Other specified diseases of anus and rectum: Secondary | ICD-10-CM | POA: Insufficient documentation

## 2023-03-07 DIAGNOSIS — I2510 Atherosclerotic heart disease of native coronary artery without angina pectoris: Secondary | ICD-10-CM | POA: Diagnosis not present

## 2023-03-07 DIAGNOSIS — I509 Heart failure, unspecified: Secondary | ICD-10-CM | POA: Insufficient documentation

## 2023-03-07 DIAGNOSIS — I10 Essential (primary) hypertension: Secondary | ICD-10-CM | POA: Diagnosis not present

## 2023-03-07 DIAGNOSIS — K649 Unspecified hemorrhoids: Secondary | ICD-10-CM

## 2023-03-07 DIAGNOSIS — E119 Type 2 diabetes mellitus without complications: Secondary | ICD-10-CM | POA: Insufficient documentation

## 2023-03-07 MED ORDER — HYDROCORTISONE ACETATE 25 MG RE SUPP: 25.0000 mg | Freq: Two times a day (BID) | RECTAL | 0 refills | Status: DC

## 2023-03-07 NOTE — ED Triage Notes (Signed)
Pt sts that when he wipes his buttocks he has pain and when he doesn't wipe he has no pain.

## 2023-03-07 NOTE — ED Provider Notes (Signed)
[facility] Provider Note    Event Date/Time   First MD Initiated Contact with Patient 03/07/23 1206     (approximate)   History   Chief Complaint Hemorrhoids   HPI  Roger [name] is a 77 y.o. male with past medical history of hypertension, diabetes, atrial fibrillation on Eliquis, CAD, diastolic CHF, and ventricular fibrillation who presents to the ED complaining of hemorrhoids.  Patient reports that he has been dealing with intermittent rectal pain and bleeding for about the past 2 months, gradually increasing in severity.  He states he typically will have discomfort when he wipes, has been noticing this on a more frequent basis with occasional bleeding.  He has not noticed any blood in his stool and denies any dark tarry stool, states he only notices blood when he wipes and it typically clears up after a couple of wipes.  He has not taken any medication for his symptoms, reports being told he has hemorrhoids in the past.  He denies any chest pain, shortness of breath, lightheadedness, or dizziness.     Physical Exam   Triage Vital Signs: ED Triage Vitals [03/07/23 1201]  Enc Vitals Group     BP (!) 147/85     Pulse Rate 87     Resp 17     Temp 97.8 F (36.6 C)     Temp Source Oral     SpO2 95 %     Weight 235 lb (106.6 kg)     Height      Head Circumference      Peak Flow      Pain Score 0     Pain Loc      Pain Edu?      Excl. in [place]?     Most recent vital signs: Vitals:   03/07/23 1201  BP: (!) 147/85  Pulse: 87  Resp: 17  Temp: 97.8 F (36.6 C)  SpO2: 95%    Constitutional: Alert and oriented. Eyes: Conjunctivae are normal. Head: Atraumatic. Nose: No congestion/rhinnorhea. Mouth/Throat: Mucous membranes are moist.  Cardiovascular: Normal rate, regular rhythm. Grossly normal heart sounds.  2+ radial pulses bilaterally. Respiratory: Normal respiratory effort.  No retractions. Lungs CTAB. Gastrointestinal: Soft and nontender.  No distention.  Nonbleeding hemorrhoids noted. Musculoskeletal: No lower extremity tenderness nor edema.  Neurologic:  Normal speech and language. No gross focal neurologic deficits are appreciated.    ED Results / Procedures / Treatments   Labs (all labs ordered are listed, but only abnormal results are displayed) Labs Reviewed - No data to display   PROCEDURES:  Critical Care performed: No  Procedures   MEDICATIONS ORDERED IN ED: Medications - No data to display   IMPRESSION / MDM / [place] / ED COURSE  I reviewed the triage vital signs and the nursing notes.                              77 y.o. male with past medical history of hypertension, diabetes, CAD, CHF, atrial fibrillation on Eliquis, and ventricular fibrillation who presents to the ED complaining of intermittent rectal pain and bleeding for the past 2 months, now increasing in frequency.  Patient's presentation is most consistent with acute, uncomplicated illness.  Differential diagnosis includes, but is not limited to, internal hemorrhoids, external hemorrhoids, anal fissure, anemia.  Patient well-appearing and in no acute distress, vital signs are unremarkable.  He has a benign  abdominal exam, rectal exam with external hemorrhoids that are not actively bleeding and do not appear thrombosed.  Patient has had intermittent mild bleeding, no symptoms to suggest anemia and we will hold off on labs at this time.  He is appropriate for outpatient management and we will prescribe Anusol suppositories, patient also counseled on sitz bath's and over-the-counter topical medication.  He was counseled to follow-up with his PCP and to return to the ED for new or worsening symptoms, patient agrees with plan.      FINAL CLINICAL IMPRESSION(S) / ED DIAGNOSES   Final diagnoses:  Rectal pain  Hemorrhoids, unspecified hemorrhoid type     Rx / DC Orders   ED Discharge Orders          Ordered    hydrocortisone  (ANUSOL-HC) 25 MG suppository  2 times daily        03/07/23 1221             Note:  This document was prepared using Dragon voice recognition software and may include unintentional dictation errors.   Blake Divine, MD 03/07/23 1229

## 2023-03-11 ENCOUNTER — Telehealth: Payer: Self-pay | Admitting: *Deleted

## 2023-03-11 NOTE — Telephone Encounter (Signed)
     Patient  visit on 03/07/2023  at [facility] was for treatment   Have you been able to follow up with your primary care physician? Feeling better , and will follow up with if needed with pcp .Patient has transportation and medication  The patient was able to obtain any needed medicine or equipment.  Are there diet recommendations that you are having difficulty following?  Patient expresses understanding of discharge instructions and education provided has no other needs at this time.   [place] [phone] [address] , [place] [address] Email : [email or web address]

## 2023-03-18 ENCOUNTER — Encounter: Payer: Self-pay | Admitting: Cardiovascular Disease

## 2023-03-18 ENCOUNTER — Ambulatory Visit: Payer: Medicare Other | Admitting: Cardiovascular Disease

## 2023-03-18 VITALS — BP 126/66 | HR 81 | Ht 68.0 in | Wt 240.8 lb

## 2023-03-18 DIAGNOSIS — I2510 Atherosclerotic heart disease of native coronary artery without angina pectoris: Secondary | ICD-10-CM

## 2023-03-18 DIAGNOSIS — I5032 Chronic diastolic (congestive) heart failure: Secondary | ICD-10-CM | POA: Diagnosis not present

## 2023-03-18 DIAGNOSIS — I4811 Longstanding persistent atrial fibrillation: Secondary | ICD-10-CM

## 2023-03-18 DIAGNOSIS — I10 Essential (primary) hypertension: Secondary | ICD-10-CM | POA: Diagnosis not present

## 2023-03-18 DIAGNOSIS — Z9861 Coronary angioplasty status: Secondary | ICD-10-CM

## 2023-03-18 DIAGNOSIS — E782 Mixed hyperlipidemia: Secondary | ICD-10-CM

## 2023-03-18 NOTE — Progress Notes (Signed)
Cardiology Office Note   Date:  03/18/2023   ID:  Roger [name], DOB 06-17-1946, MRN [id number]  PCP:  Margaretann Loveless, MD  Cardiologist:  Adrian Blackwater, MD      History of Present Illness: Roger [name] is a 77 y.o. male who presents for  Chief Complaint  Patient presents with   Follow-up    4 month follow up    Patient in office for routine cardiac exam. Denies chest pain, shortness of breath, edema, palpitations.     Past Medical History:  Diagnosis Date   Anemia    Arthritis    Atrial fibrillation    Bladder stone    Dysrhythmia    GERD (gastroesophageal reflux disease)    Hypertension      Past Surgical History:  Procedure Laterality Date   CATARACT EXTRACTION     CORONARY STENT INTERVENTION N/A 04/20/2019   Procedure: CORONARY STENT INTERVENTION;  Surgeon: Alwyn Pea, MD;  Location: [facility];  Service: Cardiovascular;  Laterality: N/A;   CYSTOSCOPY WITH LITHOLAPAXY N/A 10/06/2019   Procedure: CYSTOSCOPY WITH LITHOLAPAXY;  Surgeon: Sondra Come, MD;  Location: [facility];  Service: Urology;  Laterality: N/A;   HIP SURGERY     HOLEP-LASER ENUCLEATION OF THE PROSTATE WITH MORCELLATION N/A 10/06/2019   Procedure: HOLEP-LASER ENUCLEATION OF THE PROSTATE WITH MORCELLATION;  Surgeon: Sondra Come, MD;  Location: [facility];  Service: Urology;  Laterality: N/A;   JOINT REPLACEMENT Right 2012   hip right   LEFT HEART CATH AND CORONARY ANGIOGRAPHY Left 04/20/2019   Procedure: LEFT HEART CATH AND CORONARY ANGIOGRAPHY;  Surgeon: Laurier Nancy, MD;  Location: [facility];  Service: Cardiovascular;  Laterality: Left;     Current Outpatient Medications  Medication Sig Dispense Refill   azelastine (ASTELIN) 0.1 % nasal spray Place into the nose.     Bempedoic Acid-Ezetimibe (NEXLIZET) 180-10 MG TABS Take 1 tablet by mouth daily at 6 (six) AM.     Cholecalciferol (VITAMIN D3) 1.25 MG (50000 UT) CAPS Take 50,000 Units by mouth every  Thursday.      Cholecalciferol 25 MCG (1000 UT) tablet Take 1 tablet by mouth daily.     ELIQUIS 2.5 MG TABS tablet Take 2.5 mg by mouth 2 (two) times daily.     enalapril (VASOTEC) 10 MG tablet TAKE ONE-HALF TABLET BY MOUTH EVERY DAY HIGH BLOOD PRESSURE     ergocalciferol (VITAMIN D2) 1.25 MG (50000 UT) capsule Take 50,000 Units by mouth once a week. Thursday     FARXIGA 10 MG TABS tablet Take 10 mg by mouth daily.     hydrocortisone (ANUSOL-HC) 25 MG suppository Place 1 suppository (25 mg total) rectally 2 (two) times daily. 12 suppository 0   ketoconazole (NIZORAL) 2 % shampoo Apply topically.     ketotifen (ZADITOR) 0.025 % ophthalmic solution Place 1 drop into both eyes daily as needed (allergies).     metoprolol tartrate (LOPRESSOR) 25 MG tablet Take 25 mg by mouth 2 (two) times daily.     mupirocin ointment (BACTROBAN) 2 % Apply topically.     pantoprazole (PROTONIX) 40 MG tablet Take 1 tablet (40 mg total) by mouth daily. (Patient taking differently: Take 40 mg by mouth daily as needed (acid reflux).) 30 tablet 0   REPATHA SURECLICK 140 MG/ML SOAJ SMARTSIG:1 Injection SUB-Q Every 2 Weeks     spironolactone (ALDACTONE) 25 MG tablet Take 25 mg by mouth daily.  No current facility-administered medications for this visit.    Allergies:   Amoxicillin, Sulfa antibiotics, Sulfur dioxide, Penicillin g, Ibuprofen, Pravastatin, and Rosuvastatin    Social History:   reports that he has been smoking cigarettes. He has been smoking an average of .5 packs per day. He has never used smokeless tobacco. He reports current alcohol use. He reports that he does not use drugs.   Family History:  family history is not on file.    ROS:     Review of Systems  Constitutional: Negative.   HENT: Negative.    Eyes: Negative.   Respiratory: Negative.    Cardiovascular: Negative.   Gastrointestinal: Negative.   Genitourinary: Negative.   Musculoskeletal: Negative.   Skin: Negative.   Neurological:  Negative.   Endo/Heme/Allergies: Negative.   Psychiatric/Behavioral: Negative.    All other systems reviewed and are negative.   All other systems are reviewed and negative.   PHYSICAL EXAM: VS:  BP 126/66   Pulse 81   Ht 5\' 8"  (1.727 m)   Wt 240 lb 12.8 oz (109.2 kg)   SpO2 96%   BMI 36.61 kg/m  , BMI Body mass index is 36.61 kg/m. Last weight:  Wt Readings from Last 3 Encounters:  03/18/23 240 lb 12.8 oz (109.2 kg)  03/07/23 235 lb (106.6 kg)  02/22/23 240 lb 9.6 oz (109.1 kg)   Physical Exam Vitals reviewed.  Constitutional:      Appearance: Normal appearance. He is normal weight.  HENT:     Head: Normocephalic.     Nose: Nose normal.     Mouth/Throat:     Mouth: Mucous membranes are moist.  Eyes:     Pupils: Pupils are equal, round, and reactive to light.  Cardiovascular:     Rate and Rhythm: Normal rate and regular rhythm.     Pulses: Normal pulses.     Heart sounds: Normal heart sounds.  Pulmonary:     Effort: Pulmonary effort is normal.  Abdominal:     General: Abdomen is flat. Bowel sounds are normal.  Musculoskeletal:        General: Normal range of motion.     Cervical back: Normal range of motion.  Skin:    General: Skin is warm.  Neurological:     General: No focal deficit present.     Mental Status: He is alert.  Psychiatric:        Mood and Affect: Mood normal.     EKG: none today  Recent Labs: 02/23/2023: ALT 9; BUN 18; Creatinine, Ser 1.21; Hemoglobin 14.5; Potassium 4.6; Sodium 141    Lipid Panel    Component Value Date/Time   CHOL 88 (L) 02/23/2023 1016   TRIG 101 02/23/2023 1016   HDL 47 02/23/2023 1016   LDLCALC 22 02/23/2023 1016      Other studies Reviewed: Patient: [id number] - Roger [name] DOB:  04/22/1946   Date:  10/20/2021 09:00 Provider: Adrian Blackwater MD Encounter: ECHO   Page 2 REASON FOR VISIT  Visit for: Echocardiogram/Hypertension  Sex:   Male         wt=  228  lbs.  BP=118/64  Height= 68   inches.     TESTS  Imaging: Echocardiogram:  An echocardiogram in (2-d) mode was performed and in Doppler mode with color flow velocity mapping was performed. The aortic valve cusps are abnormal 2.2  cm, flow velocity 1.01   m/s, and systolic calculated mean flow gradient 2   mmHg.  Mitral valve diastolic peak flow velocity E 8.42   m/s. Aortic root diameter 3.5  cm. The LVOT internal diameter 3.0  cm and flow velocity was abnormal .564  m/s. LV systolic dimension 4.08  cm, diastolic 5.37   cm, posterior wall thickness 1.33    cm, fractional shortening 24  %, and EF 47.6  %. IVS thickness 1.19   cm. LA dimension 5.1  cm. Mitral Valve has Trace Regurgitation. Pulmonic Valve has Trace Regurgitation. Tricuspid Valve has Trace Regurgitation.     ASSESSMENT  Technically difficult study due to body habitus.  Normal left ventricular systolic function.  Mild left ventricular hypertrophy with GRADE 3 ( restrictive physiology) diastolic dysfunction.  Normal right ventricular systolic function.  Normal right ventricular diastolic function..  Normal left ventricular wall motion.  Normal right ventricular wall motion.  Trace pulmonary regurgitation.  Trace tricuspid regurgitation.  Moderate pulmonary hypertension.  Trace mitral regurgitation.  No pericardial effusion.  Severely dilated Left atrium  Moderately dilated Right atrium  Normal Ao root and ascending Ao  Global hypokinesis  Mild LV dysfunction   Mild LVH.   THERAPY   Referring physician: Laurier Nancy  Sonographer: Adrian Blackwater.   Adrian Blackwater MD  Electronically signed by: Adrian Blackwater     Date: 10/21/2021 14:34  Patient: [id number] - Roger [name] DOB:  [birth year]   Date:  01/10/2021 09:30 Provider: Adrian Blackwater MD Encounter: ALL ANGIOGRAMS (CTA BRAIN, CAROTIDS, RENAL ARTERIES, PE)   Page 1 REASON FOR VISIT  Referred by Dr.Amyia Lodwick [place] Flakes.   TESTS  Imaging: Computed Tomographic Angiography:  Cardiac multidetector CT was  performed paying particular attention to the coronary arteries for the diagnosis of: CAD. Diagnostic Drugs:  Administered iohexol (Omnipaque) through an antecubital vein and images from the examination were analyzed for the presence and extent of coronary artery disease, using 3D image processing software. 100 mL of non-ionic contrast (Omnipaque) was used.   ASSESSMENT   The left main artery was abnormal:1  The proximal LAD artery are abnormal:1  The mid-LAD artery was abnormal:2  The distal LAD artery was abnormal:1  The proximal circumflex artery was abnormal:3  The distal circumflex artery was abnormal:1  The first obtuse marginal branch artery (OM-1) was abnormal:1  The proximal right coronary artery (RCA) was abnormal:1  The mid right coronary artery (RCA) was abnormal:1  The distal right coronary artery (RCA) was abnormal:3  The posterior descending coronary arteries were abnormal:1   TEST CONCLUSIONS  Quality of study: Fair  1- Calcium score: 2894.1  2-Co-dominant system.  3-Mid RCA has moderate disease. Proximal LCX has moderate to severe disease. Diffuse mild to moderate disease in the LAD. Consider CATH if having Chest Pain.   Adrian Blackwater MD  Electronically signed by: Adrian Blackwater     Date: 01/13/2021 14:45  Patient: [id number] - Roger [name] DOB:  16-Mar-1946    Date:  01/01/2021 07:30 Provider: Adrian Blackwater MD Encounter: NUCLEAR STRESS TEST   Page 1 TESTS    [facility] [address] [place], [place] [address] [phone] STUDY:  Gated Stress / Rest Myocardial Perfusion Imaging Tomographic (SPECT) Including attenuation correction Wall Motion, Left Ventricular Ejection Fraction By Gated Technique.Persantine Stress Test. SEX:  Male      WEIGHT: 220 lbs   HEIGHT: 68 in                  ARMS UP: YES/NO  REFERRING PHYSICIAN: Dr.Pascuala Klutts [place] Flakes                                                                                                                                                                                                                       INDICATION FOR STUDY: SOB                                                                                                                                                                                                                    TECHNIQUE:  Approximately 20 minutes following the intravenous administration of 10.0 mCi of Tc-24m Sestamibi after stress testing in a reclined supine position with arms above their head if able to do so, gated SPECT imaging of the heart was performed. After about a 2hr break, the patient was injected intravenously with 30.1 mCi of Tc-53m Sestamibi.  Approximately 45 minutes later in the same position as stress imaging SPECT rest imaging of the heart was performed.  STRESS BY:  Adrian Blackwater, MD PROTOCOL:  Persantine   DOSE ADMIN: 10 cc     ROUTE OF ADMINISTRATION: IV  MAX PRED HR: 148                     85%: 126               75%: 111                                                                                                                   RESTING BP: 122/62    RESTING HR: 67  PEAK BP: 98/60  PEAK HR: 81                                                                    EXERCISE DURATION:    4 min injection                                            REASON FOR TEST TERMINATION:    Protocol end                                                                                                                              SYMPTOMS:   None  EKG RESULTS:  A-Fib. 71/min. LBBB. Occasional PVC's. No significant ST changes with persantine.                                                            IMAGE QUALITY: Good                                                                                                                                                                                                                                                                                                                                  PERFUSION/WALL MOTION FINDINGS: EF = 73%. Moderate size and intensity fixed apical inferior and apical lateral wall defects, normal wall motion.                                                                          IMPRESSION: Equivocal stress test with normal LVEF. Advise CCTA  Adrian Blackwater, MD Stress Interpreting Physician / Nuclear Interpreting Physician  Adrian Blackwater MD  Electronically signed by: Adrian Blackwater     Date: 01/09/2021 12:18   ASSESSMENT AND PLAN:    ICD-10-CM   1. Chronic diastolic congestive heart failure  I50.32 PCV ECHOCARDIOGRAM COMPLETE    2. CAD S/P percutaneous coronary angioplasty  I25.10    Z98.61     3. Essential hypertension, benign  I10 PCV ECHOCARDIOGRAM COMPLETE    4. Longstanding persistent atrial fibrillation  I48.11     5. Mixed hyperlipidemia  E78.2        Problem List Items Addressed This Visit       Cardiovascular and Mediastinum   Chronic diastolic congestive heart failure - Primary    Patient doing well. Denies chest pain, shortness of breath. Last echo 10/2021 EF 47%. Will repeat echo prior to next appointment.       Relevant Orders   PCV ECHOCARDIOGRAM COMPLETE    Longstanding persistent atrial fibrillation   Essential hypertension, benign   Relevant Orders   PCV ECHOCARDIOGRAM COMPLETE   CAD S/P percutaneous coronary angioplasty     Other   Mixed hyperlipidemia     Disposition:   Return for with echo prior.    Total time spent: 30 minutes  Signed,  Adrian Blackwater, MD  03/18/2023 11:17 AM    [facility]

## 2023-03-18 NOTE — Assessment & Plan Note (Signed)
Patient doing well. Denies chest pain, shortness of breath. Last echo 10/2021 EF 47%. Will repeat echo prior to next appointment.

## 2023-03-26 ENCOUNTER — Other Ambulatory Visit: Payer: Self-pay | Admitting: Internal Medicine

## 2023-05-25 ENCOUNTER — Ambulatory Visit: Payer: Medicare Other | Admitting: Internal Medicine

## 2023-05-25 ENCOUNTER — Encounter: Payer: Self-pay | Admitting: Internal Medicine

## 2023-05-25 VITALS — BP 110/70 | HR 86 | Ht 68.0 in | Wt 238.2 lb

## 2023-05-25 DIAGNOSIS — I10 Essential (primary) hypertension: Secondary | ICD-10-CM

## 2023-05-25 DIAGNOSIS — E119 Type 2 diabetes mellitus without complications: Secondary | ICD-10-CM | POA: Diagnosis not present

## 2023-05-25 DIAGNOSIS — E782 Mixed hyperlipidemia: Secondary | ICD-10-CM | POA: Diagnosis not present

## 2023-05-25 DIAGNOSIS — I4811 Longstanding persistent atrial fibrillation: Secondary | ICD-10-CM

## 2023-05-25 DIAGNOSIS — E538 Deficiency of other specified B group vitamins: Secondary | ICD-10-CM | POA: Diagnosis not present

## 2023-05-25 NOTE — Progress Notes (Signed)
Established Patient Office Visit  Subjective:  Patient ID: Roger [name], male    DOB: [birth year]  Age: 77 y.o. MRN: [id number]  Chief Complaint  Patient presents with   Follow-up    3 month follow up    Patient in office for 3 month follow up. Needs labs. Not sure if he is taking [place] or [place]- as gets his meds from [place]. Last Hgba1c was high- will repeat today- Patient advised to bring in his bottles. Wil return in 1 week to discuss labs.     No other concerns at this time.   Past Medical History:  Diagnosis Date   Anemia    Arthritis    Atrial fibrillation (HCC)    Bladder stone    Dysrhythmia    GERD (gastroesophageal reflux disease)    Hypertension     Past Surgical History:  Procedure Laterality Date   CATARACT EXTRACTION     CORONARY STENT INTERVENTION N/A 04/20/2019   Procedure: CORONARY STENT INTERVENTION;  Surgeon: Alwyn Pea, MD;  Location: [facility];  Service: Cardiovascular;  Laterality: N/A;   CYSTOSCOPY WITH LITHOLAPAXY N/A 10/06/2019   Procedure: CYSTOSCOPY WITH LITHOLAPAXY;  Surgeon: Sondra Come, MD;  Location: [facility];  Service: Urology;  Laterality: N/A;   HIP SURGERY     HOLEP-LASER ENUCLEATION OF THE PROSTATE WITH MORCELLATION N/A 10/06/2019   Procedure: HOLEP-LASER ENUCLEATION OF THE PROSTATE WITH MORCELLATION;  Surgeon: Sondra Come, MD;  Location: [facility];  Service: Urology;  Laterality: N/A;   JOINT REPLACEMENT Right 2012   hip right   LEFT HEART CATH AND CORONARY ANGIOGRAPHY Left 04/20/2019   Procedure: LEFT HEART CATH AND CORONARY ANGIOGRAPHY;  Surgeon: Laurier Nancy, MD;  Location: [facility];  Service: Cardiovascular;  Laterality: Left;    Social History   Socioeconomic History   Marital status: Single    Spouse name: Not on file   Number of children: Not on file   Years of education: Not on file   Highest education level: Not on file  Occupational History   Not on file  Tobacco Use    Smoking status: Every Day    Packs/day: .5    Types: Cigarettes   Smokeless tobacco: Never  Vaping Use   Vaping Use: Never used  Substance and Sexual Activity   Alcohol use: Yes    Comment: rare   Drug use: No   Sexual activity: Not on file  Other Topics Concern   Not on file  Social History Narrative   Not on file   Social Determinants of Health   Financial Resource Strain: Not on file  Food Insecurity: No Food Insecurity (09/09/2022)   Hunger Vital Sign    Worried About Running Out of Food in the Last Year: Never true    Ran Out of Food in the Last Year: Never true  Transportation Needs: No Transportation Needs (09/09/2022)   PRAPARE - Administrator, Civil Service (Medical): No    Lack of Transportation (Non-Medical): No  Physical Activity: Not on file  Stress: Not on file  Social Connections: Not on file  Intimate Partner Violence: Not on file    History reviewed. No pertinent family history.  Allergies  Allergen Reactions   Amoxicillin Anaphylaxis    Did it involve swelling of the face/tongue/throat, SOB, or low BP? Yes Did it involve sudden or severe rash/hives, skin peeling, or any reaction on the inside of your mouth  or nose? No Did you need to seek medical attention at a hospital or doctor's office? No When did it last happen?      10+ years If all above answers are "NO", may proceed with cephalosporin use.    Sulfa Antibiotics Anaphylaxis   Sulfur Dioxide Anaphylaxis   Penicillin G Swelling   Ibuprofen     Stomachache   Pravastatin Other (See Comments)    Irritable bowel   Rosuvastatin Other (See Comments)    Irritable bowel    Review of Systems  Constitutional: Negative.   HENT: Negative.    Eyes: Negative.   Respiratory:  Negative for shortness of breath.   Cardiovascular: Negative.  Negative for chest pain and palpitations.  Gastrointestinal: Negative.  Negative for blood in stool, constipation, nausea and vomiting.   Genitourinary: Negative.   Musculoskeletal: Negative.   Skin: Negative.   Neurological: Negative.  Negative for dizziness.  Endo/Heme/Allergies: Negative.   Psychiatric/Behavioral: Negative.         Objective:   BP 110/70   Pulse 86   Ht 5\' 8"  (1.727 m)   Wt 238 lb 3.2 oz (108 kg)   SpO2 97%   BMI 36.22 kg/m   Vitals:   05/25/23 1042  BP: 110/70  Pulse: 86  Height: 5\' 8"  (1.727 m)  Weight: 238 lb 3.2 oz (108 kg)  SpO2: 97%  BMI (Calculated): 36.23    Physical Exam Vitals and nursing note reviewed.  Constitutional:      Appearance: Normal appearance. He is obese.  HENT:     Head: Normocephalic and atraumatic.     Nose: Nose normal.  Cardiovascular:     Rate and Rhythm: Normal rate. Rhythm irregular.     Heart sounds: Normal heart sounds.  Pulmonary:     Effort: Pulmonary effort is normal.     Breath sounds: Normal breath sounds.  Musculoskeletal:        General: Normal range of motion.     Cervical back: Normal range of motion.  Skin:    General: Skin is warm and dry.  Neurological:     General: No focal deficit present.     Mental Status: He is alert.  Psychiatric:        Mood and Affect: Mood normal.        Behavior: Behavior normal.      No results found for any visits on 05/25/23.  No results found for this or any previous visit (from the past 2160 hour(s)).    Assessment & Plan:   Problem List Items Addressed This Visit     Diabetes mellitus without complication (HCC) - Primary   Relevant Orders   Hemoglobin A1c   Mixed hyperlipidemia   Relevant Orders   Lipid Panel w/o Chol/HDL Ratio   Longstanding persistent atrial fibrillation (HCC)   Relevant Orders   CBC With Differential   Essential hypertension, benign   Relevant Orders   CMP14+EGFR   Vitamin B12 deficiency   Relevant Orders   Vitamin B12   Follow up 1 week.  Total time spent: 30 minutes  Margaretann Loveless, MD  05/25/2023   This document may have been prepared by  [facility] Voice Recognition software and as such may include unintentional dictation errors.

## 2023-05-26 LAB — VITAMIN B12: Vitamin B-12: 299 pg/mL (ref 232–1245)

## 2023-05-26 LAB — CBC WITH DIFFERENTIAL
Basophils Absolute: 0.1 10*3/uL (ref 0.0–0.2)
Basos: 1 %
EOS (ABSOLUTE): 0.1 10*3/uL (ref 0.0–0.4)
Eos: 1 %
Hematocrit: 42.3 % (ref 37.5–51.0)
Hemoglobin: 14.1 g/dL (ref 13.0–17.7)
Immature Grans (Abs): 0.1 10*3/uL (ref 0.0–0.1)
Immature Granulocytes: 1 %
Lymphocytes Absolute: 1.3 10*3/uL (ref 0.7–3.1)
Lymphs: 15 %
MCH: 30.8 pg (ref 26.6–33.0)
MCHC: 33.3 g/dL (ref 31.5–35.7)
MCV: 92 fL (ref 79–97)
Monocytes Absolute: 0.6 10*3/uL (ref 0.1–0.9)
Monocytes: 8 %
Neutrophils Absolute: 6.4 10*3/uL (ref 1.4–7.0)
Neutrophils: 74 %
RBC: 4.58 x10E6/uL (ref 4.14–5.80)
RDW: 13.2 % (ref 11.6–15.4)
WBC: 8.6 10*3/uL (ref 3.4–10.8)

## 2023-05-26 LAB — CMP14+EGFR
ALT: 7 IU/L (ref 0–44)
AST: 14 IU/L (ref 0–40)
Albumin: 4.1 g/dL (ref 3.8–4.8)
Alkaline Phosphatase: 64 IU/L (ref 44–121)
BUN/Creatinine Ratio: 16 (ref 10–24)
BUN: 19 mg/dL (ref 8–27)
Bilirubin Total: 0.8 mg/dL (ref 0.0–1.2)
CO2: 27 mmol/L (ref 20–29)
Calcium: 9.1 mg/dL (ref 8.6–10.2)
Chloride: 106 mmol/L (ref 96–106)
Creatinine, Ser: 1.21 mg/dL (ref 0.76–1.27)
Globulin, Total: 2.2 g/dL (ref 1.5–4.5)
Glucose: 104 mg/dL — ABNORMAL HIGH (ref 70–99)
Potassium: 5.1 mmol/L (ref 3.5–5.2)
Sodium: 144 mmol/L (ref 134–144)
Total Protein: 6.3 g/dL (ref 6.0–8.5)
eGFR: 63 mL/min/{1.73_m2} (ref >59)

## 2023-05-26 LAB — LIPID PANEL W/O CHOL/HDL RATIO
Cholesterol, Total: 93 mg/dL — ABNORMAL LOW (ref 100–199)
HDL: 51 mg/dL (ref >39)
LDL Chol Calc (NIH): 23 mg/dL (ref 0–99)
Triglycerides: 100 mg/dL (ref 0–149)
VLDL Cholesterol Cal: 19 mg/dL (ref 5–40)

## 2023-05-26 LAB — HEMOGLOBIN A1C
Est. average glucose Bld gHb Est-mCnc: 151 mg/dL
Hgb A1c MFr Bld: 6.9 % — ABNORMAL HIGH (ref 4.8–5.6)

## 2023-06-01 ENCOUNTER — Ambulatory Visit: Payer: Medicare Other | Admitting: Internal Medicine

## 2023-06-01 ENCOUNTER — Encounter: Payer: Self-pay | Admitting: Internal Medicine

## 2023-06-01 ENCOUNTER — Other Ambulatory Visit: Payer: Self-pay | Admitting: Internal Medicine

## 2023-06-01 VITALS — BP 122/78 | HR 82 | Ht 68.0 in | Wt 238.2 lb

## 2023-06-01 DIAGNOSIS — I10 Essential (primary) hypertension: Secondary | ICD-10-CM | POA: Diagnosis not present

## 2023-06-01 DIAGNOSIS — E1165 Type 2 diabetes mellitus with hyperglycemia: Secondary | ICD-10-CM | POA: Diagnosis not present

## 2023-06-01 DIAGNOSIS — E538 Deficiency of other specified B group vitamins: Secondary | ICD-10-CM

## 2023-06-01 DIAGNOSIS — E782 Mixed hyperlipidemia: Secondary | ICD-10-CM

## 2023-06-01 DIAGNOSIS — I2510 Atherosclerotic heart disease of native coronary artery without angina pectoris: Secondary | ICD-10-CM

## 2023-06-01 DIAGNOSIS — I5032 Chronic diastolic (congestive) heart failure: Secondary | ICD-10-CM

## 2023-06-01 DIAGNOSIS — Z9861 Coronary angioplasty status: Secondary | ICD-10-CM

## 2023-06-01 MED ORDER — CYANOCOBALAMIN 1000 MCG/ML IJ SOLN: 1000.0000 ug | Freq: Once | INTRAMUSCULAR | Status: AC

## 2023-06-01 NOTE — Progress Notes (Signed)
Established Patient Office Visit  Subjective:  Patient ID: Roger [name], male    DOB: 1946-01-12  Age: 77 y.o. MRN: [id number]  Chief Complaint  Patient presents with   Follow-up    1 week follow up    Patient in office for 1 week follow up, discuss recent lab results. Patient brought medications today for reconciliation. Taking Jardiance from [place].  No new complaints today.  Vitamin B12 injection today. Recommend starting a low dose vitamin B complex daily.  Hgb A1 c increased from previous labs. Continue all medications. Work on diet and exercise.     No other concerns at this time.   Past Medical History:  Diagnosis Date   Anemia    Arthritis    Atrial fibrillation (HCC)    Bladder stone    Dysrhythmia    GERD (gastroesophageal reflux disease)    Hypertension     Past Surgical History:  Procedure Laterality Date   CATARACT EXTRACTION     CORONARY STENT INTERVENTION N/A 04/20/2019   Procedure: CORONARY STENT INTERVENTION;  Surgeon: Alwyn Pea, MD;  Location: [facility];  Service: Cardiovascular;  Laterality: N/A;   CYSTOSCOPY WITH LITHOLAPAXY N/A 10/06/2019   Procedure: CYSTOSCOPY WITH LITHOLAPAXY;  Surgeon: Sondra Come, MD;  Location: [facility];  Service: Urology;  Laterality: N/A;   HIP SURGERY     HOLEP-LASER ENUCLEATION OF THE PROSTATE WITH MORCELLATION N/A 10/06/2019   Procedure: HOLEP-LASER ENUCLEATION OF THE PROSTATE WITH MORCELLATION;  Surgeon: Sondra Come, MD;  Location: [facility];  Service: Urology;  Laterality: N/A;   JOINT REPLACEMENT Right 2012   hip right   LEFT HEART CATH AND CORONARY ANGIOGRAPHY Left 04/20/2019   Procedure: LEFT HEART CATH AND CORONARY ANGIOGRAPHY;  Surgeon: Laurier Nancy, MD;  Location: [facility];  Service: Cardiovascular;  Laterality: Left;    Social History   Socioeconomic History   Marital status: Single    Spouse name: Not on file   Number of children: Not on file   Years of  education: Not on file   Highest education level: Not on file  Occupational History   Not on file  Tobacco Use   Smoking status: Every Day    Packs/day: .5    Types: Cigarettes   Smokeless tobacco: Never  Vaping Use   Vaping Use: Never used  Substance and Sexual Activity   Alcohol use: Yes    Comment: rare   Drug use: No   Sexual activity: Not on file  Other Topics Concern   Not on file  Social History Narrative   Not on file   Social Determinants of Health   Financial Resource Strain: Not on file  Food Insecurity: No Food Insecurity (09/09/2022)   Hunger Vital Sign    Worried About Running Out of Food in the Last Year: Never true    Ran Out of Food in the Last Year: Never true  Transportation Needs: No Transportation Needs (09/09/2022)   PRAPARE - Administrator, Civil Service (Medical): No    Lack of Transportation (Non-Medical): No  Physical Activity: Not on file  Stress: Not on file  Social Connections: Not on file  Intimate Partner Violence: Not on file    History reviewed. No pertinent family history.  Allergies  Allergen Reactions   Amoxicillin Anaphylaxis    Did it involve swelling of the face/tongue/throat, SOB, or low BP? Yes Did it involve sudden or severe rash/hives, skin  peeling, or any reaction on the inside of your mouth or nose? No Did you need to seek medical attention at a hospital or doctor's office? No When did it last happen?      10+ years If all above answers are "NO", may proceed with cephalosporin use.    Sulfa Antibiotics Anaphylaxis   Sulfur Dioxide Anaphylaxis   Penicillin G Swelling   Ibuprofen     Stomachache   Pravastatin Other (See Comments)    Irritable bowel   Rosuvastatin Other (See Comments)    Irritable bowel    Review of Systems  Constitutional: Negative.   HENT: Negative.    Eyes: Negative.   Respiratory: Negative.  Negative for cough and shortness of breath.   Cardiovascular: Negative.  Negative for  chest pain, palpitations and leg swelling.  Gastrointestinal: Negative.  Negative for abdominal pain, constipation, diarrhea, heartburn, nausea and vomiting.  Genitourinary: Negative.  Negative for dysuria and flank pain.  Musculoskeletal: Negative.  Negative for joint pain and myalgias.  Skin: Negative.   Neurological: Negative.  Negative for dizziness and headaches.  Endo/Heme/Allergies: Negative.   Psychiatric/Behavioral: Negative.  Negative for depression and suicidal ideas. The patient is not nervous/anxious.        Objective:   BP 122/78   Pulse 82   Ht 5\' 8"  (1.727 m)   Wt 238 lb 3.2 oz (108 kg)   SpO2 96%   BMI 36.22 kg/m   Vitals:   06/01/23 1042  BP: 122/78  Pulse: 82  Height: 5\' 8"  (1.727 m)  Weight: 238 lb 3.2 oz (108 kg)  SpO2: 96%  BMI (Calculated): 36.23    Physical Exam Vitals and nursing note reviewed.  Constitutional:      Appearance: Normal appearance. He is obese.  HENT:     Head: Normocephalic and atraumatic.     Nose: Nose normal.  Cardiovascular:     Rate and Rhythm: Normal rate and regular rhythm.     Pulses: Normal pulses.     Heart sounds: Normal heart sounds.  Pulmonary:     Effort: Pulmonary effort is normal.     Breath sounds: Normal breath sounds.  Abdominal:     General: Bowel sounds are normal.  Musculoskeletal:        General: Normal range of motion.     Cervical back: Normal range of motion.  Skin:    General: Skin is warm and dry.  Neurological:     General: No focal deficit present.     Mental Status: He is alert and oriented to person, place, and time.  Psychiatric:        Mood and Affect: Mood normal.        Behavior: Behavior normal.      No results found for any visits on 06/01/23.  Recent Results (from the past 2160 hour(s))  CBC With Differential     Status: None   Collection Time: 05/25/23 11:40 AM  Result Value Ref Range   WBC 8.6 3.4 - 10.8 x10E3/uL   RBC 4.58 4.14 - 5.80 x10E6/uL   Hemoglobin 14.1  13.0 - 17.7 g/dL   Hematocrit 40.9 81.1 - 51.0 %   MCV 92 79 - 97 fL   MCH 30.8 26.6 - 33.0 pg   MCHC 33.3 31.5 - 35.7 g/dL   RDW 91.4 78.2 - 95.6 %   Neutrophils 74 Not Estab. %   Lymphs 15 Not Estab. %   Monocytes 8 Not Estab. %   Eos  1 Not Estab. %   Basos 1 Not Estab. %   Neutrophils Absolute 6.4 1.4 - 7.0 x10E3/uL   Lymphocytes Absolute 1.3 0.7 - 3.1 x10E3/uL   Monocytes Absolute 0.6 0.1 - 0.9 x10E3/uL   EOS (ABSOLUTE) 0.1 0.0 - 0.4 x10E3/uL   Basophils Absolute 0.1 0.0 - 0.2 x10E3/uL   Immature Granulocytes 1 Not Estab. %   Immature Grans (Abs) 0.1 0.0 - 0.1 x10E3/uL    Comment: **Effective July 05, 2023, profile [id number] CBC/Differential**   (No Platelet) will be made non-orderable. [facility] Offers:   [id number] CBC With Differential/Platelet   CMP14+EGFR     Status: Abnormal   Collection Time: 05/25/23 11:40 AM  Result Value Ref Range   Glucose 104 (H) 70 - 99 mg/dL   BUN 19 8 - 27 mg/dL   Creatinine, Ser 9.14 0.76 - 1.27 mg/dL   eGFR 63 >78 GN/FAO/1.30   BUN/Creatinine Ratio 16 10 - 24   Sodium 144 134 - 144 mmol/L   Potassium 5.1 3.5 - 5.2 mmol/L   Chloride 106 96 - 106 mmol/L   CO2 27 20 - 29 mmol/L   Calcium 9.1 8.6 - 10.2 mg/dL   Total Protein 6.3 6.0 - 8.5 g/dL   Albumin 4.1 3.8 - 4.8 g/dL   Globulin, Total 2.2 1.5 - 4.5 g/dL   Bilirubin Total 0.8 0.0 - 1.2 mg/dL   Alkaline Phosphatase 64 44 - 121 IU/L   AST 14 0 - 40 IU/L   ALT 7 0 - 44 IU/L  Lipid Panel w/o Chol/HDL Ratio     Status: Abnormal   Collection Time: 05/25/23 11:40 AM  Result Value Ref Range   Cholesterol, Total 93 (L) 100 - 199 mg/dL   Triglycerides 865 0 - 149 mg/dL   HDL 51 >78 mg/dL   VLDL Cholesterol Cal 19 5 - 40 mg/dL   LDL Chol Calc (NIH) 23 0 - 99 mg/dL  Hemoglobin I6N     Status: Abnormal   Collection Time: 05/25/23 11:40 AM  Result Value Ref Range   Hgb A1c MFr Bld 6.9 (H) 4.8 - 5.6 %    Comment:          Prediabetes: 5.7 - 6.4          Diabetes: >6.4          Glycemic control for  adults with diabetes: <7.0    Est. average glucose Bld gHb Est-mCnc 151 mg/dL  Vitamin G29     Status: None   Collection Time: 05/25/23 11:40 AM  Result Value Ref Range   Vitamin B-12 299 232 - 1,245 pg/mL      Assessment & Plan:  Vitamin B injection today. Start OTC vitamin B complex. Work on diet and exercise. Continue all medications.   Problem List Items Addressed This Visit     Diabetes mellitus without complication (HCC) - Primary   Mixed hyperlipidemia   Relevant Medications   metoprolol succinate (TOPROL-XL) 50 MG 24 hr tablet   Chronic diastolic congestive heart failure (HCC)   Relevant Medications   metoprolol succinate (TOPROL-XL) 50 MG 24 hr tablet   Essential hypertension, benign   Relevant Medications   metoprolol succinate (TOPROL-XL) 50 MG 24 hr tablet   Vitamin B12 deficiency   Relevant Medications   cyanocobalamin (VITAMIN B12) injection 1,000 mcg   CAD S/P percutaneous coronary angioplasty   Relevant Medications   metoprolol succinate (TOPROL-XL) 50 MG 24 hr tablet    Return in about 3 months (  around 09/01/2023).   Total time spent: 25 minutes  Margaretann Loveless, MD  06/01/2023   This document may have been prepared by [facility] Voice Recognition software and as such may include unintentional dictation errors.

## 2023-07-17 ENCOUNTER — Other Ambulatory Visit: Payer: Self-pay | Admitting: Internal Medicine

## 2023-09-02 ENCOUNTER — Encounter: Payer: Self-pay | Admitting: Internal Medicine

## 2023-09-02 ENCOUNTER — Ambulatory Visit: Payer: Medicare Other | Admitting: Internal Medicine

## 2023-09-02 VITALS — BP 130/70 | HR 56 | Ht 68.0 in | Wt 238.2 lb

## 2023-09-02 DIAGNOSIS — E559 Vitamin D deficiency, unspecified: Secondary | ICD-10-CM | POA: Insufficient documentation

## 2023-09-02 DIAGNOSIS — J301 Allergic rhinitis due to pollen: Secondary | ICD-10-CM | POA: Diagnosis not present

## 2023-09-02 DIAGNOSIS — E538 Deficiency of other specified B group vitamins: Secondary | ICD-10-CM

## 2023-09-02 DIAGNOSIS — Z9861 Coronary angioplasty status: Secondary | ICD-10-CM

## 2023-09-02 DIAGNOSIS — I10 Essential (primary) hypertension: Secondary | ICD-10-CM | POA: Diagnosis not present

## 2023-09-02 DIAGNOSIS — E782 Mixed hyperlipidemia: Secondary | ICD-10-CM

## 2023-09-02 DIAGNOSIS — E1165 Type 2 diabetes mellitus with hyperglycemia: Secondary | ICD-10-CM | POA: Insufficient documentation

## 2023-09-02 DIAGNOSIS — I2510 Atherosclerotic heart disease of native coronary artery without angina pectoris: Secondary | ICD-10-CM

## 2023-09-02 MED ORDER — AZELASTINE HCL 0.1 % NA SOLN: 2.0000 | Freq: Two times a day (BID) | NASAL | 3 refills | Status: AC

## 2023-09-02 NOTE — Progress Notes (Signed)
Established Patient Office Visit  Subjective:  Patient ID: Roger [name], male    DOB: [birth year]  Age: 77 y.o. MRN: [id number]  Chief Complaint  Patient presents with   Follow-up    3 month follow up    Patient is here for his 70-month follow-up.  He is generally feeling well but mentions nasal and sinus congestion due to allergies and weather change.  He had stopped his Astelin nasal spray but will resume now.  No complaints of cough or chest congestion, no fevers and no chills. He will get his labs done today.    No other concerns at this time.   Past Medical History:  Diagnosis Date   Anemia    Arthritis    Atrial fibrillation (HCC)    Bladder stone    Dysrhythmia    GERD (gastroesophageal reflux disease)    Hypertension     Past Surgical History:  Procedure Laterality Date   CATARACT EXTRACTION     CORONARY STENT INTERVENTION N/A 04/20/2019   Procedure: CORONARY STENT INTERVENTION;  Surgeon: Alwyn Pea, MD;  Location: [facility];  Service: Cardiovascular;  Laterality: N/A;   CYSTOSCOPY WITH LITHOLAPAXY N/A 10/06/2019   Procedure: CYSTOSCOPY WITH LITHOLAPAXY;  Surgeon: Sondra Come, MD;  Location: [facility];  Service: Urology;  Laterality: N/A;   HIP SURGERY     HOLEP-LASER ENUCLEATION OF THE PROSTATE WITH MORCELLATION N/A 10/06/2019   Procedure: HOLEP-LASER ENUCLEATION OF THE PROSTATE WITH MORCELLATION;  Surgeon: Sondra Come, MD;  Location: [facility];  Service: Urology;  Laterality: N/A;   JOINT REPLACEMENT Right 2012   hip right   LEFT HEART CATH AND CORONARY ANGIOGRAPHY Left 04/20/2019   Procedure: LEFT HEART CATH AND CORONARY ANGIOGRAPHY;  Surgeon: Laurier Nancy, MD;  Location: [facility];  Service: Cardiovascular;  Laterality: Left;    Social History   Socioeconomic History   Marital status: Single    Spouse name: Not on file   Number of children: Not on file   Years of education: Not on file   Highest education level:  Not on file  Occupational History   Not on file  Tobacco Use   Smoking status: Every Day    Current packs/day: 0.50    Types: Cigarettes   Smokeless tobacco: Never  Vaping Use   Vaping status: Never Used  Substance and Sexual Activity   Alcohol use: Yes    Comment: rare   Drug use: No   Sexual activity: Not on file  Other Topics Concern   Not on file  Social History Narrative   Not on file   Social Determinants of Health   Financial Resource Strain: Not on file  Food Insecurity: No Food Insecurity (09/09/2022)   Hunger Vital Sign    Worried About Running Out of Food in the Last Year: Never true    Ran Out of Food in the Last Year: Never true  Transportation Needs: No Transportation Needs (09/09/2022)   PRAPARE - Administrator, Civil Service (Medical): No    Lack of Transportation (Non-Medical): No  Physical Activity: Not on file  Stress: Not on file  Social Connections: Not on file  Intimate Partner Violence: Not on file    History reviewed. No pertinent family history.  Allergies  Allergen Reactions   Amoxicillin Anaphylaxis    Did it involve swelling of the face/tongue/throat, SOB, or low BP? Yes Did it involve sudden or severe rash/hives, skin  peeling, or any reaction on the inside of your mouth or nose? No Did you need to seek medical attention at a hospital or doctor's office? No When did it last happen?      10+ years If all above answers are "NO", may proceed with cephalosporin use.    Sulfa Antibiotics Anaphylaxis   Sulfur Dioxide Anaphylaxis   Penicillin G Swelling   Ibuprofen     Stomachache   Pravastatin Other (See Comments)    Irritable bowel   Rosuvastatin Other (See Comments)    Irritable bowel    Review of Systems  Constitutional: Negative.   HENT:  Positive for congestion. Negative for ear discharge, ear pain, nosebleeds, sinus pain and sore throat.   Eyes: Negative.   Respiratory: Negative.  Negative for cough, shortness of  breath, wheezing and stridor.   Cardiovascular: Negative.  Negative for chest pain, palpitations and leg swelling.  Gastrointestinal: Negative.  Negative for abdominal pain, constipation, diarrhea, heartburn, nausea and vomiting.  Genitourinary: Negative.  Negative for dysuria and flank pain.  Musculoskeletal: Negative.  Negative for joint pain and myalgias.  Skin: Negative.   Neurological: Negative.  Negative for dizziness and headaches.  Endo/Heme/Allergies: Negative.   Psychiatric/Behavioral: Negative.  Negative for depression and suicidal ideas. The patient is not nervous/anxious.        Objective:   BP 130/70   Pulse (!) 56   Ht 5\' 8"  (1.727 m)   Wt 238 lb 3.2 oz (108 kg)   SpO2 98%   BMI 36.22 kg/m   Vitals:   09/02/23 0945  BP: 130/70  Pulse: (!) 56  Height: 5\' 8"  (1.727 m)  Weight: 238 lb 3.2 oz (108 kg)  SpO2: 98%  BMI (Calculated): 36.23    Physical Exam Vitals and nursing note reviewed.  Constitutional:      General: He is not in acute distress.    Appearance: Normal appearance.  HENT:     Head: Normocephalic and atraumatic.     Nose: Nose normal.     Mouth/Throat:     Mouth: Mucous membranes are moist.     Pharynx: Oropharynx is clear.  Eyes:     Conjunctiva/sclera: Conjunctivae normal.     Pupils: Pupils are equal, round, and reactive to light.  Cardiovascular:     Rate and Rhythm: Normal rate and regular rhythm.     Pulses: Normal pulses.     Heart sounds: Normal heart sounds.  Pulmonary:     Effort: Pulmonary effort is normal.     Breath sounds: Normal breath sounds. No wheezing, rhonchi or rales.  Abdominal:     General: Bowel sounds are normal.     Palpations: Abdomen is soft. There is no mass.     Tenderness: There is no abdominal tenderness. There is no right CVA tenderness, left CVA tenderness, guarding or rebound.     Hernia: No hernia is present.  Musculoskeletal:        General: Normal range of motion.     Cervical back: Normal range  of motion.     Right lower leg: No edema.     Left lower leg: No edema.  Skin:    General: Skin is warm and dry.     Findings: No rash.  Neurological:     General: No focal deficit present.     Mental Status: He is alert and oriented to person, place, and time.  Psychiatric:        Mood and Affect: Mood  normal.        Behavior: Behavior normal.        Judgment: Judgment normal.      No results found for any visits on 09/02/23.  No results found for this or any previous visit (from the past 2160 hour(s)).    Assessment & Plan:  Patient will get labs done today..  Advised to continue all his medications and to resume his allergy nasal spray. Problem List Items Addressed This Visit     Mixed hyperlipidemia   Relevant Medications   ezetimibe (ZETIA) 10 MG tablet   Other Relevant Orders   Lipid Panel w/o Chol/HDL Ratio   Essential hypertension, benign   Relevant Medications   ezetimibe (ZETIA) 10 MG tablet   Other Relevant Orders   CMP14+EGFR   Vitamin B12 deficiency   Relevant Orders   Vitamin B12   CAD S/P percutaneous coronary angioplasty   Relevant Medications   ezetimibe (ZETIA) 10 MG tablet   Other Relevant Orders   CBC with Diff   Type 2 diabetes mellitus with hyperglycemia, without long-term current use of insulin (HCC) - Primary   Relevant Orders   Hemoglobin A1c   Seasonal allergic rhinitis due to pollen   Relevant Medications   azelastine (ASTELIN) 0.1 % nasal spray   Vitamin D deficiency   Relevant Orders   Vitamin D (25 hydroxy)    Return in about 3 months (around 12/02/2023).   Total time spent: 30 minutes  Margaretann Loveless, MD  09/02/2023   This document may have been prepared by [facility] Voice Recognition software and as such may include unintentional dictation errors.

## 2023-09-03 LAB — CBC WITH DIFFERENTIAL/PLATELET
Basophils Absolute: 0.1 10*3/uL (ref 0.0–0.2)
Basos: 1 %
EOS (ABSOLUTE): 0.1 10*3/uL (ref 0.0–0.4)
Eos: 1 %
Hematocrit: 43.8 % (ref 37.5–51.0)
Hemoglobin: 13.8 g/dL (ref 13.0–17.7)
Immature Grans (Abs): 0 10*3/uL (ref 0.0–0.1)
Immature Granulocytes: 1 %
Lymphocytes Absolute: 1.1 10*3/uL (ref 0.7–3.1)
Lymphs: 13 %
MCH: 31.4 pg (ref 26.6–33.0)
MCHC: 31.5 g/dL (ref 31.5–35.7)
MCV: 100 fL — ABNORMAL HIGH (ref 79–97)
Monocytes Absolute: 0.7 10*3/uL (ref 0.1–0.9)
Monocytes: 8 %
Neutrophils Absolute: 6.5 10*3/uL (ref 1.4–7.0)
Neutrophils: 76 %
Platelets: 247 10*3/uL (ref 150–450)
RBC: 4.39 x10E6/uL (ref 4.14–5.80)
RDW: 13.5 % (ref 11.6–15.4)
WBC: 8.5 10*3/uL (ref 3.4–10.8)

## 2023-09-03 LAB — LIPID PANEL W/O CHOL/HDL RATIO
Cholesterol, Total: 91 mg/dL — ABNORMAL LOW (ref 100–199)
HDL: 47 mg/dL (ref >39)
LDL Chol Calc (NIH): 24 mg/dL (ref 0–99)
Triglycerides: 107 mg/dL (ref 0–149)
VLDL Cholesterol Cal: 20 mg/dL (ref 5–40)

## 2023-09-03 LAB — CMP14+EGFR
ALT: 8 [IU]/L (ref 0–44)
AST: 14 [IU]/L (ref 0–40)
Albumin: 4 g/dL (ref 3.8–4.8)
Alkaline Phosphatase: 63 [IU]/L (ref 44–121)
BUN/Creatinine Ratio: 18 (ref 10–24)
BUN: 21 mg/dL (ref 8–27)
Bilirubin Total: 0.9 mg/dL (ref 0.0–1.2)
CO2: 26 mmol/L (ref 20–29)
Calcium: 9 mg/dL (ref 8.6–10.2)
Chloride: 102 mmol/L (ref 96–106)
Creatinine, Ser: 1.17 mg/dL (ref 0.76–1.27)
Globulin, Total: 2 g/dL (ref 1.5–4.5)
Glucose: 104 mg/dL — ABNORMAL HIGH (ref 70–99)
Potassium: 4.8 mmol/L (ref 3.5–5.2)
Sodium: 141 mmol/L (ref 134–144)
Total Protein: 6 g/dL (ref 6.0–8.5)
eGFR: 65 mL/min/{1.73_m2} (ref >59)

## 2023-09-03 LAB — VITAMIN B12: Vitamin B-12: 313 pg/mL (ref 232–1245)

## 2023-09-03 LAB — VITAMIN D 25 HYDROXY (VIT D DEFICIENCY, FRACTURES): Vit D, 25-Hydroxy: 31.3 ng/mL (ref 30.0–100.0)

## 2023-09-03 LAB — HEMOGLOBIN A1C
Est. average glucose Bld gHb Est-mCnc: 146 mg/dL
Hgb A1c MFr Bld: 6.7 % — ABNORMAL HIGH (ref 4.8–5.6)

## 2023-09-03 NOTE — Progress Notes (Signed)
Patient notified

## 2023-09-10 ENCOUNTER — Ambulatory Visit (INDEPENDENT_AMBULATORY_CARE_PROVIDER_SITE_OTHER): Payer: Medicare Other

## 2023-09-10 DIAGNOSIS — I361 Nonrheumatic tricuspid (valve) insufficiency: Secondary | ICD-10-CM | POA: Diagnosis not present

## 2023-09-10 DIAGNOSIS — I4891 Unspecified atrial fibrillation: Secondary | ICD-10-CM | POA: Diagnosis not present

## 2023-09-10 DIAGNOSIS — I10 Essential (primary) hypertension: Secondary | ICD-10-CM

## 2023-09-10 DIAGNOSIS — I5032 Chronic diastolic (congestive) heart failure: Secondary | ICD-10-CM

## 2023-09-10 DIAGNOSIS — I340 Nonrheumatic mitral (valve) insufficiency: Secondary | ICD-10-CM | POA: Diagnosis not present

## 2023-09-17 ENCOUNTER — Ambulatory Visit (INDEPENDENT_AMBULATORY_CARE_PROVIDER_SITE_OTHER): Payer: Medicare Other | Admitting: Cardiovascular Disease

## 2023-09-17 ENCOUNTER — Encounter: Payer: Self-pay | Admitting: Cardiovascular Disease

## 2023-09-17 VITALS — BP 140/85 | HR 87 | Ht 68.0 in | Wt 238.6 lb

## 2023-09-17 DIAGNOSIS — E119 Type 2 diabetes mellitus without complications: Secondary | ICD-10-CM

## 2023-09-17 DIAGNOSIS — E782 Mixed hyperlipidemia: Secondary | ICD-10-CM | POA: Diagnosis not present

## 2023-09-17 DIAGNOSIS — I2510 Atherosclerotic heart disease of native coronary artery without angina pectoris: Secondary | ICD-10-CM

## 2023-09-17 DIAGNOSIS — I4811 Longstanding persistent atrial fibrillation: Secondary | ICD-10-CM

## 2023-09-17 DIAGNOSIS — Z9861 Coronary angioplasty status: Secondary | ICD-10-CM

## 2023-09-17 DIAGNOSIS — I10 Essential (primary) hypertension: Secondary | ICD-10-CM

## 2023-09-17 DIAGNOSIS — I5032 Chronic diastolic (congestive) heart failure: Secondary | ICD-10-CM | POA: Diagnosis not present

## 2023-09-17 MED ORDER — ELIQUIS 2.5 MG PO TABS
2.5000 mg | ORAL_TABLET | Freq: Two times a day (BID) | ORAL | 3 refills | Status: AC
Start: 2023-09-17 — End: ?

## 2023-09-17 NOTE — Progress Notes (Signed)
Cardiology Office Note   Date:  09/17/2023   ID:  Roger [name], DOB [birth year], MRN [id number]  PCP:  Margaretann Loveless, MD  Cardiologist:  Adrian Blackwater, MD      History of Present Illness: Roger [name] is a 77 y.o. male who presents for  Chief Complaint  Patient presents with   Follow-up    echo    Bp is high      Past Medical History:  Diagnosis Date   Anemia    Arthritis    Atrial fibrillation (HCC)    Bladder stone    Dysrhythmia    GERD (gastroesophageal reflux disease)    Hypertension      Past Surgical History:  Procedure Laterality Date   CATARACT EXTRACTION     CORONARY STENT INTERVENTION N/A 04/20/2019   Procedure: CORONARY STENT INTERVENTION;  Surgeon: Alwyn Pea, MD;  Location: [facility];  Service: Cardiovascular;  Laterality: N/A;   CYSTOSCOPY WITH LITHOLAPAXY N/A 10/06/2019   Procedure: CYSTOSCOPY WITH LITHOLAPAXY;  Surgeon: Sondra Come, MD;  Location: [facility];  Service: Urology;  Laterality: N/A;   HIP SURGERY     HOLEP-LASER ENUCLEATION OF THE PROSTATE WITH MORCELLATION N/A 10/06/2019   Procedure: HOLEP-LASER ENUCLEATION OF THE PROSTATE WITH MORCELLATION;  Surgeon: Sondra Come, MD;  Location: [facility];  Service: Urology;  Laterality: N/A;   JOINT REPLACEMENT Right 2012   hip right   LEFT HEART CATH AND CORONARY ANGIOGRAPHY Left 04/20/2019   Procedure: LEFT HEART CATH AND CORONARY ANGIOGRAPHY;  Surgeon: Laurier Nancy, MD;  Location: [facility];  Service: Cardiovascular;  Laterality: Left;     Current Outpatient Medications  Medication Sig Dispense Refill   azelastine (ASTELIN) 0.1 % nasal spray Place 2 sprays into both nostrils 2 (two) times daily. 30 mL 3   Bempedoic Acid-Ezetimibe (NEXLIZET) 180-10 MG TABS Take 1 tablet by mouth daily at 6 (six) AM.     Cholecalciferol (VITAMIN D3) 1.25 MG (50000 UT) CAPS Take 50,000 Units by mouth every Thursday.  (Patient not taking: Reported on 09/02/2023)      Cholecalciferol 25 MCG (1000 UT) tablet Take 1 tablet by mouth daily. (Patient not taking: Reported on 09/02/2023)     ELIQUIS 2.5 MG TABS tablet Take 1 tablet (2.5 mg total) by mouth 2 (two) times daily. 60 tablet 3   enalapril (VASOTEC) 10 MG tablet TAKE ONE-HALF TABLET BY MOUTH EVERY DAY HIGH BLOOD PRESSURE     ergocalciferol (VITAMIN D2) 1.25 MG (50000 UT) capsule Take 50,000 Units by mouth once a week. Thursday (Patient not taking: Reported on 09/02/2023)     ezetimibe (ZETIA) 10 MG tablet Take 10 mg by mouth daily.     hydrocortisone (ANUSOL-HC) 25 MG suppository Place 1 suppository (25 mg total) rectally 2 (two) times daily. 12 suppository 0   ketoconazole (NIZORAL) 2 % shampoo Apply topically.     ketotifen (ZADITOR) 0.025 % ophthalmic solution Place 1 drop into both eyes daily as needed (allergies).     metoprolol succinate (TOPROL-XL) 50 MG 24 hr tablet Take 50 mg by mouth daily. Take with or immediately following a meal.     mupirocin ointment (BACTROBAN) 2 % Apply topically. (Patient not taking: Reported on 09/02/2023)     pantoprazole (PROTONIX) 40 MG tablet Take 1 tablet (40 mg total) by mouth daily. 30 tablet 0   REPATHA SURECLICK 140 MG/ML SOAJ ADMINISTER 1 INJECTION UNDER THE SKIN EVERY 2 WEEKS  2 mL 3   spironolactone (ALDACTONE) 25 MG tablet Take 12.5 mg by mouth daily.     Current Facility-Administered Medications  Medication Dose Route Frequency Provider Last Rate Last Admin   cyanocobalamin (VITAMIN B12) injection 1,000 mcg  1,000 mcg Intramuscular Once Margaretann Loveless, MD        Allergies:   Amoxicillin, Sulfa antibiotics, Sulfur dioxide, Penicillin g, Ibuprofen, Pravastatin, and Rosuvastatin    Social History:   reports that he has been smoking cigarettes. He has never used smokeless tobacco. He reports current alcohol use. He reports that he does not use drugs.   Family History:  family history is not on file.    ROS:     Review of Systems  Constitutional: Negative.    HENT: Negative.    Eyes: Negative.   Respiratory: Negative.    Gastrointestinal: Negative.   Genitourinary: Negative.   Musculoskeletal: Negative.   Skin: Negative.   Neurological: Negative.   Endo/Heme/Allergies: Negative.   Psychiatric/Behavioral: Negative.    All other systems reviewed and are negative.     All other systems are reviewed and negative.    PHYSICAL EXAM: VS:  BP (!) 140/85   Pulse 87   Ht 5\' 8"  (1.727 m)   Wt 238 lb 9.6 oz (108.2 kg)   SpO2 93%   BMI 36.28 kg/m  , BMI Body mass index is 36.28 kg/m. Last weight:  Wt Readings from Last 3 Encounters:  09/17/23 238 lb 9.6 oz (108.2 kg)  09/02/23 238 lb 3.2 oz (108 kg)  06/01/23 238 lb 3.2 oz (108 kg)     Physical Exam Vitals reviewed.  Constitutional:      Appearance: Normal appearance. He is normal weight.  HENT:     Head: Normocephalic.     Nose: Nose normal.     Mouth/Throat:     Mouth: Mucous membranes are moist.  Eyes:     Pupils: Pupils are equal, round, and reactive to light.  Cardiovascular:     Rate and Rhythm: Normal rate and regular rhythm.     Pulses: Normal pulses.     Heart sounds: Normal heart sounds.  Pulmonary:     Effort: Pulmonary effort is normal.  Abdominal:     General: Abdomen is flat. Bowel sounds are normal.  Musculoskeletal:        General: Normal range of motion.     Cervical back: Normal range of motion.  Skin:    General: Skin is warm.  Neurological:     General: No focal deficit present.     Mental Status: He is alert.  Psychiatric:        Mood and Affect: Mood normal.       EKG:   Recent Labs: 09/02/2023: ALT 8; BUN 21; Creatinine, Ser 1.17; Hemoglobin 13.8; Platelets 247; Potassium 4.8; Sodium 141    Lipid Panel    Component Value Date/Time   CHOL 91 (L) 09/02/2023 1037   TRIG 107 09/02/2023 1037   HDL 47 09/02/2023 1037   LDLCALC 24 09/02/2023 1037      Other studies Reviewed: Additional studies/ records that were reviewed today  include:  Review of the above records demonstrates:       No data to display            ASSESSMENT AND PLAN:    ICD-10-CM   1. Chronic diastolic congestive heart failure (HCC)  I50.32 ELIQUIS 2.5 MG TABS tablet    2. Essential hypertension, benign  I10 ELIQUIS 2.5 MG TABS tablet   Repeat BP 120/70    3. Mixed hyperlipidemia  E78.2 ELIQUIS 2.5 MG TABS tablet   LDL 24 , on repatha    4. CAD S/P percutaneous coronary angioplasty  I25.10 ELIQUIS 2.5 MG TABS tablet   Z98.61    no chest pain    5. Longstanding persistent atrial fibrillation (HCC)  I48.11 ELIQUIS 2.5 MG TABS tablet    6. Diabetes mellitus without complication (HCC)  E11.9 ELIQUIS 2.5 MG TABS tablet       Problem List Items Addressed This Visit       Cardiovascular and Mediastinum   Chronic diastolic congestive heart failure (HCC) - Primary   Relevant Medications   ELIQUIS 2.5 MG TABS tablet   Longstanding persistent atrial fibrillation (HCC)   Relevant Medications   ELIQUIS 2.5 MG TABS tablet   Essential hypertension, benign   Relevant Medications   ELIQUIS 2.5 MG TABS tablet   CAD S/P percutaneous coronary angioplasty   Relevant Medications   ELIQUIS 2.5 MG TABS tablet     Endocrine   Diabetes mellitus without complication (HCC)   Relevant Medications   ELIQUIS 2.5 MG TABS tablet     Other   Mixed hyperlipidemia   Relevant Medications   ELIQUIS 2.5 MG TABS tablet       Disposition:   No follow-ups on file.    Total time spent: 30 minutes  Signed,  Adrian Blackwater, MD  09/17/2023 10:47 AM    [facility]

## 2023-09-26 ENCOUNTER — Other Ambulatory Visit: Payer: Self-pay

## 2023-09-26 ENCOUNTER — Emergency Department
Admission: EM | Admit: 2023-09-26 | Discharge: 2023-09-27 | Disposition: A | Discharge status: Home / Self Care | Payer: Medicare Other | Attending: Emergency Medicine | Admitting: Emergency Medicine

## 2023-09-26 DIAGNOSIS — W268XXA Contact with other sharp object(s), not elsewhere classified, initial encounter: Secondary | ICD-10-CM | POA: Diagnosis not present

## 2023-09-26 DIAGNOSIS — Z7901 Long term (current) use of anticoagulants: Secondary | ICD-10-CM | POA: Diagnosis not present

## 2023-09-26 DIAGNOSIS — S59912A Unspecified injury of left forearm, initial encounter: Secondary | ICD-10-CM | POA: Diagnosis present

## 2023-09-26 DIAGNOSIS — S51812A Laceration without foreign body of left forearm, initial encounter: Secondary | ICD-10-CM | POA: Insufficient documentation

## 2023-09-26 NOTE — ED Triage Notes (Signed)
Skin abrasion to patient's LEFT wrist from when he was taking off his watch; HE is on blood thinners and was haivng difficulty controlling the bleeding; Wound is not deep, superficial layer of skin about 4 cm long and 2 cm wide; Wound was cleaned with saline and iodine solution and covered with non-adherent dressing. Bleeding controlled and wound covered

## 2023-09-27 NOTE — ED Provider Notes (Signed)
[facility] Provider Note    Event Date/Time   First MD Initiated Contact with Patient 09/26/23 2349     (approximate)   History   Abrasion   HPI Roger [name] is a 77 y.o. male with history of A-fib currently on Eliquis.  He presents for evaluation of a skin tear to his left wrist.  The patient was wearing a watch on his left wrist and somehow scraped it along a surface and caused a skin tear.  Because he is on Eliquis, he could not get it to stop oozing blood, so he came in for evaluation.  He has no other injuries and no limitation of range of motion.  He did not fall.     Physical Exam   Triage Vital Signs: ED Triage Vitals [09/26/23 1955]  Encounter Vitals Group     BP 132/80     Systolic BP Percentile      Diastolic BP Percentile      Pulse Rate (!) 104     Resp 18     Temp 98.5 F (36.9 C)     Temp Source Oral     SpO2 95 %     Weight      Height      Head Circumference      Peak Flow      Pain Score      Pain Loc      Pain Education      Exclude from Growth Chart     Most recent vital signs: Vitals:   09/26/23 1955  BP: 132/80  Pulse: (!) 104  Resp: 18  Temp: 98.5 F (36.9 C)  SpO2: 95%    General: Awake, no distress.  CV:  Good peripheral perfusion.  Resp:  Normal effort. Speaking easily and comfortably, no accessory muscle usage nor intercostal retractions.   Abd:  No distention.  Other:  Small superficial skin tear to the left wrist.  Surgicel applied to the wound and the bleeding has stopped.   ED Results / Procedures / Treatments   Labs (all labs ordered are listed, but only abnormal results are displayed) Labs Reviewed - No data to display     PROCEDURES:  Critical Care performed: No  Procedures    IMPRESSION / MDM / ASSESSMENT AND PLAN / ED COURSE  I reviewed the triage vital signs and the nursing notes.                              Differential diagnosis includes, but is not limited to,  skin tear, laceration, orthopedic injury.  Patient's presentation is most consistent with acute, uncomplicated illness.    Surgicel applied to wound in triage, and I verified that the bleeding is well-controlled.  I gave the patient my usual and customary management recommendations and follow-up plan and he agrees.  No other intervention indicated.  Patient slightly tachycardic upon arrival but I think this is situational and his heart rate has normalized by the time I saw him.         FINAL CLINICAL IMPRESSION(S) / ED DIAGNOSES   Final diagnoses:  Skin tear of forearm without complication, left, initial encounter     Rx / DC Orders   ED Discharge Orders     None        Note:  This document was prepared using Dragon voice recognition software and may include unintentional dictation errors.  Loleta Rose, MD 09/27/23 [phone]

## 2023-09-27 NOTE — Discharge Instructions (Signed)
We put a thin layer of a material called Surgicel on your wound, which helped it stop bleeding.  It will heal over time.  Do NOT try to pull off the Surgicel, which is directly on and in the wound beneath the bandage.  You can leave the gauze in place and keep it clean and dry.  Remove it after a day, but continue to try and keep the wound clean and dry.  Follow up with your regular provider or nurse at your convenience.

## 2023-11-08 ENCOUNTER — Other Ambulatory Visit: Payer: Self-pay | Admitting: Internal Medicine

## 2023-11-28 ENCOUNTER — Other Ambulatory Visit: Payer: Self-pay | Admitting: Internal Medicine

## 2023-12-03 ENCOUNTER — Ambulatory Visit (INDEPENDENT_AMBULATORY_CARE_PROVIDER_SITE_OTHER): Payer: Medicare Other | Admitting: Internal Medicine

## 2023-12-03 ENCOUNTER — Encounter: Payer: Self-pay | Admitting: Internal Medicine

## 2023-12-03 VITALS — BP 108/74 | HR 89 | Ht 68.0 in | Wt 242.0 lb

## 2023-12-03 DIAGNOSIS — I5032 Chronic diastolic (congestive) heart failure: Secondary | ICD-10-CM | POA: Diagnosis not present

## 2023-12-03 DIAGNOSIS — E782 Mixed hyperlipidemia: Secondary | ICD-10-CM | POA: Diagnosis not present

## 2023-12-03 DIAGNOSIS — I10 Essential (primary) hypertension: Secondary | ICD-10-CM | POA: Diagnosis not present

## 2023-12-03 DIAGNOSIS — Z9861 Coronary angioplasty status: Secondary | ICD-10-CM

## 2023-12-03 DIAGNOSIS — K219 Gastro-esophageal reflux disease without esophagitis: Secondary | ICD-10-CM

## 2023-12-03 DIAGNOSIS — I2510 Atherosclerotic heart disease of native coronary artery without angina pectoris: Secondary | ICD-10-CM

## 2023-12-03 DIAGNOSIS — E1159 Type 2 diabetes mellitus with other circulatory complications: Secondary | ICD-10-CM | POA: Insufficient documentation

## 2023-12-03 DIAGNOSIS — E1165 Type 2 diabetes mellitus with hyperglycemia: Secondary | ICD-10-CM

## 2023-12-03 NOTE — Progress Notes (Signed)
Established Patient Office Visit  Subjective:  Patient ID: Roger [name], male    DOB: 20-Aug-1946  Age: 77 y.o. MRN: [id number]  Chief Complaint  Patient presents with   Follow-up    3 month follow up    Patient is here for his follow-up.  He has no new complaints.  Blood pressure is stable.  Taking all his medications as prescribed.    No other concerns at this time.   Past Medical History:  Diagnosis Date   Anemia    Arthritis    Atrial fibrillation (HCC)    Bladder stone    Dysrhythmia    GERD (gastroesophageal reflux disease)    Hypertension     Past Surgical History:  Procedure Laterality Date   CATARACT EXTRACTION     CORONARY STENT INTERVENTION N/A 04/20/2019   Procedure: CORONARY STENT INTERVENTION;  Surgeon: Alwyn Pea, MD;  Location: [facility];  Service: Cardiovascular;  Laterality: N/A;   CYSTOSCOPY WITH LITHOLAPAXY N/A 10/06/2019   Procedure: CYSTOSCOPY WITH LITHOLAPAXY;  Surgeon: Sondra Come, MD;  Location: [facility];  Service: Urology;  Laterality: N/A;   HIP SURGERY     HOLEP-LASER ENUCLEATION OF THE PROSTATE WITH MORCELLATION N/A 10/06/2019   Procedure: HOLEP-LASER ENUCLEATION OF THE PROSTATE WITH MORCELLATION;  Surgeon: Sondra Come, MD;  Location: [facility];  Service: Urology;  Laterality: N/A;   JOINT REPLACEMENT Right 2012   hip right   LEFT HEART CATH AND CORONARY ANGIOGRAPHY Left 04/20/2019   Procedure: LEFT HEART CATH AND CORONARY ANGIOGRAPHY;  Surgeon: Laurier Nancy, MD;  Location: [facility];  Service: Cardiovascular;  Laterality: Left;    Social History   Socioeconomic History   Marital status: Single    Spouse name: Not on file   Number of children: Not on file   Years of education: Not on file   Highest education level: Not on file  Occupational History   Not on file  Tobacco Use   Smoking status: Every Day    Current packs/day: 0.50    Types: Cigarettes   Smokeless tobacco: Never  Vaping  Use   Vaping status: Never Used  Substance and Sexual Activity   Alcohol use: Yes    Comment: rare   Drug use: No   Sexual activity: Not on file  Other Topics Concern   Not on file  Social History Narrative   Not on file   Social Drivers of Health   Financial Resource Strain: Not on file  Food Insecurity: No Food Insecurity (09/09/2022)   Hunger Vital Sign    Worried About Running Out of Food in the Last Year: Never true    Ran Out of Food in the Last Year: Never true  Transportation Needs: No Transportation Needs (09/09/2022)   PRAPARE - Administrator, Civil Service (Medical): No    Lack of Transportation (Non-Medical): No  Physical Activity: Not on file  Stress: Not on file  Social Connections: Not on file  Intimate Partner Violence: Not on file    History reviewed. No pertinent family history.  Allergies  Allergen Reactions   Amoxicillin Anaphylaxis    Did it involve swelling of the face/tongue/throat, SOB, or low BP? Yes Did it involve sudden or severe rash/hives, skin peeling, or any reaction on the inside of your mouth or nose? No Did you need to seek medical attention at a hospital or doctor's office? No When did it last happen?  10+ years If all above answers are "NO", may proceed with cephalosporin use.    Sulfa Antibiotics Anaphylaxis   Sulfur Dioxide Anaphylaxis   Penicillin G Swelling   Ibuprofen     Stomachache   Pravastatin Other (See Comments)    Irritable bowel   Rosuvastatin Other (See Comments)    Irritable bowel    Outpatient Medications Prior to Visit  Medication Sig   azelastine (ASTELIN) 0.1 % nasal spray Place 2 sprays into both nostrils 2 (two) times daily.   Bempedoic Acid-Ezetimibe (NEXLIZET) 180-10 MG TABS TAKE 1 TABLET BY MOUTH EVERY DAY   ELIQUIS 2.5 MG TABS tablet Take 1 tablet (2.5 mg total) by mouth 2 (two) times daily.   empagliflozin (JARDIANCE) 25 MG TABS tablet Take 1 tablet by mouth daily.   enalapril  (VASOTEC) 10 MG tablet TAKE ONE-HALF TABLET BY MOUTH EVERY DAY HIGH BLOOD PRESSURE   ezetimibe (ZETIA) 10 MG tablet Take 10 mg by mouth daily.   hydrocortisone (ANUSOL-HC) 25 MG suppository Place 1 suppository (25 mg total) rectally 2 (two) times daily.   ketoconazole (NIZORAL) 2 % shampoo Apply topically.   ketotifen (ZADITOR) 0.025 % ophthalmic solution Place 1 drop into both eyes daily as needed (allergies).   metoprolol succinate (TOPROL-XL) 50 MG 24 hr tablet Take 50 mg by mouth daily. Take with or immediately following a meal.   pantoprazole (PROTONIX) 40 MG tablet Take 1 tablet (40 mg total) by mouth daily.   REPATHA SURECLICK 140 MG/ML SOAJ ADMINISTER 1 INJECTION UNDER THE SKIN EVERY 2 WEEKS   spironolactone (ALDACTONE) 25 MG tablet Take 12.5 mg by mouth daily.   Cholecalciferol (VITAMIN D3) 1.25 MG (50000 UT) CAPS Take 50,000 Units by mouth every Thursday.  (Patient not taking: Reported on 09/02/2023)   Cholecalciferol 25 MCG (1000 UT) tablet Take 1 tablet by mouth daily. (Patient not taking: Reported on 12/03/2023)   ergocalciferol (VITAMIN D2) 1.25 MG (50000 UT) capsule Take 50,000 Units by mouth once a week. Thursday (Patient not taking: Reported on 09/02/2023)   mupirocin ointment (BACTROBAN) 2 % Apply topically. (Patient not taking: Reported on 12/03/2023)   Facility-Administered Medications Prior to Visit  Medication Dose Route Frequency Provider   cyanocobalamin (VITAMIN B12) injection 1,000 mcg  1,000 mcg Intramuscular Once Margaretann Loveless, MD    Review of Systems  Constitutional: Negative.  Negative for fever, malaise/fatigue and weight loss.  HENT: Negative.    Eyes: Negative.   Respiratory: Negative.  Negative for cough and shortness of breath.   Cardiovascular: Negative.  Negative for chest pain, palpitations and leg swelling.  Gastrointestinal: Negative.  Negative for abdominal pain, constipation, diarrhea, heartburn, nausea and vomiting.  Genitourinary: Negative.   Negative for dysuria and flank pain.  Musculoskeletal: Negative.  Negative for joint pain and myalgias.  Skin: Negative.   Neurological: Negative.  Negative for dizziness and headaches.  Endo/Heme/Allergies: Negative.   Psychiatric/Behavioral: Negative.  Negative for depression and suicidal ideas. The patient is not nervous/anxious.        Objective:   BP 108/74   Pulse 89   Ht 5\' 8"  (1.727 m)   Wt 242 lb (109.8 kg)   SpO2 95%   BMI 36.80 kg/m   Vitals:   12/03/23 1010  BP: 108/74  Pulse: 89  Height: 5\' 8"  (1.727 m)  Weight: 242 lb (109.8 kg)  SpO2: 95%  BMI (Calculated): 36.8    Physical Exam Vitals and nursing note reviewed.  Constitutional:      Appearance:  Normal appearance.  HENT:     Head: Normocephalic and atraumatic.     Nose: Nose normal.     Mouth/Throat:     Mouth: Mucous membranes are moist.     Pharynx: Oropharynx is clear.  Eyes:     Conjunctiva/sclera: Conjunctivae normal.     Pupils: Pupils are equal, round, and reactive to light.  Cardiovascular:     Rate and Rhythm: Normal rate and regular rhythm.     Pulses: Normal pulses.     Heart sounds: Normal heart sounds.  Pulmonary:     Effort: Pulmonary effort is normal.     Breath sounds: Normal breath sounds.  Abdominal:     General: Bowel sounds are normal.     Palpations: Abdomen is soft.  Musculoskeletal:        General: Normal range of motion.     Cervical back: Normal range of motion.  Skin:    General: Skin is warm and dry.  Neurological:     General: No focal deficit present.     Mental Status: He is alert and oriented to person, place, and time.  Psychiatric:        Mood and Affect: Mood normal.        Behavior: Behavior normal.        Judgment: Judgment normal.      No results found for any visits on 12/03/23.  No results found for this or any previous visit (from the past 2160 hours).    Assessment & Plan:  Patient will continue all the medications as prescribed.  Labs  today. Problem List Items Addressed This Visit     Gastroesophageal reflux disease without esophagitis   Relevant Orders   CBC with Diff   Mixed hyperlipidemia   Relevant Orders   Lipid Panel w/o Chol/HDL Ratio   Chronic diastolic congestive heart failure (HCC)   Essential hypertension, benign   Relevant Orders   CMP14+EGFR   CAD S/P percutaneous coronary angioplasty   Type 2 diabetes mellitus with hyperglycemia, without long-term current use of insulin (HCC)   Relevant Medications   empagliflozin (JARDIANCE) 25 MG TABS tablet   Other Relevant Orders   Hemoglobin A1c   Hypertension associated with diabetes (HCC) - Primary   Relevant Medications   empagliflozin (JARDIANCE) 25 MG TABS tablet    Return in about 3 months (around 03/02/2024).   Total time spent: 30 minutes  Margaretann Loveless, MD  12/03/2023   This document may have been prepared by [facility] Voice Recognition software and as such may include unintentional dictation errors.

## 2023-12-04 LAB — HEMOGLOBIN A1C
Est. average glucose Bld gHb Est-mCnc: 151 mg/dL
Hgb A1c MFr Bld: 6.9 % — ABNORMAL HIGH (ref 4.8–5.6)

## 2023-12-04 LAB — CBC WITH DIFFERENTIAL/PLATELET
Basophils Absolute: 0.1 10*3/uL (ref 0.0–0.2)
Basos: 1 %
EOS (ABSOLUTE): 0.1 10*3/uL (ref 0.0–0.4)
Eos: 1 %
Hematocrit: 42 % (ref 37.5–51.0)
Hemoglobin: 14.2 g/dL (ref 13.0–17.7)
Immature Grans (Abs): 0.1 10*3/uL (ref 0.0–0.1)
Immature Granulocytes: 1 %
Lymphocytes Absolute: 1 10*3/uL (ref 0.7–3.1)
Lymphs: 12 %
MCH: 32.3 pg (ref 26.6–33.0)
MCHC: 33.8 g/dL (ref 31.5–35.7)
MCV: 96 fL (ref 79–97)
Monocytes Absolute: 0.6 10*3/uL (ref 0.1–0.9)
Monocytes: 7 %
Neutrophils Absolute: 6.5 10*3/uL (ref 1.4–7.0)
Neutrophils: 78 %
Platelets: 250 10*3/uL (ref 150–450)
RBC: 4.4 x10E6/uL (ref 4.14–5.80)
RDW: 13.2 % (ref 11.6–15.4)
WBC: 8.3 10*3/uL (ref 3.4–10.8)

## 2023-12-04 LAB — CMP14+EGFR
ALT: 8 [IU]/L (ref 0–44)
AST: 15 [IU]/L (ref 0–40)
Albumin: 4 g/dL (ref 3.8–4.8)
Alkaline Phosphatase: 63 [IU]/L (ref 44–121)
BUN/Creatinine Ratio: 18 (ref 10–24)
BUN: 22 mg/dL (ref 8–27)
Bilirubin Total: 0.9 mg/dL (ref 0.0–1.2)
CO2: 25 mmol/L (ref 20–29)
Calcium: 9.2 mg/dL (ref 8.6–10.2)
Chloride: 103 mmol/L (ref 96–106)
Creatinine, Ser: 1.23 mg/dL (ref 0.76–1.27)
Globulin, Total: 2 g/dL (ref 1.5–4.5)
Glucose: 109 mg/dL — ABNORMAL HIGH (ref 70–99)
Potassium: 5.1 mmol/L (ref 3.5–5.2)
Sodium: 142 mmol/L (ref 134–144)
Total Protein: 6 g/dL (ref 6.0–8.5)
eGFR: 61 mL/min/{1.73_m2} (ref >59)

## 2023-12-04 LAB — LIPID PANEL W/O CHOL/HDL RATIO
Cholesterol, Total: 85 mg/dL — ABNORMAL LOW (ref 100–199)
HDL: 50 mg/dL (ref >39)
LDL Chol Calc (NIH): 16 mg/dL (ref 0–99)
Triglycerides: 100 mg/dL (ref 0–149)
VLDL Cholesterol Cal: 19 mg/dL (ref 5–40)

## 2023-12-17 ENCOUNTER — Encounter: Payer: Self-pay | Admitting: Cardiovascular Disease

## 2023-12-17 ENCOUNTER — Ambulatory Visit (INDEPENDENT_AMBULATORY_CARE_PROVIDER_SITE_OTHER): Payer: Medicare Other | Admitting: Cardiovascular Disease

## 2023-12-17 VITALS — BP 108/77 | HR 85 | Ht 68.0 in | Wt 238.8 lb

## 2023-12-17 DIAGNOSIS — Z9861 Coronary angioplasty status: Secondary | ICD-10-CM

## 2023-12-17 DIAGNOSIS — I5032 Chronic diastolic (congestive) heart failure: Secondary | ICD-10-CM

## 2023-12-17 DIAGNOSIS — E782 Mixed hyperlipidemia: Secondary | ICD-10-CM | POA: Diagnosis not present

## 2023-12-17 DIAGNOSIS — I4811 Longstanding persistent atrial fibrillation: Secondary | ICD-10-CM

## 2023-12-17 DIAGNOSIS — I10 Essential (primary) hypertension: Secondary | ICD-10-CM

## 2023-12-17 DIAGNOSIS — I2510 Atherosclerotic heart disease of native coronary artery without angina pectoris: Secondary | ICD-10-CM

## 2023-12-17 NOTE — Progress Notes (Signed)
 Cardiology Office Note   Date:  12/17/2023   ID:  Roger [name], DOB 02/25/1946, MRN [id number]  PCP:  Roger Fredy RAMAN, MD  Cardiologist:  Denyse Fernand, MD      History of Present Illness: Roger [name] is a 78 y.o. male who presents for  Chief Complaint  Patient presents with   Follow-up    3 month follow up    Patient is feeling much better denies any chest pain or shortness of breath or dizziness.  Blood pressure appears to be stable.      Past Medical History:  Diagnosis Date   Anemia    Arthritis    Atrial fibrillation (HCC)    Bladder stone    Dysrhythmia    GERD (gastroesophageal reflux disease)    Hypertension      Past Surgical History:  Procedure Laterality Date   CATARACT EXTRACTION     CORONARY STENT INTERVENTION N/A 04/20/2019   Procedure: CORONARY STENT INTERVENTION;  Surgeon: Florencio Cara BIRCH, MD;  Location: [facility];  Service: Cardiovascular;  Laterality: N/A;   CYSTOSCOPY WITH LITHOLAPAXY N/A 10/06/2019   Procedure: CYSTOSCOPY WITH LITHOLAPAXY;  Surgeon: Francisca Redell BROCKS, MD;  Location: [facility];  Service: Urology;  Laterality: N/A;   HIP SURGERY     HOLEP-LASER ENUCLEATION OF THE PROSTATE WITH MORCELLATION N/A 10/06/2019   Procedure: HOLEP-LASER ENUCLEATION OF THE PROSTATE WITH MORCELLATION;  Surgeon: Francisca Redell BROCKS, MD;  Location: [facility];  Service: Urology;  Laterality: N/A;   JOINT REPLACEMENT Right 2012   hip right   LEFT HEART CATH AND CORONARY ANGIOGRAPHY Left 04/20/2019   Procedure: LEFT HEART CATH AND CORONARY ANGIOGRAPHY;  Surgeon: Roger Denyse LABOR, MD;  Location: [facility];  Service: Cardiovascular;  Laterality: Left;     Current Outpatient Medications  Medication Sig Dispense Refill   azelastine  (ASTELIN ) 0.1 % nasal spray Place 2 sprays into both nostrils 2 (two) times daily. 30 mL 3   Bempedoic Acid -Ezetimibe  (NEXLIZET) 180-10 MG TABS TAKE 1 TABLET BY MOUTH EVERY DAY 90 tablet 3   Cholecalciferol  (VITAMIN D3) 1.25 MG (50000 UT) CAPS Take 50,000 Units by mouth every Thursday.  (Patient not taking: Reported on 09/02/2023)     Cholecalciferol 25 MCG (1000 UT) tablet Take 1 tablet by mouth daily. (Patient not taking: Reported on 12/03/2023)     ELIQUIS  2.5 MG TABS tablet Take 1 tablet (2.5 mg total) by mouth 2 (two) times daily. 60 tablet 3   empagliflozin (JARDIANCE) 25 MG TABS tablet Take 1 tablet by mouth daily.     enalapril  (VASOTEC ) 10 MG tablet TAKE ONE-HALF TABLET BY MOUTH EVERY DAY HIGH BLOOD PRESSURE     ergocalciferol  (VITAMIN D2) 1.25 MG (50000 UT) capsule Take 50,000 Units by mouth once a week. Thursday (Patient not taking: Reported on 09/02/2023)     ezetimibe  (ZETIA ) 10 MG tablet Take 10 mg by mouth daily.     hydrocortisone  (ANUSOL -HC) 25 MG suppository Place 1 suppository (25 mg total) rectally 2 (two) times daily. 12 suppository 0   ketoconazole (NIZORAL) 2 % shampoo Apply topically.     ketotifen (ZADITOR) 0.025 % ophthalmic solution Place 1 drop into both eyes daily as needed (allergies).     metoprolol  succinate (TOPROL -XL) 50 MG 24 hr tablet Take 50 mg by mouth daily. Take with or immediately following a meal.     mupirocin ointment (BACTROBAN) 2 % Apply topically. (Patient not taking: Reported on 12/03/2023)  pantoprazole  (PROTONIX ) 40 MG tablet Take 1 tablet (40 mg total) by mouth daily. 30 tablet 0   REPATHA SURECLICK 140 MG/ML SOAJ ADMINISTER 1 INJECTION UNDER THE SKIN EVERY 2 WEEKS 2 mL 3   spironolactone  (ALDACTONE ) 25 MG tablet Take 12.5 mg by mouth daily.     Current Facility-Administered Medications  Medication Dose Route Frequency Provider Last Rate Last Admin   cyanocobalamin  (VITAMIN B12) injection 1,000 mcg  1,000 mcg Intramuscular Once Roger Fredy RAMAN, MD        Allergies:   Amoxicillin, Sulfa antibiotics, Sulfur dioxide, Penicillin g, Ibuprofen, Pravastatin, and Rosuvastatin    Social History:   reports that he has been smoking cigarettes. He has never  used smokeless tobacco. He reports current alcohol use. He reports that he does not use drugs.   Family History:  family history is not on file.    ROS:     Review of Systems  Constitutional: Negative.   HENT: Negative.    Eyes: Negative.   Respiratory: Negative.    Gastrointestinal: Negative.   Genitourinary: Negative.   Musculoskeletal: Negative.   Skin: Negative.   Neurological: Negative.   Endo/Heme/Allergies: Negative.   Psychiatric/Behavioral: Negative.    All other systems reviewed and are negative.     All other systems are reviewed and negative.    PHYSICAL EXAM: VS:  BP 108/77   Pulse 85   Ht 5' 8 (1.727 m)   Wt 238 lb 12.8 oz (108.3 kg)   SpO2 99%   BMI 36.31 kg/m  , BMI Body mass index is 36.31 kg/m. Last weight:  Wt Readings from Last 3 Encounters:  12/17/23 238 lb 12.8 oz (108.3 kg)  12/03/23 242 lb (109.8 kg)  09/17/23 238 lb 9.6 oz (108.2 kg)     Physical Exam Vitals reviewed.  Constitutional:      Appearance: Normal appearance. He is normal weight.  HENT:     Head: Normocephalic.     Nose: Nose normal.     Mouth/Throat:     Mouth: Mucous membranes are moist.  Eyes:     Pupils: Pupils are equal, round, and reactive to light.  Cardiovascular:     Rate and Rhythm: Normal rate and regular rhythm.     Pulses: Normal pulses.     Heart sounds: Normal heart sounds.  Pulmonary:     Effort: Pulmonary effort is normal.  Abdominal:     General: Abdomen is flat. Bowel sounds are normal.  Musculoskeletal:        General: Normal range of motion.     Cervical back: Normal range of motion.  Skin:    General: Skin is warm.  Neurological:     General: No focal deficit present.     Mental Status: He is alert.  Psychiatric:        Mood and Affect: Mood normal.       EKG:   Recent Labs: 12/03/2023: ALT 8; BUN 22; Creatinine, Ser 1.23; Hemoglobin 14.2; Platelets 250; Potassium 5.1; Sodium 142    Lipid Panel    Component Value Date/Time    CHOL 85 (L) 12/03/2023 1030   TRIG 100 12/03/2023 1030   HDL 50 12/03/2023 1030   LDLCALC 16 12/03/2023 1030      Other studies Reviewed: Additional studies/ records that were reviewed today include:  Review of the above records demonstrates:       No data to display  ASSESSMENT AND PLAN:    ICD-10-CM   1. CAD S/P percutaneous coronary angioplasty  I25.10    Z98.61    Patient denies any chest pain or shortness of breath    2. Mixed hyperlipidemia  E78.2     3. Essential hypertension, benign  I10    Blood pressure is low side of normal but has no dizziness continue current medications.    4. Chronic diastolic congestive heart failure (HCC)  I50.32     5. Longstanding persistent atrial fibrillation (HCC)  I48.11    Patient is asymptomatic from longstanding persistent A-fib.  Still in A-fib.  Patient is on stroke prevention.       Problem List Items Addressed This Visit       Cardiovascular and Mediastinum   Chronic diastolic congestive heart failure (HCC)   Longstanding persistent atrial fibrillation (HCC)   Essential hypertension, benign   CAD S/P percutaneous coronary angioplasty - Primary     Other   Mixed hyperlipidemia       Disposition:   Return in about 3 months (around 03/16/2024).    Total time spent: 40 minutes  Signed,  Denyse Bathe, MD  12/17/2023 11:08 AM    [facility]

## 2023-12-24 ENCOUNTER — Ambulatory Visit: Payer: Medicare Other | Admitting: Cardiovascular Disease

## 2024-02-01 ENCOUNTER — Other Ambulatory Visit: Payer: Self-pay | Admitting: Internal Medicine

## 2024-02-15 ENCOUNTER — Other Ambulatory Visit: Payer: Self-pay

## 2024-02-15 ENCOUNTER — Emergency Department

## 2024-02-15 ENCOUNTER — Inpatient Hospital Stay
Admission: EM | Admit: 2024-02-15 | Discharge: 2024-02-19 | DRG: 580 | Disposition: A | Discharge status: Skilled Nursing Facility | Attending: Internal Medicine | Admitting: Internal Medicine

## 2024-02-15 DIAGNOSIS — W1830XA Fall on same level, unspecified, initial encounter: Secondary | ICD-10-CM | POA: Diagnosis present

## 2024-02-15 DIAGNOSIS — I10 Essential (primary) hypertension: Secondary | ICD-10-CM | POA: Diagnosis present

## 2024-02-15 DIAGNOSIS — D62 Acute posthemorrhagic anemia: Secondary | ICD-10-CM | POA: Diagnosis not present

## 2024-02-15 DIAGNOSIS — E66811 Obesity, class 1: Secondary | ICD-10-CM | POA: Diagnosis present

## 2024-02-15 DIAGNOSIS — M25562 Pain in left knee: Secondary | ICD-10-CM | POA: Diagnosis present

## 2024-02-15 DIAGNOSIS — S8012XA Contusion of left lower leg, initial encounter: Secondary | ICD-10-CM | POA: Diagnosis present

## 2024-02-15 DIAGNOSIS — Z825 Family history of asthma and other chronic lower respiratory diseases: Secondary | ICD-10-CM

## 2024-02-15 DIAGNOSIS — E669 Obesity, unspecified: Secondary | ICD-10-CM | POA: Diagnosis present

## 2024-02-15 DIAGNOSIS — Z882 Allergy status to sulfonamides status: Secondary | ICD-10-CM

## 2024-02-15 DIAGNOSIS — Z880 Allergy status to penicillin: Secondary | ICD-10-CM

## 2024-02-15 DIAGNOSIS — Y92009 Unspecified place in unspecified non-institutional (private) residence as the place of occurrence of the external cause: Secondary | ICD-10-CM

## 2024-02-15 DIAGNOSIS — I482 Chronic atrial fibrillation, unspecified: Secondary | ICD-10-CM | POA: Diagnosis present

## 2024-02-15 DIAGNOSIS — M159 Polyosteoarthritis, unspecified: Secondary | ICD-10-CM | POA: Diagnosis present

## 2024-02-15 DIAGNOSIS — D649 Anemia, unspecified: Secondary | ICD-10-CM

## 2024-02-15 DIAGNOSIS — E119 Type 2 diabetes mellitus without complications: Secondary | ICD-10-CM | POA: Diagnosis present

## 2024-02-15 DIAGNOSIS — N179 Acute kidney failure, unspecified: Secondary | ICD-10-CM | POA: Diagnosis present

## 2024-02-15 DIAGNOSIS — T45515A Adverse effect of anticoagulants, initial encounter: Secondary | ICD-10-CM | POA: Diagnosis present

## 2024-02-15 DIAGNOSIS — Z7984 Long term (current) use of oral hypoglycemic drugs: Secondary | ICD-10-CM

## 2024-02-15 DIAGNOSIS — E538 Deficiency of other specified B group vitamins: Secondary | ICD-10-CM

## 2024-02-15 DIAGNOSIS — E785 Hyperlipidemia, unspecified: Secondary | ICD-10-CM | POA: Diagnosis present

## 2024-02-15 DIAGNOSIS — Z888 Allergy status to other drugs, medicaments and biological substances status: Secondary | ICD-10-CM

## 2024-02-15 DIAGNOSIS — S7012XA Contusion of left thigh, initial encounter: Principal | ICD-10-CM | POA: Diagnosis present

## 2024-02-15 DIAGNOSIS — Z7901 Long term (current) use of anticoagulants: Secondary | ICD-10-CM

## 2024-02-15 DIAGNOSIS — Z6835 Body mass index (BMI) 35.0-35.9, adult: Secondary | ICD-10-CM

## 2024-02-15 DIAGNOSIS — I2510 Atherosclerotic heart disease of native coronary artery without angina pectoris: Secondary | ICD-10-CM | POA: Diagnosis present

## 2024-02-15 DIAGNOSIS — E875 Hyperkalemia: Secondary | ICD-10-CM | POA: Diagnosis present

## 2024-02-15 DIAGNOSIS — M7989 Other specified soft tissue disorders: Secondary | ICD-10-CM | POA: Diagnosis present

## 2024-02-15 DIAGNOSIS — I252 Old myocardial infarction: Secondary | ICD-10-CM

## 2024-02-15 DIAGNOSIS — F17210 Nicotine dependence, cigarettes, uncomplicated: Secondary | ICD-10-CM | POA: Diagnosis present

## 2024-02-15 DIAGNOSIS — Z9861 Coronary angioplasty status: Secondary | ICD-10-CM

## 2024-02-15 DIAGNOSIS — K219 Gastro-esophageal reflux disease without esophagitis: Secondary | ICD-10-CM | POA: Diagnosis present

## 2024-02-15 DIAGNOSIS — I110 Hypertensive heart disease with heart failure: Secondary | ICD-10-CM | POA: Diagnosis present

## 2024-02-15 DIAGNOSIS — D6832 Hemorrhagic disorder due to extrinsic circulating anticoagulants: Secondary | ICD-10-CM | POA: Diagnosis present

## 2024-02-15 DIAGNOSIS — E1165 Type 2 diabetes mellitus with hyperglycemia: Secondary | ICD-10-CM

## 2024-02-15 DIAGNOSIS — W19XXXA Unspecified fall, initial encounter: Secondary | ICD-10-CM

## 2024-02-15 DIAGNOSIS — Z886 Allergy status to analgesic agent status: Secondary | ICD-10-CM

## 2024-02-15 DIAGNOSIS — Z79899 Other long term (current) drug therapy: Secondary | ICD-10-CM

## 2024-02-15 DIAGNOSIS — I5032 Chronic diastolic (congestive) heart failure: Secondary | ICD-10-CM | POA: Diagnosis present

## 2024-02-15 LAB — CBC WITH DIFFERENTIAL/PLATELET
Abs Immature Granulocytes: 0.06 10*3/uL (ref 0.00–0.07)
Basophils Absolute: 0.1 10*3/uL (ref 0.0–0.1)
Basophils Relative: 1 %
Eosinophils Absolute: 0 10*3/uL (ref 0.0–0.5)
Eosinophils Relative: 0 %
HCT: 30.5 % — ABNORMAL LOW (ref 39.0–52.0)
Hemoglobin: 9.7 g/dL — ABNORMAL LOW (ref 13.0–17.0)
Immature Granulocytes: 1 %
Lymphocytes Relative: 8 %
Lymphs Abs: 0.9 10*3/uL (ref 0.7–4.0)
MCH: 31.8 pg (ref 26.0–34.0)
MCHC: 31.8 g/dL (ref 30.0–36.0)
MCV: 100 fL (ref 80.0–100.0)
Monocytes Absolute: 0.9 10*3/uL (ref 0.1–1.0)
Monocytes Relative: 8 %
Neutro Abs: 8.8 10*3/uL — ABNORMAL HIGH (ref 1.7–7.7)
Neutrophils Relative %: 82 %
Platelets: 331 10*3/uL (ref 150–400)
RBC: 3.05 MIL/uL — ABNORMAL LOW (ref 4.22–5.81)
RDW: 15.1 % (ref 11.5–15.5)
WBC: 10.7 10*3/uL — ABNORMAL HIGH (ref 4.0–10.5)
nRBC: 0 % (ref 0.0–0.2)

## 2024-02-15 LAB — BASIC METABOLIC PANEL
Anion gap: 10 (ref 5–15)
BUN: 51 mg/dL — ABNORMAL HIGH (ref 8–23)
CO2: 20 mmol/L — ABNORMAL LOW (ref 22–32)
Calcium: 8.5 mg/dL — ABNORMAL LOW (ref 8.9–10.3)
Chloride: 109 mmol/L (ref 98–111)
Creatinine, Ser: 1.43 mg/dL — ABNORMAL HIGH (ref 0.61–1.24)
GFR, Estimated: 51 mL/min — ABNORMAL LOW (ref >60)
Glucose, Bld: 108 mg/dL — ABNORMAL HIGH (ref 70–99)
Potassium: 5.3 mmol/L — ABNORMAL HIGH (ref 3.5–5.1)
Sodium: 139 mmol/L (ref 135–145)

## 2024-02-15 NOTE — ED Provider Notes (Signed)
 [facility] Provider Note    Event Date/Time   First MD Initiated Contact with Patient 02/15/24 2018     (approximate)   History   Fall   HPI  Roger [name] is a 78 y.o. male with history of atrial fibrillation on Eliquis who presents with left leg pain and swelling after a fall 4 days ago.  The patient states that he had a mechanical fall from standing height onto his left knee.  He states that he wanted to get it checked out right away, but staff at his facility advised him that this was not necessary.  Since that time, he has had increased swelling especially to the lower part of the thigh and upper part of the knee, weeping from a large skin tear along the knee, as well as bruising and discoloration to the distal foot.  He denies any weakness or numbness in the leg.  He states that he has been able to walk and bear weight on the left leg since the fall.  He denies any other injuries, although he does have some pain across his lower back bilaterally but not in the midline.  I reviewed the past medical records.  The most recent prior encounter in our system is a cardiology visit with Dr. Lennette Bihari from 1/10 for follow-up of his chronic conditions.  He has no recent hospitalizations.   Physical Exam   Triage Vital Signs: ED Triage Vitals  Encounter Vitals Group     BP 02/15/24 1639 (!) 110/54     Systolic BP Percentile --      Diastolic BP Percentile --      Pulse Rate 02/15/24 1639 91     Resp 02/15/24 1639 18     Temp 02/15/24 1639 97.8 F (36.6 C)     Temp Source 02/15/24 1639 Oral     SpO2 02/15/24 1639 99 %     Weight --      Height --      Head Circumference --      Peak Flow --      Pain Score 02/15/24 1647 4     Pain Loc --      Pain Education --      Exclude from Growth Chart --     Most recent vital signs: Vitals:   02/15/24 1639  BP: (!) 110/54  Pulse: 91  Resp: 18  Temp: 97.8 F (36.6 C)  SpO2: 99%     General: Awake, no  distress.  CV:  Good peripheral perfusion.  Resp:  Normal effort.  Abd:  No distention.  Other:  Left distal thigh and knee with a large area of swelling and tenderness to the anterior/medial aspect.  Approximately 20 cm superficial skin tear along the medial and anterior aspect of the knee.  No erythema, induration, or abnormal warmth.  ROM of the knee limited due to pain and swelling.  Distal leg and foot with ecchymosis.  2+ DP pulse.  Normal cap refill.  Intact motor and sensory distally.  Full range of motion at the hip and ankle.   ED Results / Procedures / Treatments   Labs (all labs ordered are listed, but only abnormal results are displayed) Labs Reviewed  CBC WITH DIFFERENTIAL/PLATELET - Abnormal; Notable for the following components:      Result Value   WBC 10.7 (*)    RBC 3.05 (*)    Hemoglobin 9.7 (*)    HCT 30.5 (*)  Neutro Abs 8.8 (*)    All other components within normal limits  BASIC METABOLIC PANEL - Abnormal; Notable for the following components:   Potassium 5.3 (*)    CO2 20 (*)    Glucose, Bld 108 (*)    BUN 51 (*)    Creatinine, Ser 1.43 (*)    Calcium 8.5 (*)    GFR, Estimated 51 (*)    All other components within normal limits  BASIC METABOLIC PANEL     EKG     RADIOLOGY  XR L hip: I independently viewed and interpreted the images; there is no acute fracture  XR L knee: No acute fracture  CT L femur: Pending  PROCEDURES:  Critical Care performed: No  Procedures   MEDICATIONS ORDERED IN ED: Medications - No data to display   IMPRESSION / MDM / ASSESSMENT AND PLAN / ED COURSE  I reviewed the triage vital signs and the nursing notes.  78 year old male with PMH as noted above presents with a large area of swelling along the distal thigh and knee.  He also has a superficial skin tear over the knee, as well as some ecchymosis tracking into the distal leg and foot.  On exam his vital signs are normal.  The left lower extremity is  neuro/vascular intact.    Differential diagnosis includes, but is not limited to, hematoma, contusion, fracture, soft tissue injury.  There is no erythema, induration, or other findings to indicate cellulitis.  The extremity is neuro/vascular intact.  Given the location and significant size of the swelling,  it is not consistent with DVT.  X-rays of the hip and knee were obtained and are negative.  We will obtain basic labs and a CT for further evaluation.  Patient's presentation is most consistent with acute complicated illness / injury requiring diagnostic workup.  The patient is on the cardiac monitor to evaluate for evidence of arrhythmia and/or significant heart rate changes.  ----------------------------------------- 11:14 PM on 02/15/2024 -----------------------------------------  CT read is pending but by my wet read, there is a large hematoma in the area of concern.  CBC shows a drop in the hemoglobin to 9.7 from 14 several months ago.  This may not be directly related to the hematoma although based on this drop I feel that it will be best to admit the patient for serial CBCs and to make sure that the hematoma is not expanding.  The patient is in agreement with this plan.  He has been signed out to the oncoming ED physician pending CT.   FINAL CLINICAL IMPRESSION(S) / ED DIAGNOSES   Final diagnoses:  Hematoma of left thigh, initial encounter     Rx / DC Orders   ED Discharge Orders     None        Note:  This document was prepared using Dragon voice recognition software and may include unintentional dictation errors.    Dionne Bucy, MD 02/15/24 2315

## 2024-02-15 NOTE — ED Notes (Addendum)
 Pt reported fall and suffered injury to L leg around knee on Friday when he was walking down a concrete slope. Pt has significant swelling and bruising to the area. Pt has wheelchair that he has used since the fall for transportation now from his place of care ([facility]). Normally pt is able to ambulate fine without it. Pt endorses intermittent numbness and tingling. Pt denies pain in L knee but does endorse lower back pain that goes across both L and R sides. During physical exam, pt noted to have bruising to ankle. Pt PMS intact.  Past Medical History:  Diagnosis Date   Anemia    Arthritis    Atrial fibrillation (HCC)    Bladder stone    Dysrhythmia    GERD (gastroesophageal reflux disease)    Hypertension

## 2024-02-15 NOTE — ED Triage Notes (Signed)
 Patient states he fell Friday, bruising and swelling has increased to left leg.

## 2024-02-15 NOTE — ED Provider Notes (Signed)
11:20 PM  Assumed care at shift change.  Patient here with complaints of upper abdominal pain and vomiting for the past 5 days.  Has been taking Tylenol 1000 mg every 4 hours since her ED visit on 01/04/2023.  No fever.  LFTs significantly elevated today.  Normal lipase.  Will check Tylenol level, INR, ethanol.  Will also obtain MRI of the abdomen with MRCP to evaluate for pancreatic lesions, biliary obstruction, choledocholithiasis.  Right upper quadrant ultrasound unremarkable today.  My exam patient is tender throughout the upper abdomen, worse in the right upper quadrant with negative Murphy sign.  Nonperitoneal abdomen.  Afebrile.  Hemodynamically stable.  2:15 AM  Pt's 4-hour Tylenol level is 45.  Normal.  Ethanol level negative.  MRCP shows no acute abnormality when reviewed and interpreted by myself and the radiologist.  Discussed with Patty at [facility] control who recommends starting N-acetylcysteine for chronic Tylenol overdose given elevated liver function test.  Consulted and discussed patient's case with hospitalist, Dr. Damita Dunnings.  I have recommended admission and consulting physician agrees and will place admission orders.  Patient (and family if present) agree with this plan.   I reviewed all nursing notes, vitals, pertinent previous records.  All labs, EKGs, imaging ordered have been independently reviewed and interpreted by myself.   CRITICAL CARE Performed by: Pryor Curia   Total critical care time: 35 minutes  Critical care time was exclusive of separately billable procedures and treating other patients.  Critical care was necessary to treat or prevent imminent or life-threatening deterioration.  Critical care was time spent personally by me on the following activities: development of treatment plan with patient and/or surrogate as well as nursing, discussions with consultants, evaluation of patient's response to treatment, examination of patient, obtaining history from  patient or surrogate, ordering and performing treatments and interventions, ordering and review of laboratory studies, ordering and review of radiographic studies, pulse oximetry and re-evaluation of patient's condition.    Roger [name], Roger Bison, DO 01/10/23 [name], Roger Bison, DO 01/10/23 [id number]

## 2024-02-16 ENCOUNTER — Encounter: Payer: Self-pay | Admitting: Internal Medicine

## 2024-02-16 ENCOUNTER — Observation Stay

## 2024-02-16 DIAGNOSIS — E669 Obesity, unspecified: Secondary | ICD-10-CM | POA: Diagnosis present

## 2024-02-16 DIAGNOSIS — I5032 Chronic diastolic (congestive) heart failure: Secondary | ICD-10-CM

## 2024-02-16 DIAGNOSIS — I2510 Atherosclerotic heart disease of native coronary artery without angina pectoris: Secondary | ICD-10-CM | POA: Diagnosis present

## 2024-02-16 DIAGNOSIS — D62 Acute posthemorrhagic anemia: Secondary | ICD-10-CM | POA: Diagnosis present

## 2024-02-16 DIAGNOSIS — Y92009 Unspecified place in unspecified non-institutional (private) residence as the place of occurrence of the external cause: Secondary | ICD-10-CM | POA: Diagnosis not present

## 2024-02-16 DIAGNOSIS — S8002XA Contusion of left knee, initial encounter: Secondary | ICD-10-CM | POA: Diagnosis not present

## 2024-02-16 DIAGNOSIS — W19XXXA Unspecified fall, initial encounter: Secondary | ICD-10-CM

## 2024-02-16 DIAGNOSIS — Z7901 Long term (current) use of anticoagulants: Secondary | ICD-10-CM | POA: Diagnosis not present

## 2024-02-16 DIAGNOSIS — Z880 Allergy status to penicillin: Secondary | ICD-10-CM | POA: Diagnosis not present

## 2024-02-16 DIAGNOSIS — Z7984 Long term (current) use of oral hypoglycemic drugs: Secondary | ICD-10-CM | POA: Diagnosis not present

## 2024-02-16 DIAGNOSIS — I482 Chronic atrial fibrillation, unspecified: Secondary | ICD-10-CM | POA: Diagnosis present

## 2024-02-16 DIAGNOSIS — S7012XA Contusion of left thigh, initial encounter: Secondary | ICD-10-CM | POA: Diagnosis present

## 2024-02-16 DIAGNOSIS — Z9861 Coronary angioplasty status: Secondary | ICD-10-CM

## 2024-02-16 DIAGNOSIS — T45515A Adverse effect of anticoagulants, initial encounter: Secondary | ICD-10-CM | POA: Diagnosis present

## 2024-02-16 DIAGNOSIS — S8012XA Contusion of left lower leg, initial encounter: Secondary | ICD-10-CM | POA: Diagnosis not present

## 2024-02-16 DIAGNOSIS — E875 Hyperkalemia: Secondary | ICD-10-CM | POA: Diagnosis present

## 2024-02-16 DIAGNOSIS — N179 Acute kidney failure, unspecified: Secondary | ICD-10-CM | POA: Diagnosis present

## 2024-02-16 DIAGNOSIS — Z886 Allergy status to analgesic agent status: Secondary | ICD-10-CM | POA: Diagnosis not present

## 2024-02-16 DIAGNOSIS — W1830XA Fall on same level, unspecified, initial encounter: Secondary | ICD-10-CM | POA: Diagnosis present

## 2024-02-16 DIAGNOSIS — E119 Type 2 diabetes mellitus without complications: Secondary | ICD-10-CM | POA: Diagnosis present

## 2024-02-16 DIAGNOSIS — Z825 Family history of asthma and other chronic lower respiratory diseases: Secondary | ICD-10-CM | POA: Diagnosis not present

## 2024-02-16 DIAGNOSIS — E785 Hyperlipidemia, unspecified: Secondary | ICD-10-CM | POA: Diagnosis present

## 2024-02-16 DIAGNOSIS — E66811 Obesity, class 1: Secondary | ICD-10-CM | POA: Diagnosis present

## 2024-02-16 DIAGNOSIS — I10 Essential (primary) hypertension: Secondary | ICD-10-CM

## 2024-02-16 DIAGNOSIS — F17210 Nicotine dependence, cigarettes, uncomplicated: Secondary | ICD-10-CM | POA: Diagnosis present

## 2024-02-16 DIAGNOSIS — Z6835 Body mass index (BMI) 35.0-35.9, adult: Secondary | ICD-10-CM | POA: Diagnosis not present

## 2024-02-16 DIAGNOSIS — Z882 Allergy status to sulfonamides status: Secondary | ICD-10-CM | POA: Diagnosis not present

## 2024-02-16 DIAGNOSIS — I110 Hypertensive heart disease with heart failure: Secondary | ICD-10-CM | POA: Diagnosis present

## 2024-02-16 DIAGNOSIS — Z888 Allergy status to other drugs, medicaments and biological substances status: Secondary | ICD-10-CM | POA: Diagnosis not present

## 2024-02-16 DIAGNOSIS — D6832 Hemorrhagic disorder due to extrinsic circulating anticoagulants: Secondary | ICD-10-CM | POA: Diagnosis present

## 2024-02-16 LAB — CBC
HCT: 31.1 % — ABNORMAL LOW (ref 39.0–52.0)
HCT: 32.5 % — ABNORMAL LOW (ref 39.0–52.0)
Hemoglobin: 9.6 g/dL — ABNORMAL LOW (ref 13.0–17.0)
Hemoglobin: 10.1 g/dL — ABNORMAL LOW (ref 13.0–17.0)
MCH: 30.9 pg (ref 26.0–34.0)
MCH: 31.8 pg (ref 26.0–34.0)
MCHC: 30.9 g/dL (ref 30.0–36.0)
MCHC: 31.1 g/dL (ref 30.0–36.0)
MCV: 100 fL (ref 80.0–100.0)
MCV: 102.2 fL — ABNORMAL HIGH (ref 80.0–100.0)
Platelets: 304 10*3/uL (ref 150–400)
Platelets: 309 10*3/uL (ref 150–400)
RBC: 3.11 MIL/uL — ABNORMAL LOW (ref 4.22–5.81)
RBC: 3.18 MIL/uL — ABNORMAL LOW (ref 4.22–5.81)
RDW: 15 % (ref 11.5–15.5)
RDW: 14.8 % (ref 11.5–15.5)
WBC: 10.6 10*3/uL — ABNORMAL HIGH (ref 4.0–10.5)
WBC: 11.2 10*3/uL — ABNORMAL HIGH (ref 4.0–10.5)
nRBC: 0 % (ref 0.0–0.2)
nRBC: 0 % (ref 0.0–0.2)

## 2024-02-16 LAB — BASIC METABOLIC PANEL
Anion gap: 11 (ref 5–15)
BUN: 46 mg/dL — ABNORMAL HIGH (ref 8–23)
CO2: 22 mmol/L (ref 22–32)
Calcium: 8.9 mg/dL (ref 8.9–10.3)
Chloride: 109 mmol/L (ref 98–111)
Creatinine, Ser: 1.39 mg/dL — ABNORMAL HIGH (ref 0.61–1.24)
GFR, Estimated: 53 mL/min — ABNORMAL LOW (ref >60)
Glucose, Bld: 90 mg/dL (ref 70–99)
Potassium: 4.6 mmol/L (ref 3.5–5.1)
Sodium: 142 mmol/L (ref 135–145)

## 2024-02-16 LAB — PROTIME-INR
INR: 1.5 — ABNORMAL HIGH (ref 0.8–1.2)
Prothrombin Time: 17.9 s — ABNORMAL HIGH (ref 11.4–15.2)

## 2024-02-16 LAB — CBG MONITORING, ED
Glucose-Capillary: 73 mg/dL (ref 70–99)
Glucose-Capillary: 92 mg/dL (ref 70–99)
Glucose-Capillary: 65 mg/dL — ABNORMAL LOW (ref 70–99)

## 2024-02-16 LAB — APTT: aPTT: 42 s — ABNORMAL HIGH (ref 24–36)

## 2024-02-16 LAB — TYPE AND SCREEN
ABO/RH(D): O POS
Antibody Screen: NEGATIVE

## 2024-02-16 LAB — POTASSIUM: Potassium: 5 mmol/L (ref 3.5–5.1)

## 2024-02-16 LAB — GLUCOSE, CAPILLARY: Glucose-Capillary: 102 mg/dL — ABNORMAL HIGH (ref 70–99)

## 2024-02-16 LAB — BRAIN NATRIURETIC PEPTIDE: B Natriuretic Peptide: 126 pg/mL — ABNORMAL HIGH (ref 0.0–100.0)

## 2024-02-16 MED ORDER — INSULIN ASPART 100 UNIT/ML IJ SOLN
0.0000 [IU] | Freq: Three times a day (TID) | INTRAMUSCULAR | Status: DC
  Administered 2024-02-17: 1 [IU] via SUBCUTANEOUS
  Administered 2024-02-18: 2 [IU] via SUBCUTANEOUS
  Administered 2024-02-18 – 2024-02-19 (×2): 1 [IU] via SUBCUTANEOUS
  Filled 2024-02-16 (×4): qty 1

## 2024-02-16 MED ORDER — MORPHINE SULFATE (PF) 2 MG/ML IV SOLN: 2.0000 mg | INTRAVENOUS | Status: DC | PRN

## 2024-02-16 MED ORDER — PROTHROMBIN COMPLEX CONC HUMAN 500 UNITS IV KIT
5500.0000 [IU] | PACK | Status: AC
  Administered 2024-02-16: 5500 [IU] via INTRAVENOUS
  Filled 2024-02-16: qty 5000

## 2024-02-16 MED ORDER — NICOTINE 21 MG/24HR TD PT24: 21.0000 mg | MEDICATED_PATCH | Freq: Every day | TRANSDERMAL | Status: DC

## 2024-02-16 MED ORDER — METOPROLOL SUCCINATE ER 50 MG PO TB24
50.0000 mg | ORAL_TABLET | Freq: Every day | ORAL | Status: DC
  Administered 2024-02-16 – 2024-02-19 (×4): 50 mg via ORAL
  Filled 2024-02-16 (×4): qty 1

## 2024-02-16 MED ORDER — INSULIN ASPART 100 UNIT/ML IJ SOLN: 0.0000 [IU] | Freq: Every day | INTRAMUSCULAR | Status: DC

## 2024-02-16 MED ORDER — PROTHROMBIN COMPLEX CONC HUMAN 500 UNITS IV KIT: 5000.0000 [IU] | PACK | Status: DC

## 2024-02-16 MED ORDER — ONDANSETRON HCL 4 MG/2ML IJ SOLN
4.0000 mg | Freq: Three times a day (TID) | INTRAMUSCULAR | Status: DC | PRN
  Administered 2024-02-18: 4 mg via INTRAVENOUS

## 2024-02-16 MED ORDER — OXYCODONE-ACETAMINOPHEN 5-325 MG PO TABS: 1.0000 | ORAL_TABLET | ORAL | Status: DC | PRN

## 2024-02-16 MED ORDER — EZETIMIBE 10 MG PO TABS
10.0000 mg | ORAL_TABLET | Freq: Every day | ORAL | Status: DC
  Administered 2024-02-16 – 2024-02-19 (×4): 10 mg via ORAL
  Filled 2024-02-16 (×4): qty 1

## 2024-02-16 MED ORDER — ACETAMINOPHEN 325 MG PO TABS: 650.0000 mg | ORAL_TABLET | Freq: Four times a day (QID) | ORAL | Status: DC | PRN

## 2024-02-16 MED ORDER — HYDRALAZINE HCL 20 MG/ML IJ SOLN: 5.0000 mg | INTRAMUSCULAR | Status: DC | PRN

## 2024-02-16 MED ORDER — BEMPEDOIC ACID-EZETIMIBE 180-10 MG PO TABS: 1.0000 | ORAL_TABLET | Freq: Every day | ORAL | Status: DC

## 2024-02-16 MED ORDER — PANTOPRAZOLE SODIUM 40 MG PO TBEC
40.0000 mg | DELAYED_RELEASE_TABLET | Freq: Every day | ORAL | Status: DC
Start: 2024-02-16 — End: 2024-02-19
  Administered 2024-02-16 – 2024-02-19 (×4): 40 mg via ORAL
  Filled 2024-02-16 (×4): qty 1

## 2024-02-16 MED ORDER — PROTHROMBIN COMPLEX CONC HUMAN 500 UNITS IV KIT: 50.0000 [IU]/kg | PACK | INTRAVENOUS | Status: DC

## 2024-02-16 NOTE — Progress Notes (Signed)
 Nonbillable note Patient presented to the ER for evaluation of left knee pain and swelling.  He status post a mechanical fall from standing height landing on his left knee about 4 days prior to presentation.  Noted to have significant edema, ecchymosis and swelling of his left knee. Noted to have a drop in his hemoglobin from 14.2-9.6. He was on Eliquis and received Kcentra in the ER to reverse his anticoagulation. H&H remained stable Appreciate surgical input, patient may need evacuation of hematoma and will be followed closely Will consult PT

## 2024-02-16 NOTE — ED Notes (Signed)
 Pt ABCs intact. RR even and unlabored. Pt in NAD. Bed in lowest locked position. Call bell in reach. Denies needs at this time.

## 2024-02-16 NOTE — H&P (Signed)
 History and Physical    TEE RICHESON ONG:[id number] DOB: November 06, 1946 DOA: 02/15/2024  Referring MD/NP/PA:   PCP: Margaretann Loveless, MD   Patient coming from:  The patient is coming from home.     Chief Complaint: fall and left leg pain and swelling  HPI: Roger [name] is a 78 y.o. male with medical history significant of A fib on Eliquis, ventricular fibrillation, HTN, HLD, DM, CAD, dCHF, who presents with fall, left leg pain and swelling.  Patient states that he fell accidentally 4 days ago. The patient states that he had a mechanical fall from standing height onto his left knee. No LOC. No headache or neck pain. He developed swelling and left leg pain, which has been progressively worsening. He has bruise in left lower part of the thigh, upper part of the knee, ankle and foot, with weeping from a large skin tear along the knee. He has discoloration to the left foot. Per Dr. Elesa Massed of ED, pt has dopplerable pulse in left leg. He states that he has been able to walk and bear weight on the left leg since the fall.   Patient does not have chest pain, cough, SOB.  No nausea, vomiting, diarrhea or abdominal pain.  No symptoms of UTI.  He states that he took last dose Eliquis yesterday morning (02/15/24 morning).   Data reviewed independently and ED Course: pt was found to have Hgb dropped from 14.2 on 12/03/23 to 9.7, AKI with creatinine 1.43, BUN 51, GFR 51 (baseline creatinine 1.23 with a GFR> 60 on 12/03/2023), potassium 5.3 with hemolysis, pending repeated potassium, WBC 10.7.  Temperature normal, blood pressure 110/54, heart rate 91, 18, oxygen 99% on room air.  X-ray of the left knee and left hip negative for fracture. Pt is admitted to telemetry bed as inpatient.  Message sent to Dr. Everlene Farrier of surgery for consult.  CT of left femur showed large hematoma. 1. Distal thigh and knee subcutaneus soft tissue edema with anteromedial 12x3.5x12 cm hematoma. 2.  No acute displaced fracture or  dislocation.  CT of head and CT of neck: Negative for acute injury.  X-ray of left ankle and X-ray of left foot: Negative for fracture  EKG:  Not done in ED, will get one.   Review of Systems:   General: no fevers, chills, no body weight gain, has fatigue HEENT: no blurry vision, hearing changes or sore throat Respiratory: no dyspnea, coughing, wheezing CV: no chest pain, no palpitations GI: no nausea, vomiting, abdominal pain, diarrhea, constipation GU: no dysuria, burning on urination, increased urinary frequency, hematuria  Ext: has left leg pain and swelling Neuro: no unilateral weakness, numbness, or tingling, no vision change or hearing loss. Has fall. Skin: no rash, no skin tear. MSK: No muscle spasm, no deformity, no limitation of range of movement in spin Heme: No easy bruising.  Travel history: No recent long distant travel.   Allergy:  Allergies  Allergen Reactions   Amoxicillin Anaphylaxis    Did it involve swelling of the face/tongue/throat, SOB, or low BP? Yes Did it involve sudden or severe rash/hives, skin peeling, or any reaction on the inside of your mouth or nose? No Did you need to seek medical attention at a hospital or doctor's office? No When did it last happen?      10+ years If all above answers are "NO", may proceed with cephalosporin use.    Sulfa Antibiotics Anaphylaxis   Sulfur Dioxide Anaphylaxis   Penicillin G Swelling  Ibuprofen     Stomachache   Pravastatin Other (See Comments)    Irritable bowel   Rosuvastatin Other (See Comments)    Irritable bowel    Past Medical History:  Diagnosis Date   Anemia    Arthritis    Atrial fibrillation (HCC)    Bladder stone    Dysrhythmia    GERD (gastroesophageal reflux disease)    Hypertension     Past Surgical History:  Procedure Laterality Date   CATARACT EXTRACTION     CORONARY STENT INTERVENTION N/A 04/20/2019   Procedure: CORONARY STENT INTERVENTION;  Surgeon: Alwyn Pea, MD;   Location: [facility];  Service: Cardiovascular;  Laterality: N/A;   CYSTOSCOPY WITH LITHOLAPAXY N/A 10/06/2019   Procedure: CYSTOSCOPY WITH LITHOLAPAXY;  Surgeon: Sondra Come, MD;  Location: [facility] ORS;  Service: Urology;  Laterality: N/A;   HIP SURGERY     HOLEP-LASER ENUCLEATION OF THE PROSTATE WITH MORCELLATION N/A 10/06/2019   Procedure: HOLEP-LASER ENUCLEATION OF THE PROSTATE WITH MORCELLATION;  Surgeon: Sondra Come, MD;  Location: [facility] ORS;  Service: Urology;  Laterality: N/A;   JOINT REPLACEMENT Right 2012   hip right   LEFT HEART CATH AND CORONARY ANGIOGRAPHY Left 04/20/2019   Procedure: LEFT HEART CATH AND CORONARY ANGIOGRAPHY;  Surgeon: Laurier Nancy, MD;  Location: [facility] CV [facility];  Service: Cardiovascular;  Laterality: Left;    Social History:  reports that he has quit smoking. His smoking use included cigarettes. He has never used smokeless tobacco. He reports current alcohol use. He reports that he does not use drugs.  Family History:  Family History  Problem Relation Age of Onset   Dementia Mother    COPD Father      Prior to Admission medications   Medication Sig Start Date End Date Taking? Authorizing Provider  azelastine (ASTELIN) 0.1 % nasal spray Place 2 sprays into both nostrils 2 (two) times daily. 09/02/23   Margaretann Loveless, MD  Bempedoic Acid-Ezetimibe (NEXLIZET) 180-10 MG TABS TAKE 1 TABLET BY MOUTH EVERY DAY 11/29/23   Margaretann Loveless, MD  Cholecalciferol (VITAMIN D3) 1.25 MG (50000 UT) CAPS Take 50,000 Units by mouth every Thursday.  Patient not taking: Reported on 09/02/2023 05/02/19   [provider]  Cholecalciferol 25 MCG (1000 UT) tablet Take 1 tablet by mouth daily. Patient not taking: Reported on 12/03/2023 03/12/15   [provider]  ELIQUIS 2.5 MG TABS tablet Take 1 tablet (2.5 mg total) by mouth 2 (two) times daily. 09/17/23   Laurier Nancy, MD  empagliflozin (JARDIANCE) 25 MG TABS tablet Take 1 tablet by mouth  daily. 10/21/23   [provider]  enalapril (VASOTEC) 10 MG tablet TAKE ONE-HALF TABLET BY MOUTH EVERY DAY HIGH BLOOD PRESSURE 08/13/22   [provider]  ergocalciferol (VITAMIN D2) 1.25 MG (50000 UT) capsule Take 50,000 Units by mouth once a week. Thursday Patient not taking: Reported on 09/02/2023    [provider]  ezetimibe (ZETIA) 10 MG tablet Take 10 mg by mouth daily. 10/14/21   [provider]  hydrocortisone (ANUSOL-HC) 25 MG suppository Place 1 suppository (25 mg total) rectally 2 (two) times daily. 03/07/23   Chesley Noon, MD  ketoconazole (NIZORAL) 2 % shampoo Apply topically. 03/24/21   [provider]  ketotifen (ZADITOR) 0.025 % ophthalmic solution Place 1 drop into both eyes daily as needed (allergies).    [provider]  metoprolol succinate (TOPROL-XL) 50 MG 24 hr tablet Take 50  mg by mouth daily. Take with or immediately following a meal.    [provider]  mupirocin ointment (BACTROBAN) 2 % Apply topically. Patient not taking: Reported on 12/03/2023 11/06/20   [provider]  pantoprazole (PROTONIX) 40 MG tablet Take 1 tablet (40 mg total) by mouth daily. 04/21/19   Adrian Saran, MD  REPATHA SURECLICK 140 MG/ML SOAJ ADMINISTER 1 INJECTION UNDER THE SKIN EVERY 2 WEEKS 02/02/24   Scoggins, Hospital doctor, NP  spironolactone (ALDACTONE) 25 MG tablet Take 12.5 mg by mouth daily. 08/13/19   [provider]    Physical Exam: Vitals:   02/15/24 1639 02/16/24 0044 02/16/24 0107  BP: (!) 110/54 120/64   Pulse: 91 98   Resp: 18 18   Temp: 97.8 F (36.6 C) 98.1 F (36.7 C)   TempSrc: Oral Oral   SpO2: 99% 99%   Weight:   104.8 kg  Height:   5\' 8"  (1.727 m)   General: Not in acute distress HEENT:       Eyes: PERRL, EOMI, no jaundice       ENT: No discharge from the ears and nose, no pharynx injection, no tonsillar enlargement.        Neck: No JVD, no bruit, no mass felt. Heme: No neck lymph node  enlargement. Cardiac: S1/S2, RRR, No murmurs, No gallops or rubs. Respiratory: No rales, wheezing, rhonchi or rubs. GI: Soft, nondistended, nontender, no rebound pain, no organomegaly, BS present. GU: No hematuria Ext: has has skin tear in left leg, bruise, swelling, tenderness, and left leg. Has chronic venous changes in right lowe legs.    Musculoskeletal: No joint deformities, No joint redness or warmth, no limitation of ROM in spin. Skin: No rashes.  Neuro: Alert, oriented X3, cranial nerves II-XII grossly intact, moves all extremities. Psych: Patient is not psychotic, no suicidal or hemocidal ideation.  Labs on Admission: I have personally reviewed following labs and imaging studies  CBC: Recent Labs  Lab 02/15/24 2158 02/16/24 0035  WBC 10.7* 10.6*  NEUTROABS 8.8*  --   HGB 9.7* 9.6*  HCT 30.5* 31.1*  MCV 100.0 100.0  PLT 331 304   Basic Metabolic Panel: Recent Labs  Lab 02/15/24 2250 02/16/24 0027  NA 139  --   K 5.3* 5.0  CL 109  --   CO2 20*  --   GLUCOSE 108*  --   BUN 51*  --   CREATININE 1.43*  --   CALCIUM 8.5*  --    GFR: Estimated Creatinine Clearance: 52.4 mL/min (A) (by C-G formula based on SCr of 1.43 mg/dL (H)). Liver Function Tests: No results for input(s): "AST", "ALT", "ALKPHOS", "BILITOT", "PROT", "ALBUMIN" in the last 168 hours. No results for input(s): "LIPASE", "AMYLASE" in the last 168 hours. No results for input(s): "AMMONIA" in the last 168 hours. Coagulation Profile: Recent Labs  Lab 02/16/24 0027  INR 1.5*   Cardiac Enzymes: No results for input(s): "CKTOTAL", "CKMB", "CKMBINDEX", "TROPONINI" in the last 168 hours. BNP (last 3 results) No results for input(s): "PROBNP" in the last 8760 hours. HbA1C: No results for input(s): "HGBA1C" in the last 72 hours. CBG: No results for input(s): "GLUCAP" in the last 168 hours. Lipid Profile: No results for input(s): "CHOL", "HDL", "LDLCALC", "TRIG", "CHOLHDL", "LDLDIRECT" in the last  72 hours. Thyroid Function Tests: No results for input(s): "TSH", "T4TOTAL", "FREET4", "T3FREE", "THYROIDAB" in the last 72 hours. Anemia Panel: No results for input(s): "VITAMINB12", "FOLATE", "FERRITIN", "TIBC", "IRON", "RETICCTPCT" in the last 72  hours. Urine analysis:    Component Value Date/Time   COLORURINE RED (A) 10/21/2019 2122   APPEARANCEUR Cloudy (A) 10/24/2019 1337   LABSPEC 1.020 10/21/2019 2122   PHURINE  10/21/2019 2122    TEST NOT REPORTED DUE TO COLOR INTERFERENCE OF URINE PIGMENT   GLUCOSEU Negative 10/24/2019 1337   HGBUR (A) 10/21/2019 2122    TEST NOT REPORTED DUE TO COLOR INTERFERENCE OF URINE PIGMENT   BILIRUBINUR Negative 10/24/2019 1337   KETONESUR (A) 10/21/2019 2122    TEST NOT REPORTED DUE TO COLOR INTERFERENCE OF URINE PIGMENT   PROTEINUR 3+ (A) 10/24/2019 1337   PROTEINUR (A) 10/21/2019 2122    TEST NOT REPORTED DUE TO COLOR INTERFERENCE OF URINE PIGMENT   NITRITE Negative 10/24/2019 1337   NITRITE (A) 10/21/2019 2122    TEST NOT REPORTED DUE TO COLOR INTERFERENCE OF URINE PIGMENT   LEUKOCYTESUR Trace (A) 10/24/2019 1337   LEUKOCYTESUR (A) 10/21/2019 2122    TEST NOT REPORTED DUE TO COLOR INTERFERENCE OF URINE PIGMENT   Sepsis Labs: @LABRCNTIP (procalcitonin:4,lacticidven:4) )No results found for this or any previous visit (from the past 240 hours).   Radiological Exams on Admission:   Assessment/Plan Principal Problem:   Acute blood loss anemia Active Problems:   Hematoma of leg, left, initial encounter   Fall at home, initial encounter   Atrial fibrillation, chronic (HCC)   CAD S/P percutaneous coronary angioplasty   Diabetes mellitus without complication (HCC)   Chronic diastolic congestive heart failure (HCC)   Essential hypertension, benign   HLD (hyperlipidemia)   Hyperkalemia   AKI (acute kidney injury) (HCC)   Obesity (BMI 30-39.9)   Assessment and Plan:  Acute blood loss anemia due to hematoma of leg, left, initial  encounter: Hgb dropped from 14.2 to 9.7.  Currently hemodynamically stable.  -will admit to tele bed as inpt -reverse Eliquis: Kcentra is ordered by EDP -INR/PTT/type screen -Check CBC every 6 hour, goal for transfusion is hemoglobin<7.0  Fall at home, initial encounter -PT/OT when patient is able to (not ordered yet) -Fall precaution -Wound care consult for skin tear in left leg  Atrial fibrillation, chronic (HCC) -Hold Eliquis -Metoprolol  CAD S/P percutaneous coronary angioplasty: No chest pain -pt is on Nexlizet and Repatha  Diabetes mellitus without complication (HCC): Recent A1c 6.9, well-controlled.  Patient taking Jardiance -Sliding scale insulin  Chronic diastolic congestive heart failure (HCC): 2D echo on 04/19/2019 showed EF of 50-55%.  Patient does not have shortness of breath.  BNP is slightly elevated at 126, CHF seem to be compensated. -Hold spironolactone since patient at high risk of developing hypotension if has worsening bleeding  Essential hypertension, benign -IV hydralazine as needed -Hold enalapril and spironolactone to avoid hypotension and also because of AKI -Continue metoprolol for A-fib  HLD (hyperlipidemia) -Repatha and Nexlizet  Hyperkalemia: Potassium 5.3, likely due to hemolysis. -Repeat potassium level  AKI (acute kidney injury) (HCC): -Hold spironolactone and enalapril -Follow-up with BMP  Obesity (BMI 30-39.9): Body weight while 4.8 kg, BMI 35.14 -Encourage losing weight -Exercise and healthy diet       DVT ppx: SCD only to right leg  Code Status: Full code    Family Communication:     not done, no family member is at bed side.      Disposition Plan:  Anticipate discharge back to previous environment  Consults called: Message sent to Dr. Everlene Farrier of surgery for consult.  Admission status and Level of care: Telemetry Medical:    for obs as  inpt        Dispo: The patient is from: Home              Anticipated d/c is to:  Home              Anticipated d/c date is: 1 day              Patient currently is not medically stable to d/c.    Severity of Illness:  The appropriate patient status for this patient is INPATIENT. Inpatient status is judged to be reasonable and necessary in order to provide the required intensity of service to ensure the patient's safety. The patient's presenting symptoms, physical exam findings, and initial radiographic and laboratory data in the context of their chronic comorbidities is felt to place them at high risk for further clinical deterioration. Furthermore, it is not anticipated that the patient will be medically stable for discharge from the hospital within 2 midnights of admission.   * I certify that at the point of admission it is my clinical judgment that the patient will require inpatient hospital care spanning beyond 2 midnights from the point of admission due to high intensity of service, high risk for further deterioration and high frequency of surveillance required.*       Date of Service 02/16/2024    Lorretta Harp [facility]   If 7PM-7AM, please contact night-coverage [email or web address] 02/16/2024, 2:26 AM

## 2024-02-16 NOTE — ED Notes (Signed)
 Pt was given a sandwich and an applejuice for his BG. Permission granted from MD as diet order was modified. Pt remains asymptomatic.

## 2024-02-16 NOTE — ED Notes (Signed)
 Pt dried gauze on wound removed and replaced with new Vaseline Gauze and wrapped with ace bandage. Pt tolerated well. Pt ABCs intact. RR even and unlabored. Pt in NAD. Bed in lowest locked position. Call bell in reach. Denies needs at this time.

## 2024-02-16 NOTE — Consult Note (Signed)
 [facility] SURGICAL CONSULTATION NOTE (initial) - cpt: 09811  HISTORY OF PRESENT ILLNESS (HPI):  78 y.o. male presented to [facility] ED yesterday for evaluation of mechanical fall and left knee pain. Patient reports on Friday last week, he had a mechanical trip and fall while in the parking lot at his ALF. He landed on his left knee. He denied any head injuries or LOC. Since the fall, he has had progressive swelling and ecchymosis to the left medial knee extending down his left lower extremity as well. He denied any weakness, numbness, sensation loss, or strength changes. He does report ambulating independently at baseline. He tried a walker/cane in the past but felt like he was tripping over this. Of note, he is on Eliquis secondary to atrial fibrillation. Work up in the ED revealed a mild leukocytosis to 10.7K, Hgb to 9.7 (which was 14.2 two months prior, recheck this AM was stable at 9.6), slight AKI with sCr - 1.43 (baseline appears to be 1.2), hyperkalemia to 5.3, most recent INR 1.5, ptt 17.9. He did have a CT left femur which was concerning for large left medial thigh/knee hematoma. He has remained neurovascularly intact distally. Additional trauma imaging without injury. He was admitted to the medicine service. Eliquis was reversed.   Surgery is consulted by hospitalist physician Dr. Lorretta Harp, MD in this context for evaluation and management of left thigh/knee hematoma  PAST MEDICAL HISTORY (PMH):  Past Medical History:  Diagnosis Date   Anemia    Arthritis    Atrial fibrillation (HCC)    Bladder stone    Dysrhythmia    GERD (gastroesophageal reflux disease)    Hypertension      PAST SURGICAL HISTORY (PSH):  Past Surgical History:  Procedure Laterality Date   CATARACT EXTRACTION     CORONARY STENT INTERVENTION N/A 04/20/2019   Procedure: CORONARY STENT INTERVENTION;  Surgeon: Alwyn Pea, MD;  Location: [facility] INVASIVE CV LAB;  Service: Cardiovascular;  Laterality:  N/A;   CYSTOSCOPY WITH LITHOLAPAXY N/A 10/06/2019   Procedure: CYSTOSCOPY WITH LITHOLAPAXY;  Surgeon: Sondra Come, MD;  Location: [facility] ORS;  Service: Urology;  Laterality: N/A;   HIP SURGERY     HOLEP-LASER ENUCLEATION OF THE PROSTATE WITH MORCELLATION N/A 10/06/2019   Procedure: HOLEP-LASER ENUCLEATION OF THE PROSTATE WITH MORCELLATION;  Surgeon: Sondra Come, MD;  Location: [facility] ORS;  Service: Urology;  Laterality: N/A;   JOINT REPLACEMENT Right 2012   hip right   LEFT HEART CATH AND CORONARY ANGIOGRAPHY Left 04/20/2019   Procedure: LEFT HEART CATH AND CORONARY ANGIOGRAPHY;  Surgeon: Laurier Nancy, MD;  Location: [facility] INVASIVE CV LAB;  Service: Cardiovascular;  Laterality: Left;     MEDICATIONS:  Prior to Admission medications   Medication Sig Start Date End Date Taking? Authorizing Provider  azelastine (ASTELIN) 0.1 % nasal spray Place 2 sprays into both nostrils 2 (two) times daily. 09/02/23  Yes Margaretann Loveless, MD  Bempedoic Acid-Ezetimibe (NEXLIZET) 180-10 MG TABS TAKE 1 TABLET BY MOUTH EVERY DAY 11/29/23  Yes Margaretann Loveless, MD  Cholecalciferol 25 MCG (1000 UT) tablet Take 1 tablet by mouth daily. 03/12/15  Yes [provider]  ELIQUIS 2.5 MG TABS tablet Take 1 tablet (2.5 mg total) by mouth 2 (two) times daily. 09/17/23  Yes Laurier Nancy, MD  empagliflozin (JARDIANCE) 25 MG TABS tablet Take 1 tablet by mouth daily. 10/21/23  Yes [provider]  enalapril (VASOTEC) 10 MG tablet TAKE ONE-HALF TABLET BY MOUTH EVERY  DAY HIGH BLOOD PRESSURE 08/13/22  Yes [provider]  ergocalciferol (VITAMIN D2) 1.25 MG (50000 UT) capsule Take 50,000 Units by mouth once a week. Thursday   Yes [provider]  hydrocortisone (ANUSOL-HC) 25 MG suppository Place 1 suppository (25 mg total) rectally 2 (two) times daily. 03/07/23  Yes Chesley Noon, MD  ketoconazole (NIZORAL) 2 % shampoo Apply topically. 03/24/21  Yes [provider]  ketotifen (ZADITOR)  0.025 % ophthalmic solution Place 1 drop into both eyes daily as needed (allergies).   Yes [provider]  metoprolol succinate (TOPROL-XL) 50 MG 24 hr tablet Take 50 mg by mouth daily. Take with or immediately following a meal.   Yes [provider]  pantoprazole (PROTONIX) 40 MG tablet Take 1 tablet (40 mg total) by mouth daily. 04/21/19  Yes Adrian Saran, MD  REPATHA SURECLICK 140 MG/ML SOAJ ADMINISTER 1 INJECTION UNDER THE SKIN EVERY 2 WEEKS 02/02/24  Yes Scoggins, Amber, NP  spironolactone (ALDACTONE) 25 MG tablet Take 12.5 mg by mouth daily. 08/13/19  Yes [provider]  Cholecalciferol (VITAMIN D3) 1.25 MG (50000 UT) CAPS Take 50,000 Units by mouth every Thursday.  Patient not taking: Reported on 09/02/2023 05/02/19   [provider]  ezetimibe (ZETIA) 10 MG tablet Take 10 mg by mouth daily. 10/14/21   [provider]  mupirocin ointment (BACTROBAN) 2 % Apply topically. Patient not taking: Reported on 09/02/2023 11/06/20   [provider]     ALLERGIES:  Allergies  Allergen Reactions   Amoxicillin Anaphylaxis    Did it involve swelling of the face/tongue/throat, SOB, or low BP? Yes Did it involve sudden or severe rash/hives, skin peeling, or any reaction on the inside of your mouth or nose? No Did you need to seek medical attention at a hospital or doctor's office? No When did it last happen?      10+ years If all above answers are "NO", may proceed with cephalosporin use.    Sulfa Antibiotics Anaphylaxis   Sulfur Dioxide Anaphylaxis   Penicillin G Swelling   Ibuprofen     Stomachache   Pravastatin Other (See Comments)    Irritable bowel   Rosuvastatin Other (See Comments)    Irritable bowel     SOCIAL HISTORY:  Social History   Socioeconomic History   Marital status: Single    Spouse name: Not on file   Number of children: Not on file   Years of education: Not on file   Highest education level: Not on file  Occupational  History   Not on file  Tobacco Use   Smoking status: Former    Current packs/day: 0.50    Types: Cigarettes   Smokeless tobacco: Never  Vaping Use   Vaping status: Never Used  Substance and Sexual Activity   Alcohol use: Yes    Comment: rare   Drug use: No   Sexual activity: Not on file  Other Topics Concern   Not on file  Social History Narrative   Not on file   Social Drivers of Health   Financial Resource Strain: Not on file  Food Insecurity: No Food Insecurity (09/09/2022)   Hunger Vital Sign    Worried About Running Out of Food in the Last Year: Never true    Ran Out of Food in the Last Year: Never true  Transportation Needs: No Transportation Needs (09/09/2022)   PRAPARE - Administrator, Civil Service (Medical): No    Lack of Transportation (  Non-Medical): No  Physical Activity: Not on file  Stress: Not on file  Social Connections: Not on file  Intimate Partner Violence: Not on file     FAMILY HISTORY:  Family History  Problem Relation Age of Onset   Dementia Mother    COPD Father       REVIEW OF SYSTEMS:  Review of Systems  Constitutional:  Negative for chills and fever.  Respiratory:  Negative for cough and shortness of breath.   Cardiovascular:  Negative for chest pain and palpitations.  Gastrointestinal:  Negative for nausea and vomiting.  Genitourinary:  Negative for dysuria and urgency.  Musculoskeletal:  Positive for falls and joint pain.  All other systems reviewed and are negative.   VITAL SIGNS:  Temp:  [97.7 F (36.5 C)-98.1 F (36.7 C)] 97.7 F (36.5 C) (03/12 0428) Pulse Rate:  [91-99] 99 (03/12 0428) Resp:  [18-20] 20 (03/12 0428) BP: (110-152)/(54-128) 152/128 (03/12 0428) SpO2:  [97 %-99 %] 97 % (03/12 0428) Weight:  [104.8 kg] 104.8 kg (03/12 0107)     Height: 5\' 8"  (172.7 cm) Weight: 104.8 kg BMI (Calculated): 35.15   INTAKE/OUTPUT:  03/11 0701 - 03/12 0700 In: 220 [IV Piggyback:220] Out: -   PHYSICAL EXAM:   Physical Exam Vitals and nursing note reviewed. Exam conducted with a chaperone present.  Constitutional:      General: He is not in acute distress.    Appearance: Normal appearance. He is obese. He is not ill-appearing.     Comments: Resting in bed; NAD  HENT:     Head: Normocephalic and atraumatic.  Eyes:     General: No scleral icterus.    Conjunctiva/sclera: Conjunctivae normal.  Cardiovascular:     Rate and Rhythm: Normal rate. Rhythm irregularly irregular.  Pulmonary:     Effort: Pulmonary effort is normal. No respiratory distress.  Genitourinary:    Comments: Deferred Musculoskeletal:     Right lower leg: Edema present.     Left lower leg: Edema present.     Comments: To the medial left knee there is a large area of significant ecchymosis with overlaying skin tears and blistering. There is also ecchymosis to the left ankle. 2+ pitting edema to the left lower extremity. Pulses 2+ distally.   Skin:    General: Skin is warm and dry.     Findings: Bruising present.     Comments: Skin tear and blistering to skin overlaying hematoma to left medial knee/thigh   Neurological:     General: No focal deficit present.     Mental Status: He is alert and oriented to person, place, and time.  Psychiatric:        Mood and Affect: Mood normal.        Behavior: Behavior normal.    Left Knee (02/16/2024):     Labs:     Latest Ref Rng & Units 02/16/2024   12:35 AM 02/15/2024    9:58 PM 12/03/2023   10:30 AM  CBC  WBC 4.0 - 10.5 K/uL 10.6  10.7  8.3   Hemoglobin 13.0 - 17.0 g/dL 9.6  9.7  16.1   Hematocrit 39.0 - 52.0 % 31.1  30.5  42.0   Platelets 150 - 400 K/uL 304  331  250       Latest Ref Rng & Units 02/16/2024   12:27 AM 02/15/2024   10:50 PM 12/03/2023   10:30 AM  CMP  Glucose 70 - 99 mg/dL  096  045  BUN 8 - 23 mg/dL  51  22   Creatinine 6.57 - 1.24 mg/dL  8.46  9.62   Sodium 952 - 145 mmol/L  139  142   Potassium 3.5 - 5.1 mmol/L 5.0  5.3  5.1   Chloride 98 - 111  mmol/L  109  103   CO2 22 - 32 mmol/L  20  25   Calcium 8.9 - 10.3 mg/dL  8.5  9.2   Total Protein 6.0 - 8.5 g/dL   6.0   Total Bilirubin 0.0 - 1.2 mg/dL   0.9   Alkaline Phos 44 - 121 IU/L   63   AST 0 - 40 IU/L   15   ALT 0 - 44 IU/L   8      Imaging studies:   CT Left Femur (02/15/2024) personally reviewed with noted hematoma, and radiologist report reviewed below: IMPRESSION: 1. Distal thigh and knee subcutaneus soft tissue edema with anteromedial 12x3.5x12 cm hematoma. 2.  No acute displaced fracture or dislocation.   Assessment/Plan:  78 y.o. male with left knee/thigh hematoma s/p mechanical fall 4 days ago, complicated by pertinent comorbidities including atrial fibrillation on Eliquis.   - H&H stable in the last 24 hours, so concern for active bleeding is certainly lower. He does have significant ecchymosis to this left knee and distally in that extremity which likely explains Hgb changes from 2 months ago. No evidence that this is acutely infected either. However, this is quite large with overlaying skin changes and blistering. I am worried about the size of this hematoma and this may need evacuated.   - Agree with Eliquis reversal; H&H stable - Okay to bear weight as tolerated; okay to work with therapies  - Further management per primary service; we will follow   All of the above findings and recommendations were discussed with the patient, and all of patient's questions were answered to his expressed satisfaction.  Thank you for the opportunity to participate in this patient's care.   -- Lynden Oxford, PA-C [facility] 02/16/2024, 7:18 AM M-F: 7am - 4pm

## 2024-02-16 NOTE — Consult Note (Signed)
 WOC Nurse Consult Note: patient had a fall 4 days ago, presented to ED with L knee edema and superficial skin tear per MD notes  Reason for Consult: L leg skin tear  Wound type: partial thickness r/t fall  Pressure Injury POA: NA  Measurement: approximately 20 cms in length medial and anterior aspect of knee per MD note  Wound bed: unable to visualize as photo has dressing in place  Drainage (amount, consistency, odor)  see nursing flowsheet  Periwound: edema, hematoma per CT report  Dressing procedure/placement/frequency: Cleanse L leg/knee skin tear with NS, apply Xeroform gauze [place] [phone]) to wound bed daily, cover with Telfa nonstick dressing and ABD pad.  Secure with Kerlix roll gauze and Ace bandage for light compression.   POC discussed with bedside nurse. WOC team will not follow. Re-consult if further needs arise.   Thank you,    Priscella Mann MSN, RN-BC, [employer] [phone]

## 2024-02-16 NOTE — ED Notes (Signed)
 Pt transported to new room with all belongings and personal wheelchair. Pt ABCs intact. RR even and unlabored. Pt in NAD. Bed in lowest locked position. Call bell in reach. Denies needs at this time.

## 2024-02-17 DIAGNOSIS — S8002XA Contusion of left knee, initial encounter: Secondary | ICD-10-CM | POA: Diagnosis not present

## 2024-02-17 DIAGNOSIS — D62 Acute posthemorrhagic anemia: Secondary | ICD-10-CM | POA: Diagnosis not present

## 2024-02-17 DIAGNOSIS — W19XXXA Unspecified fall, initial encounter: Secondary | ICD-10-CM | POA: Diagnosis not present

## 2024-02-17 DIAGNOSIS — S7012XA Contusion of left thigh, initial encounter: Secondary | ICD-10-CM | POA: Diagnosis not present

## 2024-02-17 LAB — CBC
HCT: 29.6 % — ABNORMAL LOW (ref 39.0–52.0)
HCT: 29.7 % — ABNORMAL LOW (ref 39.0–52.0)
HCT: 30.8 % — ABNORMAL LOW (ref 39.0–52.0)
Hemoglobin: 9.6 g/dL — ABNORMAL LOW (ref 13.0–17.0)
Hemoglobin: 9.7 g/dL — ABNORMAL LOW (ref 13.0–17.0)
Hemoglobin: 9.7 g/dL — ABNORMAL LOW (ref 13.0–17.0)
MCH: 31.5 pg (ref 26.0–34.0)
MCH: 32.2 pg (ref 26.0–34.0)
MCH: 32.5 pg (ref 26.0–34.0)
MCHC: 31.5 g/dL (ref 30.0–36.0)
MCHC: 32.4 g/dL (ref 30.0–36.0)
MCHC: 32.7 g/dL (ref 30.0–36.0)
MCV: 100 fL (ref 80.0–100.0)
MCV: 100.3 fL — ABNORMAL HIGH (ref 80.0–100.0)
MCV: 98.7 fL (ref 80.0–100.0)
Platelets: 279 10*3/uL (ref 150–400)
Platelets: 290 10*3/uL (ref 150–400)
Platelets: 299 10*3/uL (ref 150–400)
RBC: 2.95 MIL/uL — ABNORMAL LOW (ref 4.22–5.81)
RBC: 3.01 MIL/uL — ABNORMAL LOW (ref 4.22–5.81)
RBC: 3.08 MIL/uL — ABNORMAL LOW (ref 4.22–5.81)
RDW: 14.8 % (ref 11.5–15.5)
RDW: 14.9 % (ref 11.5–15.5)
RDW: 15 % (ref 11.5–15.5)
WBC: 10.5 10*3/uL (ref 4.0–10.5)
WBC: 10.7 10*3/uL — ABNORMAL HIGH (ref 4.0–10.5)
WBC: 11 10*3/uL — ABNORMAL HIGH (ref 4.0–10.5)
nRBC: 0 % (ref 0.0–0.2)
nRBC: 0 % (ref 0.0–0.2)
nRBC: 0.2 % (ref 0.0–0.2)

## 2024-02-17 LAB — GLUCOSE, CAPILLARY
Glucose-Capillary: 106 mg/dL — ABNORMAL HIGH (ref 70–99)
Glucose-Capillary: 145 mg/dL — ABNORMAL HIGH (ref 70–99)
Glucose-Capillary: 104 mg/dL — ABNORMAL HIGH (ref 70–99)
Glucose-Capillary: 133 mg/dL — ABNORMAL HIGH (ref 70–99)

## 2024-02-17 LAB — BASIC METABOLIC PANEL
Anion gap: 8 (ref 5–15)
BUN: 35 mg/dL — ABNORMAL HIGH (ref 8–23)
CO2: 23 mmol/L (ref 22–32)
Calcium: 8.8 mg/dL — ABNORMAL LOW (ref 8.9–10.3)
Chloride: 111 mmol/L (ref 98–111)
Creatinine, Ser: 1.27 mg/dL — ABNORMAL HIGH (ref 0.61–1.24)
GFR, Estimated: 59 mL/min — ABNORMAL LOW (ref >60)
Glucose, Bld: 125 mg/dL — ABNORMAL HIGH (ref 70–99)
Potassium: 4.4 mmol/L (ref 3.5–5.1)
Sodium: 142 mmol/L (ref 135–145)

## 2024-02-17 NOTE — Progress Notes (Signed)
 [facility] SURGICAL PROGRESS NOTE (cpt [id number])  Hospital Day(s): 1.   Interval History: Patient seen and examined, no acute events or new complaints overnight. Patient reports he is doing well; no complaints. Only notes that his left leg will shake when it gets cold. Left knee is understandably sore. No weakness or sensation loss in the extremity. No fever. Previous leukocytosis is resolved; WBC 10.5K. Hgb to 9.7 which is stable. sCr improving; 1.27. No electrolyte derangements.   Review of Systems:  Constitutional: denies fever, chills  HEENT: denies cough or congestion  Respiratory: denies any shortness of breath  Cardiovascular: denies chest pain or palpitations  Genitourinary: denies burning with urination or urinary frequency Musculoskeletal: + fall, + left knee pain Integumentary: + ecchymosis  Vital signs in last 24 hours: [min-max] current  Temp:  [97.6 F (36.4 C)-98.6 F (37 C)] 98.2 F (36.8 C) (03/13 0733) Pulse Rate:  [84-101] 84 (03/13 0733) Resp:  [17-31] 18 (03/13 0733) BP: (90-137)/(39-84) 111/59 (03/13 0733) SpO2:  [93 %-100 %] 98 % (03/13 0733) Weight:  [100.2 kg-104.4 kg] 102.9 kg (03/13 0625)     Height: 5\' 8"  (172.7 cm) Weight: 102.9 kg BMI (Calculated): 34.5   Intake/Output last 2 shifts:  03/12 0701 - 03/13 0700 In: 240 [P.O.:240] Out: 500 [Urine:500]   Physical Exam:  Constitutional: alert, cooperative and no distress  HENT: normocephalic without obvious abnormality  Eyes: PERRL, EOM's grossly intact and symmetric  Respiratory: breathing non-labored at rest  Cardiovascular: regular rate and sinus rhythm  Integumentary / Musculoskeletal: Still with continued ecchymosis to the left anteromedial knee, this is soft and expectedly tender, there are overlaying skin tears, blistering improved, he does have 2+ edema to the shin on the left, left ankle also ecchymotic which is stable. His motor strength and sensation appear intact without  deficits.    Labs:     Latest Ref Rng & Units 02/17/2024    6:37 AM 02/17/2024   12:18 AM 02/16/2024   12:32 PM  CBC  WBC 4.0 - 10.5 K/uL 10.5  11.0  11.2   Hemoglobin 13.0 - 17.0 g/dL 9.7  9.6  69.6   Hematocrit 39.0 - 52.0 % 29.7  29.6  32.5   Platelets 150 - 400 K/uL 299  279  309       Latest Ref Rng & Units 02/17/2024    6:37 AM 02/16/2024    8:38 AM 02/16/2024   12:27 AM  CMP  Glucose 70 - 99 mg/dL 295  90    BUN 8 - 23 mg/dL 35  46    Creatinine 2.84 - 1.24 mg/dL 1.32  4.40    Sodium 102 - 145 mmol/L 142  142    Potassium 3.5 - 5.1 mmol/L 4.4  4.6  5.0   Chloride 98 - 111 mmol/L 111  109    CO2 22 - 32 mmol/L 23  22    Calcium 8.9 - 10.3 mg/dL 8.8  8.9      Imaging studies: No new pertinent imaging studies   Assessment/Plan:  78 y.o. male with left knee/thigh hematoma s/p mechanical fall 4 days ago, complicated by pertinent comorbidities including atrial fibrillation on Eliquis.               - I spent >20 minutes with the patient this morning reviewing treatment options. We did recommend evacuation of this hematoma in the OR to prevent further skin breakdown/compromise. Patient really wishes to avoid surgery if possible, which is  understandable. He is without signs of continued bleeding as Hgb is stable, this does not appear overtly infected, and he has no compressive symptoms. He understands there is always a chance this can result in further skin breakdown or injury.   - Would continue superficial non-adherent dressings and compression with ACE wrap  - I would ambulate him with therapy to ensure no issues  - Appreciate orthopedic input  - Further management per primary service    - Nothing further from a general surgery standpoint. He can discharge once medically cleared. We will be happy to be a resource as needed. Please call with questions/concerns.   All of the above findings and recommendations were discussed with the patient, and the medical team, and all of  patient's questions were answered to his expressed satisfaction.  -- Lynden Oxford, PA-C [facility] 02/17/2024, 9:51 AM M-F: 7am - 4pm

## 2024-02-17 NOTE — Evaluation (Signed)
 Physical Therapy Evaluation Patient Details Name: Roger [name] MRN: [id number] DOB: 1945/12/27 Today's Date: 02/17/2024  History of Present Illness  Pt is a 78 y.o. male presenting to hospital 02/15/24 with c/o L leg pain and swelling after a fall 4 days prior; superficial skin tear of knee and ecchymosis tracing into distal leg and foot noted (L).  Pt admitted with acute blood loss anemia d/t hematoma of L leg, fall, hyperkalemai, AKI.  PMH includes a-fib on Eliquis, ventricular fibrillation, htn, HLD, DM, CAD, dCHF, R hip replacement.  Clinical Impression  Prior to recent medical concerns, pt reports being independent with functional mobility; lives at [facility].  Pt reporting minimal to no pain at rest beginning of therapy session and pt declined pain medication for therapy session.  Currently pt is SBA with bed mobility; pt unable to stand (from regular bed height) with 1 assist but able to stand up to RW with min assist and bed height significantly elevated; and pt able to side step to L along bed a few feet with RW use (pt reporting 10/10 L knee/foot pain and felt like his knee was going to buckle limiting activities).  Pt assisted back to bed to rest end of session and reporting pain resolved with rest.  Nurse updated on pt's status.  Pt requesting to talk to surgery again--surgery PA notified.  Pt would currently benefit from skilled PT to address noted impairments and functional limitations (see below for any additional details).  Upon hospital discharge, pt would benefit from ongoing therapy.     If plan is discharge home, recommend the following: Two people to help with walking and/or transfers;A lot of help with bathing/dressing/bathroom;Assistance with cooking/housework;Assist for transportation;Help with stairs or ramp for entrance   Can travel by private vehicle   No    Equipment Recommendations Rolling walker (2 wheels);BSC/3in1;Wheelchair (measurements PT);Wheelchair  cushion (measurements PT)  Recommendations for Other Services  OT consult    Functional Status Assessment Patient has had a recent decline in their functional status and demonstrates the ability to make significant improvements in function in a reasonable and predictable amount of time.     Precautions / Restrictions Precautions Precautions: Fall Restrictions Weight Bearing Restrictions Per Provider Order: Yes LLE Weight Bearing Per Provider Order: Weight bearing as tolerated Other Position/Activity Restrictions: WBAT per Ortho note 02/17/24      Mobility  Bed Mobility Overal bed mobility: Needs Assistance Bed Mobility: Supine to Sit, Sit to Supine     Supine to sit: Supervision, HOB elevated Sit to supine: Supervision, HOB elevated   General bed mobility comments: increased effort/time for pt to perform on own    Transfers Overall transfer level: Needs assistance Equipment used: Rolling walker (2 wheels) Transfers: Sit to/from Stand Sit to Stand: Total assist           General transfer comment: pt unable to stand from regular bed height with 1 assist and max cueing; pt able to stand up to RW with min assist with bed height significantly elevated    Ambulation/Gait Ambulation/Gait assistance: Min assist Gait Distance (Feet):  (side step to L along bed a few feet) Assistive device: Rolling walker (2 wheels)   Gait velocity: decreased     General Gait Details: antalgic; decreased stance time L LE; heavy UE support noted through [employer] Mobility     Tilt Bed    Modified Rankin (Stroke Patients  Only)       Balance Overall balance assessment: Needs assistance Sitting-balance support: No upper extremity supported, Feet supported Sitting balance-Leahy Scale: Good Sitting balance - Comments: steady reaching within BOS   Standing balance support: Bilateral upper extremity supported, During functional activity, Reliant on  assistive device for balance Standing balance-Leahy Scale: Poor Standing balance comment: assist to steady in standing with RW use                             Pertinent Vitals/Pain Pain Assessment Pain Assessment: 0-10 Pain Score:  (10/10 with activity; 0/10 at rest end of session) Pain Location: L knee and foot Pain Descriptors / Indicators: Tender, Discomfort, Grimacing, Guarding Pain Intervention(s): Limited activity within patient's tolerance, Monitored during session, Repositioned, Other (comment) (RN updated regarding pt's pain status) Vitals (HR and SpO2 on room air) stable throughout session.    Home Living Family/patient expects to be discharged to:: Assisted living                 Home Equipment: [place] [employer] - single point Additional Comments: [place] [employer]    Prior Function Prior Level of Function : Independent/Modified Independent             Mobility Comments: No additional recent falls reported. ADLs Comments: Independent     Extremity/Trunk Assessment   Upper Extremity Assessment Upper Extremity Assessment: Overall WFL for tasks assessed    Lower Extremity Assessment Lower Extremity Assessment: LLE deficits/detail (R LE WFL) LLE Deficits / Details: at least 3/5 AROM hip flexion, knee flexion/extension, and DF LLE: Unable to fully assess due to pain    Cervical / Trunk Assessment Cervical / Trunk Assessment: Normal  Communication   Communication Communication: No apparent difficulties    Cognition Arousal: Alert Behavior During Therapy: WFL for tasks assessed/performed   PT - Cognitive impairments: No apparent impairments                       PT - Cognition Comments: Pt talkative; not always direct with answers (tangential) Following commands: Intact       Cueing Cueing Techniques: Verbal cues     General Comments General comments (skin integrity, edema, etc.): bruising noted L LE; L knee bandage in place.   Nursing cleared pt for participation in physical therapy.  Pt agreeable to PT session.    Exercises     Assessment/Plan    PT Assessment Patient needs continued PT services  PT Problem List Decreased strength;Decreased activity tolerance;Decreased balance;Decreased mobility;Decreased knowledge of use of DME;Decreased knowledge of precautions;Pain;Decreased skin integrity       PT Treatment Interventions DME instruction;Gait training;Functional mobility training;Therapeutic activities;Therapeutic exercise;Balance training;Patient/family education    PT Goals (Current goals can be found in the Care Plan section)  Acute Rehab PT Goals Patient Stated Goal: to improve pain, strength, and walking PT Goal Formulation: With patient Time For Goal Achievement: 03/02/24 Potential to Achieve Goals: Good    Frequency Min 2X/week     Co-evaluation               AM-PAC PT "6 Clicks" Mobility  Outcome Measure Help needed turning from your back to your side while in a flat bed without using bedrails?: None Help needed moving from lying on your back to sitting on the side of a flat bed without using bedrails?: A Little Help needed moving to and from a bed to a chair (including  a wheelchair)?: A Little Help needed standing up from a chair using your arms (e.g., wheelchair or bedside chair)?: Total Help needed to walk in hospital room?: Total Help needed climbing 3-5 steps with a railing? : Total 6 Click Score: 13    End of Session Equipment Utilized During Treatment: Gait belt Activity Tolerance: Patient limited by pain Patient left: in bed;with call bell/phone within reach;with bed alarm set Nurse Communication: Mobility status;Precautions;Other (comment) (Pt's pain status) PT Visit Diagnosis: Other abnormalities of gait and mobility (R26.89);Muscle weakness (generalized) (M62.81);History of falling (Z91.81);Pain Pain - Right/Left: Left Pain - part of body: Knee;Ankle and joints of  foot    Time: 6063-0160 PT Time Calculation (min) (ACUTE ONLY): 22 min   Charges:   PT Evaluation $PT Eval Low Complexity: 1 Low PT Treatments $Therapeutic Activity: 8-22 mins PT General Charges $$ ACUTE PT VISIT: 1 Visit        Hendricks Limes, PT 02/17/24, 12:26 PM

## 2024-02-17 NOTE — Consult Note (Signed)
 ORTHOPAEDIC CONSULTATION  REQUESTING PHYSICIAN: Lucile Shutters, MD  ASSESSMENT AND PLAN: 78 y.o. male with the following: Left medial knee hematoma.  Based on evaluation of the knee, low concern for a ligamentous injury at this time.  XR are negative.  He is understandably guarded on exam.  Hematoma is soft, with some overlying blistering and epidermolysis.  The skin changes are secondary to Eliquis, which has been reversed.  Will discuss with general surgery.  No intervention planned at this point.  No contraindication to surgery around the knee.   - Weight Bearing Status/Activity: As tolerated  - Additional recommended labs/tests: None  -VTE Prophylaxis: Ok to resume once hematoma stabilizes  - Pain control: As needed  - Follow-up plan: As needed  -Procedures: None  Chief Complaint: Left knee pain  HPI: Roger [name] is a 79 y.o. male with PMH as listed below who presented to the emergency department yesterday after a fall.  He states he lost his balance, and fell a few days ago.  He had progressively worsening swelling around the knee.  He also noticed a lot of bruising.  He lives in an assisted living facility, and was finally able to seek treatment.  He states he went to a local urgent care center, but was sent to the emergency department.  He is on Eliquis for A-fib.  He did not hit his head.  Until yesterday, upon admission, he had been walking with some pain in his knee.  He notes restricted motion in the left knee.  Past Medical History:  Diagnosis Date   Anemia    Arthritis    Atrial fibrillation (HCC)    Bladder stone    Dysrhythmia    GERD (gastroesophageal reflux disease)    Hypertension    Past Surgical History:  Procedure Laterality Date   CATARACT EXTRACTION     CORONARY STENT INTERVENTION N/A 04/20/2019   Procedure: CORONARY STENT INTERVENTION;  Surgeon: Alwyn Pea, MD;  Location: [facility];  Service: Cardiovascular;  Laterality: N/A;    CYSTOSCOPY WITH LITHOLAPAXY N/A 10/06/2019   Procedure: CYSTOSCOPY WITH LITHOLAPAXY;  Surgeon: Sondra Come, MD;  Location: [facility];  Service: Urology;  Laterality: N/A;   HIP SURGERY     HOLEP-LASER ENUCLEATION OF THE PROSTATE WITH MORCELLATION N/A 10/06/2019   Procedure: HOLEP-LASER ENUCLEATION OF THE PROSTATE WITH MORCELLATION;  Surgeon: Sondra Come, MD;  Location: [facility];  Service: Urology;  Laterality: N/A;   JOINT REPLACEMENT Right 2012   hip right   LEFT HEART CATH AND CORONARY ANGIOGRAPHY Left 04/20/2019   Procedure: LEFT HEART CATH AND CORONARY ANGIOGRAPHY;  Surgeon: Laurier Nancy, MD;  Location: [facility];  Service: Cardiovascular;  Laterality: Left;   Social History   Socioeconomic History   Marital status: Single    Spouse name: Not on file   Number of children: Not on file   Years of education: Not on file   Highest education level: Not on file  Occupational History   Not on file  Tobacco Use   Smoking status: Former    Current packs/day: 0.50    Types: Cigarettes   Smokeless tobacco: Never  Vaping Use   Vaping status: Never Used  Substance and Sexual Activity   Alcohol use: Yes    Comment: rare   Drug use: No   Sexual activity: Not on file  Other Topics Concern   Not on file  Social History Narrative   Not on  file   Social Drivers of Health   Financial Resource Strain: Not on file  Food Insecurity: No Food Insecurity (02/16/2024)   Hunger Vital Sign    Worried About Running Out of Food in the Last Year: Never true    Ran Out of Food in the Last Year: Never true  Transportation Needs: No Transportation Needs (02/16/2024)   PRAPARE - Administrator, Civil Service (Medical): No    Lack of Transportation (Non-Medical): No  Physical Activity: Not on file  Stress: Not on file  Social Connections: Moderately Integrated (02/16/2024)   Social Connection and Isolation Panel [NHANES]    Frequency of Communication with Friends  and Family: More than three times a week    Frequency of Social Gatherings with Friends and Family: More than three times a week    Attends Religious Services: 1 to 4 times per year    Active Member of [employer] or Organizations: Yes    Attends Engineer, structural: More than 4 times per year    Marital Status: Never married   Family History  Problem Relation Age of Onset   Dementia Mother    COPD Father    Allergies  Allergen Reactions   Amoxicillin Anaphylaxis    Did it involve swelling of the face/tongue/throat, SOB, or low BP? Yes Did it involve sudden or severe rash/hives, skin peeling, or any reaction on the inside of your mouth or nose? No Did you need to seek medical attention at a hospital or doctor's office? No When did it last happen?      10+ years If all above answers are "NO", may proceed with cephalosporin use.    Sulfa Antibiotics Anaphylaxis   Sulfur Dioxide Anaphylaxis   Penicillin G Swelling   Ibuprofen     Stomachache   Pravastatin Other (See Comments)    Irritable bowel   Rosuvastatin Other (See Comments)    Irritable bowel   Prior to Admission medications   Medication Sig Start Date End Date Taking? Authorizing Provider  azelastine (ASTELIN) 0.1 % nasal spray Place 2 sprays into both nostrils 2 (two) times daily. 09/02/23  Yes Margaretann Loveless, MD  Bempedoic Acid-Ezetimibe (NEXLIZET) 180-10 MG TABS TAKE 1 TABLET BY MOUTH EVERY DAY 11/29/23  Yes Margaretann Loveless, MD  Cholecalciferol 25 MCG (1000 UT) tablet Take 1 tablet by mouth daily. 03/12/15  Yes [provider]  ELIQUIS 2.5 MG TABS tablet Take 1 tablet (2.5 mg total) by mouth 2 (two) times daily. 09/17/23  Yes Laurier Nancy, MD  empagliflozin (JARDIANCE) 25 MG TABS tablet Take 1 tablet by mouth daily. 10/21/23  Yes [provider]  enalapril (VASOTEC) 10 MG tablet TAKE ONE-HALF TABLET BY MOUTH EVERY DAY HIGH BLOOD PRESSURE 08/13/22  Yes [provider]  ergocalciferol (VITAMIN  D2) 1.25 MG (50000 UT) capsule Take 50,000 Units by mouth once a week. Thursday   Yes [provider]  hydrocortisone (ANUSOL-HC) 25 MG suppository Place 1 suppository (25 mg total) rectally 2 (two) times daily. 03/07/23  Yes Chesley Noon, MD  ketoconazole (NIZORAL) 2 % shampoo Apply topically. 03/24/21  Yes [provider]  ketotifen (ZADITOR) 0.025 % ophthalmic solution Place 1 drop into both eyes daily as needed (allergies).   Yes [provider]  metoprolol succinate (TOPROL-XL) 50 MG 24 hr tablet Take 50 mg by mouth daily. Take with or immediately following a meal.   Yes [provider]  pantoprazole (PROTONIX) 40 MG tablet  Take 1 tablet (40 mg total) by mouth daily. 04/21/19  Yes Adrian Saran, MD  REPATHA SURECLICK 140 MG/ML SOAJ ADMINISTER 1 INJECTION UNDER THE SKIN EVERY 2 WEEKS 02/02/24  Yes Scoggins, Amber, NP  spironolactone (ALDACTONE) 25 MG tablet Take 12.5 mg by mouth daily. 08/13/19  Yes [provider]  Cholecalciferol (VITAMIN D3) 1.25 MG (50000 UT) CAPS Take 50,000 Units by mouth every Thursday.  Patient not taking: Reported on 09/02/2023 05/02/19   [provider]  ezetimibe (ZETIA) 10 MG tablet Take 10 mg by mouth daily. 10/14/21   [provider]  mupirocin ointment (BACTROBAN) 2 % Apply topically. Patient not taking: Reported on 09/02/2023 11/06/20   [provider]   US Venous Img Lower Unilateral Left (DVT) Result Date: 02/16/2024 CLINICAL DATA:  Edema x4 days, pain EXAM: LEFT LOWER EXTREMITY VENOUS DOPPLER ULTRASOUND TECHNIQUE: Gray-scale sonography with compression, as well as color and duplex ultrasound, were performed to evaluate the deep venous system(s) from the level of the common femoral vein through the popliteal and proximal calf veins. COMPARISON:  Contralateral 01/17/2020 and previous FINDINGS: VENOUS Normal compressibility of the common femoral, superficial femoral, and popliteal veins. Visualized  portions of profunda femoral vein and great saphenous vein unremarkable. No filling defects to suggest DVT on grayscale or color Doppler imaging. Doppler waveforms show normal direction of venous flow, normal respiratory plasticity and response to augmentation. Limited views of the contralateral common femoral vein are unremarkable. OTHER None. Limitations: Technologist describes technically difficult study secondary to patient inability to tolerate compressions for calf vein evaluation. IMPRESSION: No femoropopliteal DVT nor evidence of DVT within the visualized calf veins. If clinical symptoms are inconsistent or if there are persistent or worsening symptoms, further imaging (possibly involving the iliac veins) may be warranted. Electronically Signed   By: Corlis Leak M.D.   On: 02/16/2024 11:03   DG Ankle Complete Left Result Date: 02/16/2024 CLINICAL DATA:  Fall EXAM: LEFT ANKLE COMPLETE - 3+ VIEW COMPARISON:  None Available. FINDINGS: There is no evidence of fracture, dislocation, or joint effusion. There is no evidence of arthropathy or other focal bone abnormality. Soft tissues are unremarkable. IMPRESSION: Negative. Electronically Signed   By: Deatra Robinson M.D.   On: 02/16/2024 01:26   DG Foot Complete Left Result Date: 02/16/2024 CLINICAL DATA:  Fall EXAM: LEFT FOOT - COMPLETE 3+ VIEW COMPARISON:  None Available. FINDINGS: There is no evidence of fracture or dislocation. There is no evidence of arthropathy or other focal bone abnormality. Soft tissues are unremarkable. IMPRESSION: Negative. Electronically Signed   By: Deatra Robinson M.D.   On: 02/16/2024 01:26   CT HEAD WO CONTRAST ( ) Result Date: 02/16/2024 CLINICAL DATA:  Head trauma EXAM: CT HEAD WITHOUT CONTRAST CT CERVICAL SPINE WITHOUT CONTRAST TECHNIQUE: Multidetector CT imaging of the head and cervical spine was performed following the standard protocol without intravenous contrast. Multiplanar CT image reconstructions of the cervical  spine were also generated. RADIATION DOSE REDUCTION: This exam was performed according to the departmental dose-optimization program which includes automated exposure control, adjustment of the mA and/or kV according to patient size and/or use of iterative reconstruction technique. COMPARISON:  10/11/2021 FINDINGS: CT HEAD FINDINGS Brain: No mass,hemorrhage or extra-axial collection. Normal appearance of the parenchyma and CSF spaces. Vascular: Atherosclerotic calcification of the internal carotid arteries at the skull base. No abnormal hyperdensity of the major intracranial arteries or dural venous sinuses. Skull: The visualized skull base, calvarium and extracranial soft tissues are normal. Sinuses/Orbits: No fluid levels  or advanced mucosal thickening of the visualized paranasal sinuses. No mastoid or middle ear effusion. Normal orbits. Other: None. CT CERVICAL SPINE FINDINGS Alignment: No static subluxation. Facets are aligned. Occipital condyles are normally positioned. Skull base and vertebrae: No acute fracture. Soft tissues and spinal canal: No prevertebral fluid or swelling. No visible canal hematoma. Disc levels: No advanced spinal canal or neural foraminal stenosis. Upper chest: No pneumothorax, pulmonary nodule or pleural effusion. Other: Normal visualized paraspinal cervical soft tissues. IMPRESSION: 1. No acute intracranial abnormality. 2. No acute fracture or static subluxation of the cervical spine. Electronically Signed   By: Deatra Robinson M.D.   On: 02/16/2024 01:25   CT Cervical Spine Wo Contrast Result Date: 02/16/2024 CLINICAL DATA:  Head trauma EXAM: CT HEAD WITHOUT CONTRAST CT CERVICAL SPINE WITHOUT CONTRAST TECHNIQUE: Multidetector CT imaging of the head and cervical spine was performed following the standard protocol without intravenous contrast. Multiplanar CT image reconstructions of the cervical spine were also generated. RADIATION DOSE REDUCTION: This exam was performed according to  the departmental dose-optimization program which includes automated exposure control, adjustment of the mA and/or kV according to patient size and/or use of iterative reconstruction technique. COMPARISON:  10/11/2021 FINDINGS: CT HEAD FINDINGS Brain: No mass,hemorrhage or extra-axial collection. Normal appearance of the parenchyma and CSF spaces. Vascular: Atherosclerotic calcification of the internal carotid arteries at the skull base. No abnormal hyperdensity of the major intracranial arteries or dural venous sinuses. Skull: The visualized skull base, calvarium and extracranial soft tissues are normal. Sinuses/Orbits: No fluid levels or advanced mucosal thickening of the visualized paranasal sinuses. No mastoid or middle ear effusion. Normal orbits. Other: None. CT CERVICAL SPINE FINDINGS Alignment: No static subluxation. Facets are aligned. Occipital condyles are normally positioned. Skull base and vertebrae: No acute fracture. Soft tissues and spinal canal: No prevertebral fluid or swelling. No visible canal hematoma. Disc levels: No advanced spinal canal or neural foraminal stenosis. Upper chest: No pneumothorax, pulmonary nodule or pleural effusion. Other: Normal visualized paraspinal cervical soft tissues. IMPRESSION: 1. No acute intracranial abnormality. 2. No acute fracture or static subluxation of the cervical spine. Electronically Signed   By: Deatra Robinson M.D.   On: 02/16/2024 01:25   CT FEMUR LEFT WO CONTRAST Result Date: 02/15/2024 CLINICAL DATA:  Soft tissue mass, thigh, deep Large L leg hematoma after fall EXAM: CT OF THE LOWER LEFT EXTREMITY WITHOUT CONTRAST TECHNIQUE: Multidetector CT imaging of the lower left extremity was performed according to the standard protocol. RADIATION DOSE REDUCTION: This exam was performed according to the departmental dose-optimization program which includes automated exposure control, adjustment of the mA and/or kV according to patient size and/or use of iterative  reconstruction technique. COMPARISON:  X-ray left knee and hip 02/15/2024 FINDINGS: Bones/Joint/Cartilage No evidence of fracture, dislocation, or joint effusion of the left hip and knee. No evidence of severe arthropathy. At least moderate tricompartmental changes of the knee. Severe degenerative changes of the patellofemoral joint. Ligaments Suboptimally assessed by CT. Muscles and Tendons Unremarkable. Soft tissues Distal thigh and knee subcutaneus soft tissue edema with anteromedial 12x3.5x12 cm high density fluid collection suggestive of hematoma. Atherosclerotic plaque. IMPRESSION: 1. Distal thigh and knee subcutaneus soft tissue edema with anteromedial 12x3.5x12 cm hematoma. 2.  No acute displaced fracture or dislocation. Electronically Signed   By: Tish Frederickson M.D.   On: 02/15/2024 23:12   DG Knee Complete 4 Views Left Result Date: 02/15/2024 CLINICAL DATA:  Pain after fall. EXAM: LEFT KNEE - COMPLETE 4+ VIEW COMPARISON:  None Available. FINDINGS: No acute fracture. No dislocation. Mild osteoarthritis primarily in the patellofemoral compartment. No significant knee joint effusion. Generalized soft tissue edema. There are vascular calcifications. Phleboliths in the soft tissues anteriorly. Generalized soft tissue edema. IMPRESSION: 1. No acute fracture or subluxation of the left knee. 2. Mild osteoarthritis. Electronically Signed   By: Narda Rutherford M.D.   On: 02/15/2024 17:50   DG Hip Unilat W or Wo Pelvis 2-3 Views Left Result Date: 02/15/2024 CLINICAL DATA:  Left hip pain after fall. EXAM: DG HIP (WITH OR WITHOUT PELVIS) 2-3V LEFT COMPARISON:  None Available. FINDINGS: No acute fracture of the pelvis or left hip. No hip dislocation. Mild left hip osteoarthritis with acetabular spurring. Pubic rami are intact. Pubic symphysis and sacroiliac joints are congruent. 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 IMPRESSION: 1. No acute fracture or subluxation of the left hip. 2. Mild left hip  osteoarthritis. Electronically Signed   By: Narda Rutherford M.D.   On: 02/15/2024 17:48   Family History Reviewed and non-contributory, no pertinent history of problems with bleeding or anesthesia    Review of Systems No fevers or chills No numbness or tingling No chest pain No shortness of breath No bowel or bladder dysfunction No GI distress No headaches    OBJECTIVE  Vitals:Patient Vitals for the past 8 hrs:  BP Temp Temp src Pulse Resp SpO2 Weight  02/17/24 0733 (!) 111/59 98.2 F (36.8 C) -- 84 18 98 % --  02/17/24 0625 -- -- -- -- -- -- 102.9 kg  02/17/24 0455 129/61 97.6 F (36.4 C) Oral 90 17 100 % --  02/17/24 0027 (!) 126/52 97.9 F (36.6 C) Oral 92 18 98 % --   General: Alert, no acute distress Cardiovascular: Warm extremities noted Respiratory: No cyanosis, no use of accessory musculature GI: No organomegaly, abdomen is soft and non-tender Skin: No lesions in the area of chief complaint other than those listed below in MSK exam.  Neurologic: Sensation intact distally save for the below mentioned MSK exam Psychiatric: Patient is competent for consent with normal mood and affect Lymphatic: No swelling obvious and reported other than the area involved in the exam below Extremities   LLE: Evaluation of left knee demonstrates a lot of ecchymosis, with some blistering.  There is epidermal lysis of the distal medial thigh.  He is guarded when evaluating the left knee, but that the knee does not appear to be unstable.  The hematoma over the medial knee is soft and compressible.  No tenderness to palpation along the medial joint line.  He is able to flex his knee to approximately 70 degrees    Test Results Imaging Left knee x-rays negative  CT scan of the left leg demonstrates a large subcutaneous hematoma.  Labs cbc Recent Labs    02/17/24 0018 02/17/24 0637  WBC 11.0* 10.5  HGB 9.6* 9.7*  HCT 29.6* 29.7*  PLT 279 299    Labs coag Recent Labs     02/16/24 0027  INR 1.5*    Recent Labs    02/16/24 0838 02/17/24 0637  NA 142 142  K 4.6 4.4  CL 109 111  CO2 22 23  GLUCOSE 90 125*  BUN 46* 35*  CREATININE 1.39* 1.27*  CALCIUM 8.9 8.8*

## 2024-02-17 NOTE — Progress Notes (Signed)
 Progress Note   Patient: Roger [name]:[id number] DOB: 05-05-1946 DOA: 02/15/2024     1 DOS: the patient was seen and examined on 02/17/2024   Brief hospital course:  Roger [name] is a 78 y.o. male with medical history significant of A fib on Eliquis, ventricular fibrillation, HTN, HLD, DM, CAD, dCHF, who presents with fall, left leg pain and swelling.   Patient states that he fell accidentally 4 days ago. The patient states that he had a mechanical fall from standing height onto his left knee. No LOC. No headache or neck pain. He developed swelling and left leg pain, which has been progressively worsening. He has bruise in left lower part of the thigh, upper part of the knee, ankle and foot, with weeping from a large skin tear along the knee. He has discoloration to the left foot. Per Dr. Elesa [name] of ED, pt has dopplerable pulse in left leg. He states that he has been able to walk and bear weight on the left leg since the fall.   Patient does not have chest pain, cough, SOB.  No nausea, vomiting, diarrhea or abdominal pain.  No symptoms of UTI.  He states that he took last dose Eliquis yesterday morning (02/15/24 morning).    Data reviewed independently and ED Course: pt was found to have Hgb dropped from 14.2 on 12/03/23 to 9.7, AKI with creatinine 1.43, BUN 51, GFR 51 (baseline creatinine 1.23 with a GFR> 60 on 12/03/2023), potassium 5.3 with hemolysis, pending repeated potassium, WBC 10.7.  Temperature normal, blood pressure 110/54, heart rate 91, 18, oxygen 99% on room air.  X-ray of the left knee and left hip negative for fracture. Pt is admitted to telemetry bed as inpatient.  Message sent to Dr. Everlene [name] of surgery for consult.   CT of left femur showed large hematoma. 1. Distal thigh and knee subcutaneus soft tissue edema with anteromedial 12x3.5x12 cm hematoma. 2.  No acute displaced fracture or dislocation.   CT of head and CT of neck: Negative for acute injury.   X-ray of left ankle  and X-ray of left foot: Negative for fracture   Assessment and Plan:  Acute blood loss anemia due to hematoma of leg, left, initial encounter:  Traumatic and related to recent mechanical fall in the setting of chronic anticoagulation use Patient noted to have a drop in his hemoglobin from 14.2 to 9.7 and this has remained stable since admission Patient was on Eliquis and this was reversed on admission.  He received Kcentra Monitor H&H closely Appreciate surgery and orthopedic input, recommended evacuation of hematoma in the OR to prevent further skin breakdown/compromise which patient declined Continue nonadherent dressing and compression with Ace wrap    Mechanical fall at home, initial encounter -PT OT evaluation -Fall precaution -Local wound care for blistering over the left knee    Atrial fibrillation, chronic (HCC) -Hold Eliquis due to significant hematoma and acute blood loss anemia -Continue metoprolol    CAD S/P percutaneous coronary angioplasty: No chest pain Continue Nexlizet and Repatha   Diabetes mellitus without complication (HCC):  Recent A1c 6.9, well-controlled.   Continue Jardiance -Sliding scale insulin Continue consistent carbohydrate diet    Chronic diastolic congestive heart failure (HCC):  Stable and not acutely exacerbated 2D echo on 04/19/2019 showed EF of 50-55%.   Continue to hold spironolactone and enalapril since patient is normotensive Continue metoprolol   Essential hypertension, benign Continue metoprolol -Continue to hold enalapril and spironolactone     Hyperkalemia:  Resolved Potassium 5.3, likely due to hemolysis. -Repeat potassium level    AKI (acute kidney injury) (HCC): -Continue to hold hold spironolactone and enalapril -Repeat renal parameters   Obesity (BMI 30-39.9): Class I -Encourage losing weight -Exercise and healthy diet           Subjective: Patient is seen and examined at the bedside.  Has no  complaints  Physical Exam: Vitals:   02/17/24 0027 02/17/24 0455 02/17/24 0625 02/17/24 0733  BP: (!) 126/52 129/61  (!) 111/59  Pulse: 92 90  84  Resp: 18 17  18   Temp: 97.9 F (36.6 C) 97.6 F (36.4 C)  98.2 F (36.8 C)  TempSrc: Oral Oral    SpO2: 98% 100%  98%  Weight:   102.9 kg   Height:       General: Not in acute distress HEENT:       Eyes: PERRL, EOMI, no jaundice       ENT: No discharge from the ears and nose, no pharynx injection, no tonsillar enlargement.        Neck: No JVD, no bruit, no mass felt. Heme: No neck lymph node enlargement. Cardiac: S1/S2, RRR, No murmurs, No gallops or rubs. Respiratory: No rales, wheezing, rhonchi or rubs. GI: Soft, nondistended, nontender, no rebound pain, no organomegaly, BS present. GU: No hematuria Ext: Ecchymosis of left knee, swelling with skin blistering over the distal thigh. Has chronic venous changes in right lowe legs.      Musculoskeletal: Decreased range of motion left knee and Skin: No rashes.  Neuro: Alert, oriented X3, cranial nerves II-XII grossly intact, moves all extremities. Psych: Patient is not psychotic, no suicidal or hemocidal ideation.    Data Reviewed: Labs reviewed.  Creatinine 1.27, hemoglobin 97 There are no new results to review at this time.  Family Communication: Treatment plan discussed with patient in detail.  Explained to him the need for evacuation of the hematoma due to increased risk of skin compromise and infection.  At this point patient has declined evacuation.  PT eval  Disposition: Status is: Inpatient Remains inpatient appropriate because: Awaiting PT evaluation for discharge disposition  Planned Discharge Destination: Skilled nursing facility    Time spent: 35 minutes  Author: Lucile Shutters, MD 02/17/2024 11:00 AM  For on call review [email or web address].

## 2024-02-17 NOTE — Plan of Care (Signed)

## 2024-02-18 ENCOUNTER — Encounter: Payer: Self-pay | Admitting: Internal Medicine

## 2024-02-18 ENCOUNTER — Inpatient Hospital Stay: Admitting: Certified Registered"

## 2024-02-18 ENCOUNTER — Other Ambulatory Visit: Payer: Self-pay

## 2024-02-18 ENCOUNTER — Encounter
Admission: EM | Disposition: A | Discharge status: Skilled Nursing Facility | Payer: Self-pay | Source: Home / Self Care | Attending: Internal Medicine

## 2024-02-18 DIAGNOSIS — D62 Acute posthemorrhagic anemia: Secondary | ICD-10-CM | POA: Diagnosis not present

## 2024-02-18 DIAGNOSIS — S7012XA Contusion of left thigh, initial encounter: Secondary | ICD-10-CM | POA: Diagnosis not present

## 2024-02-18 HISTORY — PX: INCISION AND DRAINAGE OF HEMATOMA, KNEE: SHX7375

## 2024-02-18 LAB — GLUCOSE, CAPILLARY
Glucose-Capillary: 136 mg/dL — ABNORMAL HIGH (ref 70–99)
Glucose-Capillary: 128 mg/dL — ABNORMAL HIGH (ref 70–99)
Glucose-Capillary: 129 mg/dL — ABNORMAL HIGH (ref 70–99)
Glucose-Capillary: 156 mg/dL — ABNORMAL HIGH (ref 70–99)
Glucose-Capillary: 111 mg/dL — ABNORMAL HIGH (ref 70–99)

## 2024-02-18 LAB — CBC
HCT: 32.5 % — ABNORMAL LOW (ref 39.0–52.0)
Hemoglobin: 10.2 g/dL — ABNORMAL LOW (ref 13.0–17.0)
MCH: 31.5 pg (ref 26.0–34.0)
MCHC: 31.4 g/dL (ref 30.0–36.0)
MCV: 100.3 fL — ABNORMAL HIGH (ref 80.0–100.0)
Platelets: 311 10*3/uL (ref 150–400)
RBC: 3.24 MIL/uL — ABNORMAL LOW (ref 4.22–5.81)
RDW: 15 % (ref 11.5–15.5)
WBC: 10.8 10*3/uL — ABNORMAL HIGH (ref 4.0–10.5)
nRBC: 0 % (ref 0.0–0.2)

## 2024-02-18 LAB — BASIC METABOLIC PANEL
Anion gap: 7 (ref 5–15)
BUN: 32 mg/dL — ABNORMAL HIGH (ref 8–23)
CO2: 24 mmol/L (ref 22–32)
Calcium: 8.5 mg/dL — ABNORMAL LOW (ref 8.9–10.3)
Chloride: 111 mmol/L (ref 98–111)
Creatinine, Ser: 1.27 mg/dL — ABNORMAL HIGH (ref 0.61–1.24)
GFR, Estimated: 59 mL/min — ABNORMAL LOW (ref >60)
Glucose, Bld: 134 mg/dL — ABNORMAL HIGH (ref 70–99)
Potassium: 4.4 mmol/L (ref 3.5–5.1)
Sodium: 142 mmol/L (ref 135–145)

## 2024-02-18 SURGERY — INCISION AND DRAINAGE OF HEMATOMA, KNEE
Anesthesia: General | Laterality: Left

## 2024-02-18 MED ORDER — ACETAMINOPHEN 10 MG/ML IV SOLN
INTRAVENOUS | Status: DC | PRN
  Administered 2024-02-18: 1000 mg via INTRAVENOUS

## 2024-02-18 MED ORDER — KETAMINE HCL 50 MG/5ML IJ SOSY
PREFILLED_SYRINGE | INTRAMUSCULAR | Status: AC
  Filled 2024-02-18: qty 5

## 2024-02-18 MED ORDER — FENTANYL CITRATE (PF) 100 MCG/2ML IJ SOLN: 25.0000 ug | INTRAMUSCULAR | Status: DC | PRN

## 2024-02-18 MED ORDER — LIDOCAINE HCL (CARDIAC) PF 100 MG/5ML IV SOSY
PREFILLED_SYRINGE | INTRAVENOUS | Status: DC | PRN
  Administered 2024-02-18: 60 mg via INTRAVENOUS

## 2024-02-18 MED ORDER — OXYCODONE HCL 5 MG PO TABS: 5.0000 mg | ORAL_TABLET | Freq: Once | ORAL | Status: DC | PRN

## 2024-02-18 MED ORDER — CHLORHEXIDINE GLUCONATE 0.12 % MT SOLN
15.0000 mL | Freq: Once | OROMUCOSAL | Status: AC
  Administered 2024-02-18: 15 mL via OROMUCOSAL

## 2024-02-18 MED ORDER — LACTATED RINGERS IV SOLN: INTRAVENOUS | Status: DC | PRN

## 2024-02-18 MED ORDER — 0.9 % SODIUM CHLORIDE (POUR BTL) OPTIME
TOPICAL | Status: DC | PRN
  Administered 2024-02-18: 1000 mL

## 2024-02-18 MED ORDER — METOPROLOL TARTRATE 5 MG/5ML IV SOLN
INTRAVENOUS | Status: DC | PRN
  Administered 2024-02-18: 2 mg via INTRAVENOUS
  Administered 2024-02-18: 1 mg via INTRAVENOUS
  Administered 2024-02-18: 2 mg via INTRAVENOUS

## 2024-02-18 MED ORDER — CHLORHEXIDINE GLUCONATE 0.12 % MT SOLN
OROMUCOSAL | Status: AC
  Filled 2024-02-18: qty 15

## 2024-02-18 MED ORDER — LACTATED RINGERS IV SOLN: INTRAVENOUS | Status: DC

## 2024-02-18 MED ORDER — ACETAMINOPHEN 10 MG/ML IV SOLN
INTRAVENOUS | Status: AC
Start: 2024-02-18 — End: ?
  Filled 2024-02-18: qty 100

## 2024-02-18 MED ORDER — PROPOFOL 1000 MG/100ML IV EMUL
INTRAVENOUS | Status: AC
  Filled 2024-02-18: qty 100

## 2024-02-18 MED ORDER — CEFAZOLIN SODIUM-DEXTROSE 2-3 GM-%(50ML) IV SOLR
INTRAVENOUS | Status: DC | PRN
Start: 2024-02-18 — End: 2024-02-18
  Administered 2024-02-18: 2 g via INTRAVENOUS

## 2024-02-18 MED ORDER — FENTANYL CITRATE (PF) 100 MCG/2ML IJ SOLN
INTRAMUSCULAR | Status: DC | PRN
  Administered 2024-02-18 (×2): 25 ug via INTRAVENOUS
  Administered 2024-02-18: 50 ug via INTRAVENOUS

## 2024-02-18 MED ORDER — CEFAZOLIN SODIUM-DEXTROSE 2-4 GM/100ML-% IV SOLN
INTRAVENOUS | Status: AC
  Filled 2024-02-18: qty 100

## 2024-02-18 MED ORDER — PROPOFOL 10 MG/ML IV BOLUS
INTRAVENOUS | Status: DC | PRN
  Administered 2024-02-18: 150 mg via INTRAVENOUS

## 2024-02-18 MED ORDER — OXYCODONE HCL 5 MG/5ML PO SOLN: 5.0000 mg | Freq: Once | ORAL | Status: DC | PRN

## 2024-02-18 MED ORDER — ACETAMINOPHEN 10 MG/ML IV SOLN: 1000.0000 mg | Freq: Once | INTRAVENOUS | Status: DC | PRN

## 2024-02-18 MED ORDER — KETAMINE HCL 50 MG/5ML IJ SOSY
PREFILLED_SYRINGE | INTRAMUSCULAR | Status: DC | PRN
  Administered 2024-02-18: 20 mg via INTRAVENOUS
  Administered 2024-02-18: 30 mg via INTRAVENOUS

## 2024-02-18 SURGICAL SUPPLY — 16 items
BNDG COHESIVE 4X5 TAN STRL LF (GAUZE/BANDAGES/DRESSINGS) IMPLANT
ELECT REM PT RETURN 9FT ADLT (ELECTROSURGICAL) ×1 IMPLANT
ELECTRODE REM PT RTRN 9FT ADLT (ELECTROSURGICAL) ×1 IMPLANT
GLOVE BIO SURGEON STRL SZ7 (GLOVE) ×1 IMPLANT
GOWN STRL REUS W/ TWL LRG LVL3 (GOWN DISPOSABLE) ×2 IMPLANT
MANIFOLD NEPTUNE II (INSTRUMENTS) ×1 IMPLANT
NDL HYPO 22X1.5 SAFETY MO (MISCELLANEOUS) IMPLANT
NEEDLE HYPO 22X1.5 SAFETY MO (MISCELLANEOUS) IMPLANT
NS IRRIG 1000ML POUR BTL (IV SOLUTION) ×1 IMPLANT
PACK EXTREMITY ARMC (MISCELLANEOUS) ×1 IMPLANT
PAD PREP OB/GYN DISP 24X41 (PERSONAL CARE ITEMS) ×1 IMPLANT
SOL PREP PVP 2OZ (MISCELLANEOUS) IMPLANT
SOLUTION PREP PVP 2OZ (MISCELLANEOUS) IMPLANT
SPONGE T-LAP 18X18 ~~LOC~~+RFID (SPONGE) ×1 IMPLANT
STOCKINETTE IMPERVIOUS 9X36 MD (GAUZE/BANDAGES/DRESSINGS) IMPLANT
TRAP FLUID SMOKE EVACUATOR (MISCELLANEOUS) ×1 IMPLANT

## 2024-02-18 NOTE — Plan of Care (Signed)

## 2024-02-18 NOTE — Anesthesia Postprocedure Evaluation (Signed)
 Anesthesia Post Note  Patient: Roger [name]  Procedure(s) Performed: INCISION AND DRAINAGE OF HEMATOMA, KNEE (Left)  Patient location during evaluation: PACU Anesthesia Type: General Level of consciousness: awake and alert, oriented and patient cooperative Pain management: pain level controlled Vital Signs Assessment: post-procedure vital signs reviewed and stable Respiratory status: spontaneous breathing, nonlabored ventilation and respiratory function stable Cardiovascular status: blood pressure returned to baseline and stable Postop Assessment: adequate PO intake Anesthetic complications: no   There were no known notable events for this encounter.   Last Vitals:  Vitals:   02/18/24 1045 02/18/24 1113  BP: 135/68 125/68  Pulse: 96 90  Resp: 19 16  Temp:    SpO2: 97% 97%    Last Pain:  Vitals:   02/18/24 1113  TempSrc:   PainSc: 0-No pain                 Reed Breech

## 2024-02-18 NOTE — TOC Progression Note (Signed)
 Transition of Care [facility]) - Progression Note    Patient Details  Name: Roger [name] MRN: [id number] Date of Birth: 1946-03-07  Transition of [facility]) CM/SW Contact  Erin Sons, [place] Phone Number: 02/18/2024, 2:10 PM  Clinical Narrative:      CSW met with pt and confirmed pt is from [facility] and plans to go to [facility] for rehab. CSW confirmed with [facility] that pt can admit tomorrow. They would need DC summary by 130p today so their pharmacy can fill meds;MD has completed this. DC summary will have to be re-dated for 3/15 prior to DC. Fl2 and DC summary have been sent to facility.            Social Determinants of Health (SDOH) Interventions SDOH Screenings   Food Insecurity: No Food Insecurity (02/16/2024)  Housing: Low Risk  (02/16/2024)  Transportation Needs: No Transportation Needs (02/16/2024)  Utilities: Not At Risk (02/16/2024)  Social Connections: Moderately Integrated (02/16/2024)  Tobacco Use: Medium Risk (02/18/2024)    Readmission Risk Interventions     No data to display

## 2024-02-18 NOTE — Transfer of Care (Signed)
 Immediate Anesthesia Transfer of Care Note  Patient: Roger [name]  Procedure(s) Performed: INCISION AND DRAINAGE OF HEMATOMA, KNEE (Left)  Patient Location: PACU  Anesthesia Type:General  Level of Consciousness: drowsy and patient cooperative  Airway & Oxygen Therapy: Patient Spontanous Breathing and Patient connected to face mask oxygen  Post-op Assessment: Report given to RN and Post -op Vital signs reviewed and stable  Post vital signs: stable with just face mask and oxygen 5L  Last Vitals:  Vitals Value Taken Time  BP 124/65 02/18/24 1022  Temp    Pulse 91 02/18/24 1025  Resp 19 02/18/24 1025  SpO2 100 % 02/18/24 1025  Vitals shown include unfiled device data.  Last Pain:  Vitals:   02/18/24 0848  TempSrc: Oral  PainSc: 0-No pain      Patients Stated Pain Goal: 0 (02/17/24 2226)  Complications: No notable events documented.

## 2024-02-18 NOTE — Progress Notes (Signed)
 Preoperative Informed Consent Review   I have discussed the risks, benefits, and options for this particular procedure with Roger [name] and family.  He understands the risk for this operation and the alternatives for intervention.  He is in agreement.  He has had his questions answered to his stated satisfaction.  Patient agrees to proceed with this procedure at this time.  Ricarda Frame MD FACS

## 2024-02-18 NOTE — Op Note (Signed)
  02/18/2024  10:19 AM  PATIENT:  Roger [name]  78 y.o. male  PRE-OPERATIVE DIAGNOSIS:  Deep Left thigh hematoma  POST-OPERATIVE DIAGNOSIS:  Same  PROCEDURE:   1.Incision and drainage of complex left deep thigh hematoma 2. Excisional debridement of skin subcutaneous tissue and muscle  measuring 4 square centimeters    SURGEON:  Surgeons and Role:    * Jalin Alicea, Merri Ray, MD - Primary  ANESTHESIA: LMA General  FINDINGS: Large Left thigh hematoma about 300cc of clot evacuated  DICTATION:  Patient was explained about the procedure in detail. Risks, benefits possible complications and a consent was obtained. The patient taken to the operating room and placed in the supine position. Lower extremity was prepped and draped in the standard fashion. Medial elliptical incision was created . We evacuated a deep thigh hematoma, about 300cc clot. No active bleeding observed, Excisional debridement of a 2x2 area of devitalized fascial tissue was done  Hemostasis was obtained with electrocautery. Irrigation with normal saline and the wound was packed with 2 inch Conform  packing. . Needle and laparotomy counts were correct and there were no immediate complications  Leafy Ro, MD

## 2024-02-18 NOTE — Discharge Summary (Addendum)
 Physician Discharge Summary   Patient: Roger [name] MRN: [id number] DOB: 1946-05-20  Admit date:     02/15/2024  Discharge date: 02/19/2024  Discharge Physician: Roger [name]   PCP: Roger [name]   Recommendations at discharge:   Resume Eliquis on 02/21/24 Repeat CBC in 5 to 7 days postdischarge Pack wound daily withwrapping the knee and elevation   Discharge Diagnoses: Principal Problem:   Acute blood loss anemia Active Problems:   Hematoma of leg, left, initial encounter   Fall at home, initial encounter   Atrial fibrillation, chronic (HCC)   CAD S/P percutaneous coronary angioplasty   Diabetes mellitus without complication (HCC)   Chronic diastolic congestive heart failure (HCC)   Essential hypertension, benign   HLD (hyperlipidemia)   Hyperkalemia   AKI (acute kidney injury) (HCC)   Obesity (BMI 30-39.9)   Hematoma of left thigh  Resolved Problems:   * No resolved hospital problems. *  Hospital Course: [name] is a 78 y.o. male with medical history significant of A fib on Eliquis, ventricular fibrillation, HTN, HLD, DM, CAD, dCHF, who presents with fall, left leg pain and swelling.   Patient states that he fell accidentally 4 days ago. The patient states that he had a mechanical fall from standing height onto his left knee. No LOC. No headache or neck pain. He developed swelling and left leg pain, which has been progressively worsening. He has bruise in left lower part of the thigh, upper part of the knee, ankle and foot, with weeping from a large skin tear along the knee. He has discoloration to the left foot. Per Dr. Elesa Massed of ED, pt has dopplerable pulse in left leg. He states that he has been able to walk and bear weight on the left leg since the fall.   Patient does not have chest pain, cough, SOB.  No nausea, vomiting, diarrhea or abdominal pain.  No symptoms of UTI.  He states that he took last dose Eliquis yesterday morning (02/15/24 morning).    Data  reviewed independently and ED Course: pt was found to have Hgb dropped from 14.2 on 12/03/23 to 9.7, AKI with creatinine 1.43, BUN 51, GFR 51 (baseline creatinine 1.23 with a GFR> 60 on 12/03/2023), potassium 5.3 with hemolysis, pending repeated potassium, WBC 10.7.  Temperature normal, blood pressure 110/54, heart rate 91, 18, oxygen 99% on room air.  X-ray of the left knee and left hip negative for fracture. Pt is admitted to telemetry bed as inpatient.  Message sent to Dr. Everlene Farrier of surgery for consult.   CT of left femur showed large hematoma. 1. Distal thigh and knee subcutaneus soft tissue edema with anteromedial 12x3.5x12 cm hematoma. 2.  No acute displaced fracture or dislocation.   CT of head and CT of neck: Negative for acute injury.   X-ray of left ankle and X-ray of left foot: Negative for fracture   EKG:  Not done in ED, will get one.   Assessment and Plan:  Acute blood loss anemia due to hematoma of leg, left, initial encounter:  Traumatic and related to recent mechanical fall in the setting of chronic anticoagulation use Patient noted to have a drop in his hemoglobin from 14.2 to 9.7 and this has remained stable since admission Patient was on Eliquis and this was reversed on admission.  He received Kcentra H&H has remained stable Appreciate surgery and orthopedic input, patient is status post incision and drainage of complex left deep thigh hematoma. Excisional debridement  of skin subcutaneous tissue and muscle  measuring 4 square centimeters Surgery recommends daily packing with wrapping the knee and elevation     Mechanical fall at home, initial encounter -PT/OT evaluated patient and he will need continued PT at the skilled nursing facility -Fall precaution      Atrial fibrillation, chronic (HCC) -Eliquis was held due to significant hematoma and acute blood loss anemia and will be resumed on 02/21/24 -Continue metoprolol     CAD S/P percutaneous coronary angioplasty:  No chest pain Continue Nexlizet and Repatha    Diabetes mellitus without complication [facility]):  Recent A1c 6.9, well-controlled.   Continue Jardiance Continue consistent carbohydrate diet     Chronic diastolic congestive heart failure (HCC):  Stable and not acutely exacerbated 2D echo on 04/19/2019 showed EF of 50-55%.   Continue metoprolol, spironolactone and enalapril      Essential hypertension, benign Continue metoprolol, enalapril and spironolactone      Hyperkalemia:  Resolved Potassium 5.3, likely due to hemolysis.      AKI (acute kidney injury) (HCC): -Renal function is back to baseline Continue spironolactone and enalapril      Obesity (BMI 30-39.9): Class I -Encourage losing weight -Exercise and healthy diet              Consultants: General Surgery, orthopedic surgery Procedures performed: Incision and drainage of complex left deep thigh hematoma, excisional debridement of skin subcutaneous tissue and muscle Disposition: Skilled nursing facility Diet recommendation:  Discharge Diet Orders (From admission, onward)     Start     Ordered   02/18/24 0000  Diet - low sodium heart healthy        02/18/24 1148   02/18/24 0000  Diet Carb Modified        02/18/24 1148           Carb modified diet DISCHARGE MEDICATION: Allergies as of 02/18/2024       Reactions   Amoxicillin Anaphylaxis   Did it involve swelling of the face/tongue/throat, SOB, or low BP? Yes Did it involve sudden or severe rash/hives, skin peeling, or any reaction on the inside of your mouth or nose? No Did you need to seek medical attention at a hospital or doctor's office? No When did it last happen?      10+ years If all above answers are "NO", may proceed with cephalosporin use.   Sulfa Antibiotics Anaphylaxis   Sulfur Dioxide Anaphylaxis   Penicillin G Swelling   Ibuprofen    Stomachache   Pravastatin Other (See Comments)   Irritable bowel   Rosuvastatin Other (See  Comments)   Irritable bowel        Medication List     STOP taking these medications    Cholecalciferol 25 MCG (1000 UT) tablet   Vitamin D3 1.25 MG (50000 UT) Caps       TAKE these medications    azelastine 0.1 % nasal spray Commonly known as: ASTELIN Place 2 sprays into both nostrils 2 (two) times daily.   Eliquis 2.5 MG Tabs tablet Generic drug: apixaban Take 1 tablet (2.5 mg total) by mouth 2 (two) times daily.   empagliflozin 25 MG Tabs tablet Commonly known as: JARDIANCE Take 1 tablet by mouth daily.   enalapril 10 MG tablet Commonly known as: VASOTEC TAKE ONE-HALF TABLET BY MOUTH EVERY DAY HIGH BLOOD PRESSURE   ergocalciferol 1.25 MG (50000 UT) capsule Commonly known as: VITAMIN D2 Take 50,000 Units by mouth once a week. Thursday  ezetimibe 10 MG tablet Commonly known as: ZETIA Take 10 mg by mouth daily.   hydrocortisone 25 MG suppository Commonly known as: ANUSOL-HC Place 1 suppository (25 mg total) rectally 2 (two) times daily.   ketoconazole 2 % shampoo Commonly known as: NIZORAL Apply topically.   ketotifen 0.025 % ophthalmic solution Commonly known as: ZADITOR Place 1 drop into both eyes daily as needed (allergies).   metoprolol succinate 50 MG 24 hr tablet Commonly known as: TOPROL-XL Take 50 mg by mouth daily. Take with or immediately following a meal.   mupirocin ointment 2 % Commonly known as: BACTROBAN Apply topically.   Nexlizet 180-10 MG Tabs Generic drug: Bempedoic Acid-Ezetimibe TAKE 1 TABLET BY MOUTH EVERY DAY   pantoprazole 40 MG tablet Commonly known as: Protonix Take 1 tablet (40 mg total) by mouth daily.   Repatha SureClick 140 MG/ML Soaj Generic drug: Evolocumab ADMINISTER 1 INJECTION UNDER THE SKIN EVERY 2 WEEKS   spironolactone 25 MG tablet Commonly known as: ALDACTONE Take 12.5 mg by mouth daily.               Discharge Care Instructions  (From admission, onward)           Start     Ordered    02/18/24 0000  Discharge wound care:       Comments: Daily packing w wrapping the knee and elevation   02/18/24 1148            Follow-up Information     Roger [name] Follow up.   Specialty: Internal Medicine Why: Hospital follow up Contact information: [address] [place] [phone]                Discharge Exam: Filed Weights   02/17/24 0625 02/18/24 0326 02/18/24 0848  Weight: 102.9 kg 102.5 kg 102.5 kg   General: Not in acute distress HEENT:       Eyes: PERRL, EOMI, no jaundice       ENT: No discharge from the ears and nose, no pharynx injection, no tonsillar enlargement.        Neck: No JVD, no bruit, no mass felt. Heme: No neck lymph node enlargement. Cardiac: S1/S2, RRR, No murmurs, No gallops or rubs. Respiratory: No rales, wheezing, rhonchi or rubs. GI: Soft, nondistended, nontender, no rebound pain, no organomegaly, BS present. GU: No hematuria Ext: Ecchymosis of left knee, swelling with skin blistering over the distal thigh. Has chronic venous changes in right lowe legs.      Musculoskeletal: Decreased range of motion left knee and Skin: No rashes.  Neuro: Alert, oriented X3, cranial nerves II-XII grossly intact, moves all extremities. Psych: Patient is not psychotic, no suicidal or hemocidal ideation.     Condition at discharge: stable  The results of significant diagnostics from this hospitalization (including imaging, microbiology, ancillary and laboratory) are listed below for reference.   Imaging Studies: US Venous Img Lower Unilateral Left (DVT) Result Date: 02/16/2024 CLINICAL DATA:  Edema x4 days, pain EXAM: LEFT LOWER EXTREMITY VENOUS DOPPLER ULTRASOUND TECHNIQUE: Gray-scale sonography with compression, as well as color and duplex ultrasound, were performed to evaluate the deep venous system(s) from the level of the common femoral vein through the popliteal and proximal calf veins. COMPARISON:  Contralateral  01/17/2020 and previous FINDINGS: VENOUS Normal compressibility of the common femoral, superficial femoral, and popliteal veins. Visualized portions of profunda femoral vein and great saphenous vein unremarkable. No filling defects to suggest DVT on grayscale or color  Doppler imaging. Doppler waveforms show normal direction of venous flow, normal respiratory plasticity and response to augmentation. Limited views of the contralateral common femoral vein are unremarkable. OTHER None. Limitations: Technologist describes technically difficult study secondary to patient inability to tolerate compressions for calf vein evaluation. IMPRESSION: No femoropopliteal DVT nor evidence of DVT within the visualized calf veins. If clinical symptoms are inconsistent or if there are persistent or worsening symptoms, further imaging (possibly involving the iliac veins) may be warranted. Electronically Signed   By: Corlis Leak M.D.   On: 02/16/2024 11:03   DG Ankle Complete Left Result Date: 02/16/2024 CLINICAL DATA:  Fall EXAM: LEFT ANKLE COMPLETE - 3+ VIEW COMPARISON:  None Available. FINDINGS: There is no evidence of fracture, dislocation, or joint effusion. There is no evidence of arthropathy or other focal bone abnormality. Soft tissues are unremarkable. IMPRESSION: Negative. Electronically Signed   By: Deatra Robinson M.D.   On: 02/16/2024 01:26   DG Foot Complete Left Result Date: 02/16/2024 CLINICAL DATA:  Fall EXAM: LEFT FOOT - COMPLETE 3+ VIEW COMPARISON:  None Available. FINDINGS: There is no evidence of fracture or dislocation. There is no evidence of arthropathy or other focal bone abnormality. Soft tissues are unremarkable. IMPRESSION: Negative. Electronically Signed   By: Deatra Robinson M.D.   On: 02/16/2024 01:26   CT HEAD WO CONTRAST ( ) Result Date: 02/16/2024 CLINICAL DATA:  Head trauma EXAM: CT HEAD WITHOUT CONTRAST CT CERVICAL SPINE WITHOUT CONTRAST TECHNIQUE: Multidetector CT imaging of the head and  cervical spine was performed following the standard protocol without intravenous contrast. Multiplanar CT image reconstructions of the cervical spine were also generated. RADIATION DOSE REDUCTION: This exam was performed according to the departmental dose-optimization program which includes automated exposure control, adjustment of the mA and/or kV according to patient size and/or use of iterative reconstruction technique. COMPARISON:  10/11/2021 FINDINGS: CT HEAD FINDINGS Brain: No mass,hemorrhage or extra-axial collection. Normal appearance of the parenchyma and CSF spaces. Vascular: Atherosclerotic calcification of the internal carotid arteries at the skull base. No abnormal hyperdensity of the major intracranial arteries or dural venous sinuses. Skull: The visualized skull base, calvarium and extracranial soft tissues are normal. Sinuses/Orbits: No fluid levels or advanced mucosal thickening of the visualized paranasal sinuses. No mastoid or middle ear effusion. Normal orbits. Other: None. CT CERVICAL SPINE FINDINGS Alignment: No static subluxation. Facets are aligned. Occipital condyles are normally positioned. Skull base and vertebrae: No acute fracture. Soft tissues and spinal canal: No prevertebral fluid or swelling. No visible canal hematoma. Disc levels: No advanced spinal canal or neural foraminal stenosis. Upper chest: No pneumothorax, pulmonary nodule or pleural effusion. Other: Normal visualized paraspinal cervical soft tissues. IMPRESSION: 1. No acute intracranial abnormality. 2. No acute fracture or static subluxation of the cervical spine. Electronically Signed   By: Deatra Robinson M.D.   On: 02/16/2024 01:25   CT Cervical Spine Wo Contrast Result Date: 02/16/2024 CLINICAL DATA:  Head trauma EXAM: CT HEAD WITHOUT CONTRAST CT CERVICAL SPINE WITHOUT CONTRAST TECHNIQUE: Multidetector CT imaging of the head and cervical spine was performed following the standard protocol without intravenous contrast.  Multiplanar CT image reconstructions of the cervical spine were also generated. RADIATION DOSE REDUCTION: This exam was performed according to the departmental dose-optimization program which includes automated exposure control, adjustment of the mA and/or kV according to patient size and/or use of iterative reconstruction technique. COMPARISON:  10/11/2021 FINDINGS: CT HEAD FINDINGS Brain: No mass,hemorrhage or extra-axial collection. Normal appearance of the parenchyma and CSF  spaces. Vascular: Atherosclerotic calcification of the internal carotid arteries at the skull base. No abnormal hyperdensity of the major intracranial arteries or dural venous sinuses. Skull: The visualized skull base, calvarium and extracranial soft tissues are normal. Sinuses/Orbits: No fluid levels or advanced mucosal thickening of the visualized paranasal sinuses. No mastoid or middle ear effusion. Normal orbits. Other: None. CT CERVICAL SPINE FINDINGS Alignment: No static subluxation. Facets are aligned. Occipital condyles are normally positioned. Skull base and vertebrae: No acute fracture. Soft tissues and spinal canal: No prevertebral fluid or swelling. No visible canal hematoma. Disc levels: No advanced spinal canal or neural foraminal stenosis. Upper chest: No pneumothorax, pulmonary nodule or pleural effusion. Other: Normal visualized paraspinal cervical soft tissues. IMPRESSION: 1. No acute intracranial abnormality. 2. No acute fracture or static subluxation of the cervical spine. Electronically Signed   By: Deatra Robinson M.D.   On: 02/16/2024 01:25   CT FEMUR LEFT WO CONTRAST Result Date: 02/15/2024 CLINICAL DATA:  Soft tissue mass, thigh, deep Large L leg hematoma after fall EXAM: CT OF THE LOWER LEFT EXTREMITY WITHOUT CONTRAST TECHNIQUE: Multidetector CT imaging of the lower left extremity was performed according to the standard protocol. RADIATION DOSE REDUCTION: This exam was performed according to the departmental  dose-optimization program which includes automated exposure control, adjustment of the mA and/or kV according to patient size and/or use of iterative reconstruction technique. COMPARISON:  X-ray left knee and hip 02/15/2024 FINDINGS: Bones/Joint/Cartilage No evidence of fracture, dislocation, or joint effusion of the left hip and knee. No evidence of severe arthropathy. At least moderate tricompartmental changes of the knee. Severe degenerative changes of the patellofemoral joint. Ligaments Suboptimally assessed by CT. Muscles and Tendons Unremarkable. Soft tissues Distal thigh and knee subcutaneus soft tissue edema with anteromedial 12x3.5x12 cm high density fluid collection suggestive of hematoma. Atherosclerotic plaque. IMPRESSION: 1. Distal thigh and knee subcutaneus soft tissue edema with anteromedial 12x3.5x12 cm hematoma. 2.  No acute displaced fracture or dislocation. Electronically Signed   By: Tish Frederickson M.D.   On: 02/15/2024 23:12   DG Knee Complete 4 Views Left Result Date: 02/15/2024 CLINICAL DATA:  Pain after fall. EXAM: LEFT KNEE - COMPLETE 4+ VIEW COMPARISON:  None Available. FINDINGS: No acute fracture. No dislocation. Mild osteoarthritis primarily in the patellofemoral compartment. No significant knee joint effusion. Generalized soft tissue edema. There are vascular calcifications. Phleboliths in the soft tissues anteriorly. Generalized soft tissue edema. IMPRESSION: 1. No acute fracture or subluxation of the left knee. 2. Mild osteoarthritis. Electronically Signed   By: Narda Rutherford M.D.   On: 02/15/2024 17:50   DG Hip Unilat W or Wo Pelvis 2-3 Views Left Result Date: 02/15/2024 CLINICAL DATA:  Left hip pain after fall. EXAM: DG HIP (WITH OR WITHOUT PELVIS) 2-3V LEFT COMPARISON:  None Available. FINDINGS: No acute fracture of the pelvis or left hip. No hip dislocation. Mild left hip osteoarthritis with acetabular spurring. Pubic rami are intact. Pubic symphysis and sacroiliac joints  are congruent. 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 IMPRESSION: 1. No acute fracture or subluxation of the left hip. 2. Mild left hip osteoarthritis. Electronically Signed   By: Narda Rutherford M.D.   On: 02/15/2024 17:48    Microbiology: Results for orders placed or performed in visit on 10/24/19  CULTURE, URINE COMPREHENSIVE     Status: None   Collection Time: 10/24/19  1:37 PM   UR  Result Value Ref Range Status   Urine Culture, Comprehensive Final report  Final   Organism  ID, Bacteria Comment  Final    Comment: No growth in 36 - 48 hours.    Labs: CBC: Recent Labs  Lab 02/15/24 2158 02/16/24 0035 02/16/24 1232 02/17/24 0018 02/17/24 0637 02/17/24 1233 02/18/24 1056  WBC 10.7*   < > 11.2* 11.0* 10.5 10.7* 10.8*  NEUTROABS 8.8*  --   --   --   --   --   --   HGB 9.7*   < > 10.1* 9.6* 9.7* 9.7* 10.2*  HCT 30.5*   < > 32.5* 29.6* 29.7* 30.8* 32.5*  MCV 100.0   < > 102.2* 100.3* 98.7 100.0 100.3*  PLT 331   < > 309 279 299 290 311   < > = values in this interval not displayed.   Basic Metabolic Panel: Recent Labs  Lab 02/15/24 2250 02/16/24 0027 02/16/24 0838 02/17/24 0637 02/18/24 1056  NA 139  --  142 142 142  K 5.3* 5.0 4.6 4.4 4.4  CL 109  --  109 111 111  CO2 20*  --  22 23 24   GLUCOSE 108*  --  90 125* 134*  BUN 51*  --  46* 35* 32*  CREATININE 1.43*  --  1.39* 1.27* 1.27*  CALCIUM 8.5*  --  8.9 8.8* 8.5*   Liver Function Tests: No results for input(s): "AST", "ALT", "ALKPHOS", "BILITOT", "PROT", "ALBUMIN" in the last 168 hours. CBG: Recent Labs  Lab 02/17/24 1553 02/17/24 2109 02/18/24 0755 02/18/24 1043 02/18/24 1145  GLUCAP 104* 133* 136* 128* 129*    Discharge time spent: greater than 30 minutes.  Signed: Lucile Shutters, [name] [facility] 02/18/2024

## 2024-02-18 NOTE — TOC Progression Note (Signed)
 Transition of Care [facility]) - Progression Note    Patient Details  Name: Roger [name] MRN: [id number] Date of Birth: [birth year]  Transition of Care [facility]) CM/SW Contact  Erin Sons, [place] Phone Number: 02/18/2024, 8:41 AM  Clinical Narrative:      SNF auth still pending at this time.                Social Determinants of Health (SDOH) Interventions SDOH Screenings   Food Insecurity: No Food Insecurity (02/16/2024)  Housing: Low Risk  (02/16/2024)  Transportation Needs: No Transportation Needs (02/16/2024)  Utilities: Not At Risk (02/16/2024)  Social Connections: Moderately Integrated (02/16/2024)  Tobacco Use: Medium Risk (02/16/2024)    Readmission Risk Interventions     No data to display

## 2024-02-18 NOTE — Anesthesia Preprocedure Evaluation (Addendum)
 Anesthesia Evaluation  Patient identified by MRN, date of birth, ID band Patient awake    Reviewed: Allergy & Precautions, NPO status , Patient's Chart, lab work & pertinent test results  History of Anesthesia Complications Negative for: history of anesthetic complications  Airway Mallampati: III   Neck ROM: Full    Dental  (+) Missing   Pulmonary former smoker (quit 12/2020)   Pulmonary exam normal breath sounds clear to auscultation       Cardiovascular hypertension, + CAD (s/p MI and stents) and +CHF (EF 50-55%)  Normal cardiovascular exam+ dysrhythmias (a fib on Eliquis)  Rhythm:Regular Rate:Normal     Neuro/Psych negative neurological ROS     GI/Hepatic ,GERD  ,,  Endo/Other  diabetes, Type 2  Obesity   Renal/GU Renal disease (nephrolithiasis)     Musculoskeletal  (+) Arthritis ,    Abdominal   Peds  Hematology  (+) Blood dyscrasia, anemia   Anesthesia Other Findings   Reproductive/Obstetrics                             Anesthesia Physical Anesthesia Plan  ASA: 3  Anesthesia Plan: General   Post-op Pain Management:    Induction: Intravenous  PONV Risk Score and Plan: 2 and Ondansetron, Dexamethasone and Treatment may vary due to age or medical condition  Airway Management Planned: LMA  Additional Equipment:   Intra-op Plan:   Post-operative Plan: Extubation in OR  Informed Consent: I have reviewed the patients History and Physical, chart, labs and discussed the procedure including the risks, benefits and alternatives for the proposed anesthesia with the patient or authorized representative who has indicated his/her understanding and acceptance.     Dental advisory given  Plan Discussed with: CRNA  Anesthesia Plan Comments: (Patient consented for risks of anesthesia including but not limited to:  - adverse reactions to medications - damage to eyes, teeth, lips or  other oral mucosa - nerve damage due to positioning  - sore throat or hoarseness - damage to heart, brain, nerves, lungs, other parts of body or loss of life  Informed patient about role of CRNA in peri- and intra-operative care.  Patient voiced understanding.)       Anesthesia Quick Evaluation

## 2024-02-18 NOTE — Progress Notes (Signed)
 Progress Note   Patient: Roger [name] AOZ:[id number] DOB: 1946/08/11 DOA: 02/15/2024     2 DOS: the patient was seen and examined on 02/18/2024   Brief hospital course: Roger [name] is a 78 y.o. male with medical history significant of A fib on Eliquis, ventricular fibrillation, HTN, HLD, DM, CAD, dCHF, who presents with fall, left leg pain and swelling.   Patient states that he fell accidentally 4 days ago. The patient states that he had a mechanical fall from standing height onto his left knee. No LOC. No headache or neck pain. He developed swelling and left leg pain, which has been progressively worsening. He has bruise in left lower part of the thigh, upper part of the knee, ankle and foot, with weeping from a large skin tear along the knee. He has discoloration to the left foot. Per Dr. Elesa Massed of ED, pt has dopplerable pulse in left leg. He states that he has been able to walk and bear weight on the left leg since the fall.   Patient does not have chest pain, cough, SOB.  No nausea, vomiting, diarrhea or abdominal pain.  No symptoms of UTI.  He states that he took last dose Eliquis yesterday morning (02/15/24 morning).    Data reviewed independently and ED Course: pt was found to have Hgb dropped from 14.2 on 12/03/23 to 9.7, AKI with creatinine 1.43, BUN 51, GFR 51 (baseline creatinine 1.23 with a GFR> 60 on 12/03/2023), potassium 5.3 with hemolysis, pending repeated potassium, WBC 10.7.  Temperature normal, blood pressure 110/54, heart rate 91, 18, oxygen 99% on room air.  X-ray of the left knee and left hip negative for fracture. Pt is admitted to telemetry bed as inpatient.  Message sent to Dr. Everlene Farrier of surgery for consult.   CT of left femur showed large hematoma. 1. Distal thigh and knee subcutaneus soft tissue edema with anteromedial 12x3.5x12 cm hematoma. 2.  No acute displaced fracture or dislocation.   CT of head and CT of neck: Negative for acute injury.   X-ray of left ankle and  X-ray of left foot: Negative for fracture   Assessment and Plan:  Acute blood loss anemia due to hematoma of leg, left, initial encounter:  Traumatic and related to recent mechanical fall in the setting of chronic anticoagulation use Patient noted to have a drop in his hemoglobin from 14.2 to 9.7 and this has remained stable since admission Patient was on Eliquis and this was reversed on admission.  He received Kcentra H&H has remained stable Appreciate surgery and orthopedic input, patient is status post incision and drainage of complex left deep thigh hematoma. Excisional debridement of skin, subcutaneous tissue and muscle  measuring 4 square centimeters Surgery recommends daily packing with wrapping the knee and elevation  For possible discharge in a.m.     Mechanical fall at home, initial encounter -PT/OT evaluated patient and he will need continued PT at the skilled nursing facility -Fall precautions       Atrial fibrillation, chronic (HCC) -Eliquis was held due to significant hematoma and acute blood loss anemia and will be resumed on 02/21/24 -Continue metoprolol     CAD S/P percutaneous coronary angioplasty: No chest pain Continue Nexlizet and Repatha     Diabetes mellitus without complication [facility]):  Recent A1c 6.9, well-controlled.   Continue Jardiance Continue consistent carbohydrate diet     Chronic diastolic congestive heart failure (HCC):  Stable and not acutely exacerbated 2D echo on 04/19/2019 showed EF of  50-55%.   Continue metoprolol, spironolactone and enalapril      Essential hypertension, benign Continue metoprolol Resume enalapril and spironolactone on discharge       Hyperkalemia:  Resolved Potassium 5.3, likely due to hemolysis.       AKI (acute kidney injury) (HCC): -Renal function is back to baseline Resume spironolactone and enalapril on discharge       Obesity (BMI 30-39.9): Class I -Encourage losing weight -Exercise and  healthy diet           Subjective: Complains of pain at the site of his incision  Physical Exam: Vitals:   02/18/24 1043 02/18/24 1045 02/18/24 1113 02/18/24 1144  BP: 135/68 135/68 125/68 117/70  Pulse: 92 96 90 94  Resp: (!) 21 19 16 16   Temp:    98.2 F (36.8 C)  TempSrc:      SpO2: 97% 97% 97% 99%  Weight:      Height:       General: Not in acute distress HEENT:       Eyes: PERRL, EOMI, no jaundice       ENT: No discharge from the ears and nose, no pharynx injection, no tonsillar enlargement.        Neck: No JVD, no bruit, no mass felt. Heme: No neck lymph node enlargement. Cardiac: S1/S2, RRR, No murmurs, No gallops or rubs. Respiratory: No rales, wheezing, rhonchi or rubs. GI: Soft, nondistended, nontender, no rebound pain, no organomegaly, BS present. GU: No hematuria Ext: Ecchymosis of left knee, swelling with skin blistering over the distal thigh. Has chronic venous changes in right lowe legs.      Musculoskeletal: Decreased range of motion left knee and Skin: No rashes.  Neuro: Alert, oriented X3, cranial nerves II-XII grossly intact, moves all extremities. Psych: Patient is not psychotic, no suicidal or hemocidal ideation.     Data Reviewed: Labs reviewed.  Hemoglobin 10.2 There are no new results to review at this time.  Family Communication: Plan of care discussed with patient in detail. He verbalizes understanding and agrees with the plan  Disposition: Status is: Inpatient Remains inpatient appropriate because: Possible discharge in a.m.  Planned Discharge Destination: Skilled nursing facility    Time spent: 33 minutes  Author: Lucile Shutters, MD 02/18/2024 12:12 PM  For on call review [email or web address].

## 2024-02-18 NOTE — NC FL2 (Signed)
  MEDICAID FL2 LEVEL OF CARE FORM     IDENTIFICATION  Patient Name: Roger [name] Birthdate: [birth year] Sex: male Admission Date (Current Location): 02/15/2024  [facility] and [place] Number:  Producer, television/film/video and Address:  [facility], [address], [place], [place] [address]      Provider Number: [id number]  Attending Physician Name and Address:  Lucile Shutters, MD  Relative Name and Phone Number:  Southern,Richard (Brother)  [phone] [facility])    Current Level of Care: Hospital Recommended Level of Care: Skilled Nursing Facility Prior Approval Number:    Date Approved/Denied:   PASRR Number: [id number]  Discharge Plan: Home    Current Diagnoses: Patient Active Problem List   Diagnosis Date Noted   Acute blood loss anemia 02/16/2024   Fall at home, initial encounter 02/16/2024   Hematoma of leg, left, initial encounter 02/16/2024   Atrial fibrillation, chronic (HCC) 02/16/2024   Obesity (BMI 30-39.9) 02/16/2024   AKI (acute kidney injury) (HCC) 02/16/2024   Hyperkalemia 02/16/2024   Hematoma of left thigh 02/16/2024   Hypertension associated with diabetes (HCC) 12/03/2023   Type 2 diabetes mellitus with hyperglycemia, without long-term current use of insulin (HCC) 09/02/2023   Seasonal allergic rhinitis due to pollen 09/02/2023   Vitamin D deficiency 09/02/2023   Diabetes mellitus without complication (HCC) 02/22/2023   Gastroesophageal reflux disease without esophagitis 02/22/2023   HLD (hyperlipidemia) 02/22/2023   Chronic diastolic congestive heart failure (HCC) 02/22/2023   Longstanding persistent atrial fibrillation (HCC) 02/22/2023   Essential hypertension, benign 02/22/2023   Vitamin B12 deficiency 02/22/2023   CAD S/P percutaneous coronary angioplasty 02/22/2023   Hypotension 09/01/2019   Ventricular fibrillation (HCC) 04/19/2019    Orientation RESPIRATION BLADDER Height & Weight     Self, Time, Situation,  Place  Normal Continent Weight: 225 lb 15.5 oz (102.5 kg) Height:  5\' 8"  (172.7 cm)  BEHAVIORAL SYMPTOMS/MOOD NEUROLOGICAL BOWEL NUTRITION STATUS      Continent Diet (see d/c summary)  AMBULATORY STATUS COMMUNICATION OF NEEDS Skin   Extensive Assist Verbally Other (Comment) (incision left leg; abrasion left knee)                       Personal Care Assistance Level of Assistance  Bathing, Feeding, Dressing Bathing Assistance: Maximum assistance Feeding assistance: Independent Dressing Assistance: Limited assistance     Functional Limitations Info  Sight, Hearing, Speech Sight Info: Adequate Hearing Info: Adequate Speech Info: Adequate    SPECIAL CARE FACTORS FREQUENCY  OT (By licensed OT), PT (By licensed PT)     PT Frequency: 5x/week OT Frequency: 5x/week            Contractures Contractures Info: Not present    Additional Factors Info  Allergies, Code Status Code Status Info: full code Allergies Info: Amoxicillin, sulfa antibiotics, Sulfur Dioxide, ibuprofen, Penicillin G, Pravastatin, Rosuvastatin           Current Medications (02/18/2024):  This is the current hospital active medication list Current Facility-Administered Medications  Medication Dose Route Frequency Provider Last Rate Last Admin   acetaminophen (TYLENOL) tablet 650 mg  650 mg Oral Q6H PRN Pabon, Diego F, MD       ezetimibe (ZETIA) tablet 10 mg  10 mg Oral Daily Pabon, [place] F, MD   10 mg at 02/18/24 0817   hydrALAZINE (APRESOLINE) injection 5 mg  5 mg Intravenous Q2H PRN Pabon, Merri Ray, MD       insulin aspart (  novoLOG) injection 0-9 Units  0-9 Units Subcutaneous TID WC Pabon, Diego F, MD   1 Units at 02/18/24 1208   metoprolol succinate (TOPROL-XL) 24 hr tablet 50 mg  50 mg Oral Daily Pabon, [place] F, MD   50 mg at 02/18/24 0817   morphine (PF) 2 MG/ML injection 2 mg  2 mg Intravenous Q4H PRN Pabon, Diego F, MD       ondansetron (ZOFRAN) injection 4 mg  4 mg Intravenous Q8H PRN Pabon, Diego  F, MD   4 mg at 02/18/24 0934   oxyCODONE-acetaminophen (PERCOCET/ROXICET) 5-325 MG per tablet 1 tablet  1 tablet Oral Q4H PRN Pabon, Diego F, MD       pantoprazole (PROTONIX) EC tablet 40 mg  40 mg Oral Daily Pabon, Cecelia Byars F, MD   40 mg at 02/18/24 6045     Discharge Medications: Please see discharge summary for a list of discharge medications.  Relevant Imaging Results:  Relevant Lab Results:   Additional Information SSN [id number] [place], [place]

## 2024-02-19 ENCOUNTER — Encounter: Payer: Self-pay | Admitting: Surgery

## 2024-02-19 DIAGNOSIS — D62 Acute posthemorrhagic anemia: Secondary | ICD-10-CM | POA: Diagnosis not present

## 2024-02-19 LAB — GLUCOSE, CAPILLARY
Glucose-Capillary: 120 mg/dL — ABNORMAL HIGH (ref 70–99)
Glucose-Capillary: 148 mg/dL — ABNORMAL HIGH (ref 70–99)

## 2024-02-19 NOTE — TOC Progression Note (Signed)
 Transition of Care [facility]) - Progression Note    Patient Details  Name: Roger [name] MRN: [id number] Date of Birth: 1946/04/24  Transition of Care [facility]) CM/SW Contact  Bing Quarry, RN Phone Number: 02/19/2024, 3:01 PM  Clinical Narrative:  3/15: Patient is appealing discharge and was given Medicare appeal phone numbers.  Left Message with Cala Bradford at [place], at [phone]. Unit RN confirmed patient was calling to appeal discharge today.    Gabriel Cirri MSN RN CM  RN Case Manager [place]  Transitions of Care Direct Dial: [phone] (Weekends Only) [facility] Main Office Phone: [phone] [facility] Fax: [phone] [email or web address]       Barriers to Discharge: Barriers Resolved  Expected Discharge Plan and Services         Expected Discharge Date: 02/19/24               DME Arranged: N/A DME Agency: NA       HH Arranged: NA HH Agency: NA         Social Determinants of Health (SDOH) Interventions SDOH Screenings   Food Insecurity: No Food Insecurity (02/16/2024)  Housing: Low Risk  (02/16/2024)  Transportation Needs: No Transportation Needs (02/16/2024)  Utilities: Not At Risk (02/16/2024)  Social Connections: Moderately Integrated (02/16/2024)  Tobacco Use: Medium Risk (02/18/2024)    Readmission Risk Interventions     No data to display

## 2024-02-19 NOTE — Discharge Instructions (Signed)
 Daily pack the left medial knee wound with 2 inch wide or 3 inch wide Kling, moistened with normal saline solution.  Cover the wound and the areas of epidermal loss with Xeroform gauze.  Apply ABD pad and secure with Kerlix roll and Ace wrap to secure.

## 2024-02-19 NOTE — Progress Notes (Signed)
 [facility] SURGICAL PROGRESS NOTE  Hospital Day(s): 3.   Post op day(s): 1 Day Post-Op.   Interval History: Patient seen and examined, no acute events or new complaints overnight. Patient reports reasonable pain control, and confidence that he will obtain the adequate wound care he needs at the [facility] [place].  Has some concern about some secondary spontaneous movement of the distal leg.    Review of Systems:  Constitutional: denies fever, chills  Respiratory: denies any shortness of breath  Cardiovascular: denies chest pain or palpitations  Gastrointestinal: denies abdominal pain, N/V, or diarrhea/and bowel function as per interval history Musculoskeletal: denies pain, decreased motor or sensation Integumentary: denies any other rashes or skin discolorations except that noted in the picture that I took during his dressing change.  Vital signs in last 24 hours: [min-max] current  Temp:  [97.8 F (36.6 C)-98.8 F (37.1 C)] 98 F (36.7 C) (03/15 1218) Pulse Rate:  [76-99] 97 (03/15 0826) Resp:  [15-20] 20 (03/15 1218) BP: (100-130)/(54-75) 100/75 (03/15 1218) SpO2:  [94 %-99 %] 95 % (03/15 1218)     Height: 5\' 8"  (172.7 cm) Weight: 102.5 kg BMI (Calculated): 34.37   Intake/Output last 2 shifts:  03/14 0701 - 03/15 0700 In: 350 [I.V.:200; IV Piggyback:150] Out: 575 [Urine:575]   Physical Exam:  Constitutional: alert, cooperative and no distress  Respiratory: breathing non-labored at rest    Integumentary:  The wound is clean, all tissues appear well-perfused.  Superficial epidermal ulcerations noted, no nonviable tissues, no malodor, no residual oozing or discharge.  Labs:     Latest Ref Rng & Units 02/18/2024   10:56 AM 02/17/2024   12:33 PM 02/17/2024    6:37 AM  CBC  WBC 4.0 - 10.5 K/uL 10.8  10.7  10.5   Hemoglobin 13.0 - 17.0 g/dL 78.2  9.7  9.7   Hematocrit 39.0 - 52.0 % 32.5  30.8  29.7   Platelets 150 - 400 K/uL 311  290  299       Latest  Ref Rng & Units 02/18/2024   10:56 AM 02/17/2024    6:37 AM 02/16/2024    8:38 AM  CMP  Glucose 70 - 99 mg/dL 956  213  90   BUN 8 - 23 mg/dL 32  35  46   Creatinine 0.61 - 1.24 mg/dL 0.86  5.78  4.69   Sodium 135 - 145 mmol/L 142  142  142   Potassium 3.5 - 5.1 mmol/L 4.4  4.4  4.6   Chloride 98 - 111 mmol/L 111  111  109   CO2 22 - 32 mmol/L 24  23  22    Calcium 8.9 - 10.3 mg/dL 8.5  8.8  8.9      Imaging studies: No new pertinent imaging studies   Assessment/Plan:  78 y.o. male with  1 Day Post-Op s/p I&D of hematoma left medial knee area for traumatic hematoma, complicated by pertinent comorbidities including  Patient Active Problem List   Diagnosis Date Noted   Acute blood loss anemia 02/16/2024   Fall at home, initial encounter 02/16/2024   Hematoma of leg, left, initial encounter 02/16/2024   Atrial fibrillation, chronic (HCC) 02/16/2024   Obesity (BMI 30-39.9) 02/16/2024   AKI (acute kidney injury) (HCC) 02/16/2024   Hyperkalemia 02/16/2024   Hematoma of left thigh 02/16/2024   Hypertension associated with diabetes (HCC) 12/03/2023   Type 2 diabetes mellitus with hyperglycemia, without long-term current use of insulin (HCC) 09/02/2023  Seasonal allergic rhinitis due to pollen 09/02/2023   Vitamin D deficiency 09/02/2023   Diabetes mellitus without complication (HCC) 02/22/2023   Gastroesophageal reflux disease without esophagitis 02/22/2023   HLD (hyperlipidemia) 02/22/2023   Chronic diastolic congestive heart failure (HCC) 02/22/2023   Longstanding persistent atrial fibrillation (HCC) 02/22/2023   Essential hypertension, benign 02/22/2023   Vitamin B12 deficiency 02/22/2023   CAD S/P percutaneous coronary angioplasty 02/22/2023   Hypotension 09/01/2019   Ventricular fibrillation (HCC) 04/19/2019    -I believe his wound care can be managed satisfactory as an outpatient.  He would need packing of the wound with 2 inch wide or 3 inch wide Kling, moistened with  normal saline solution.  Cover the wound and the areas of epidermal loss with Xeroform gauze.  Apply ABD pad and secure with Kerlix roll and Ace wrap to secure.  -    -May proceed with discharge as planned.   All of the above findings and recommendations were discussed with the patient, and all of patient's questions were answered to their expressed satisfaction.  -- Campbell Lerner, M.D., [facility] 02/19/2024

## 2024-02-19 NOTE — TOC Transition Note (Addendum)
 Transition of Care [facility]) - Discharge Note   Patient Details  Name: Roger [name] MRN: [id number] Date of Birth: [birth year]  Transition [facility]) CM/SW Contact:  Bing Quarry, RN Phone Number: 02/19/2024, 10:46 AM   Clinical Narrative:  3/15: Pending discharge and transfer to [facility] STR[facility]. Was admitted from ILF at [facility]. EMS forms sent to unit. DC Summary needs updated to today, provider notified, Unit RN updated. TOC will contact EMS for transport when ready.   DC Summary inbasketed to facility and left a VM with Cala Bradford the Admission Coordinator to all RN CM back.    Gabriel Cirri MSN RN CM  RN Case Manager [place]  Transitions of Care Direct Dial: [phone] (Weekends Only) [facility] Main Office Phone: [phone] [facility] Fax: [phone] .[email or web address]      Final next level of care: Skilled Nursing Facility Barriers to Discharge: Barriers Resolved   Patient Goals and CMS Choice            Discharge Placement                Patient to be transferred to facility by: [facility]      Discharge Plan and Services Additional resources added to the After Visit Summary for                  DME Arranged: N/A DME Agency: NA       HH Arranged: NA HH Agency: NA        Social Drivers of Health (SDOH) Interventions SDOH Screenings   Food Insecurity: No Food Insecurity (02/16/2024)  Housing: Low Risk  (02/16/2024)  Transportation Needs: No Transportation Needs (02/16/2024)  Utilities: Not At Risk (02/16/2024)  Social Connections: Moderately Integrated (02/16/2024)  Tobacco Use: Medium Risk (02/18/2024)     Readmission Risk Interventions     No data to display

## 2024-02-19 NOTE — TOC Transition Note (Signed)
 Transition of Care [facility]) - Discharge Note   Patient Details  Name: Roger [name] MRN: [id number] Date of Birth: 03/29/1946  Transition of Care [facility]) CM/SW Contact:  Bing Quarry, RN Phone Number: 02/19/2024, 3:26 PM   Clinical Narrative:  3/15: Patient decided not to appeal and contacted transport at [facility] himself. Obtained transport person's number, Onalee Hua, at [phone], and he gave RN CM unit number to call report at [phone]. RN CM called to confirm with Delice Bison,  then gave number to unit RN. Informed Onalee Hua to speak to Unit RN before leaving with patient and ensuring proper hand off has been completed.    Gabriel Cirri MSN RN CM  RN Case Manager [place]  Transitions of Care Direct Dial: [phone] (Weekends Only) [facility] Main Office Phone: [phone] [facility] Fax: [phone] [email or web address]      Final next level of care: Skilled Nursing Facility Barriers to Discharge: Barriers Resolved   Patient Goals and CMS Choice            Discharge Placement                Patient to be transferred to facility by: [facility]      Discharge Plan and Services Additional resources added to the After Visit Summary for                  DME Arranged: N/A DME Agency: NA       HH Arranged: NA HH Agency: NA        Social Drivers of Health (SDOH) Interventions SDOH Screenings   Food Insecurity: No Food Insecurity (02/16/2024)  Housing: Low Risk  (02/16/2024)  Transportation Needs: No Transportation Needs (02/16/2024)  Utilities: Not At Risk (02/16/2024)  Social Connections: Moderately Integrated (02/16/2024)  Tobacco Use: Medium Risk (02/18/2024)     Readmission Risk Interventions     No data to display

## 2024-02-19 NOTE — Progress Notes (Addendum)
 Physical Therapy Treatment Patient Details Name: Roger [name] MRN: [id number] DOB: 1946/10/18 Today's Date: 02/19/2024   History of Present Illness Pt is a 78 y.o. male presenting to hospital 02/15/24 with c/o L leg pain and swelling after a fall 4 days prior; superficial skin tear of knee and ecchymosis tracing into distal leg and foot noted (L).  Pt admitted with acute blood loss anemia d/t hematoma of L leg, fall, hyperkalemai, AKI. S/p I&D of complex L deep thigh hematoma and excisional debridement of skin subcutaneous tissue and muscle.  PMH includes a-fib on Eliquis, ventricular fibrillation, htn, HLD, DM, CAD, dCHF, R hip replacement.    PT Comments  Pt tolerated session fair today, and was able to improve overall assist levels, pain levels, and functional mobility tolerance since IE. Pt continues to have c/o increased LLE pain with WB that is alleviated with rest; pain limiting tolerance for upright functional mobility. Pt able to complete bed mobility with supervision, transfers with min assist, and minimal gait with min assist in RW. Education provided for pain and edema management, including modified WB through LLE for improved tolerance in WB. With modified WB, pt able to facilitate multiple small steps before requiring seated rest break.  Despite progress, he continues to be limited with meeting goals secondary to increased pain levels, decreased activity tolerance, functional weakness, and impaired skin integrity/edema of LLE. Pt will continue to benefit from skilled acute PT services to address deficits and improve overall safety with functional mobility. Will continue per POC.    If plan is discharge home, recommend the following: Two people to help with walking and/or transfers;A lot of help with bathing/dressing/bathroom;Assistance with cooking/housework;Assist for transportation;Help with stairs or ramp for entrance   Can travel by private vehicle     No  Equipment  Recommendations  Rolling walker (2 wheels);BSC/3in1;Wheelchair (measurements PT);Wheelchair cushion (measurements PT)       Precautions / Restrictions Restrictions Weight Bearing Restrictions Per Provider Order: No LLE Weight Bearing Per Provider Order: Weight bearing as tolerated Other Position/Activity Restrictions: confirmed WBAT per attending 3/15     Mobility  Bed Mobility Overal bed mobility: Needs Assistance Bed Mobility: Supine to Sit, Sit to Supine     Supine to sit: Supervision, HOB elevated Sit to supine: Supervision   General bed mobility comments: supervision for safety to complete supine<>sit transfers, increased time/effort, cues for hand placement and sequencing. Able to scoot posteriorly towards [facility] with BUE support on bed rail, RLE in hooklying, and bed in trendelenburg position. Cues for safety and sequencing.    Transfers Overall transfer level: Needs assistance Equipment used: Rolling walker (2 wheels) Transfers: Sit to/from Stand Sit to Stand: Min assist           General transfer comment: Min assist for power to stand from EOB and for controlled descent with sitting, using RW. Verbal cues for safety, sequencing, hand placement, and alternate WB (TTWB vs heel WB) for improved pain management.    Ambulation/Gait Ambulation/Gait assistance: Min assist Gait Distance (Feet): 2 Feet Assistive device: Rolling walker (2 wheels)         General Gait Details: Takes multiple small shuffled steps to the R at EOB with RW. Max cues for safety, sequencing, hand placement, and RW management. Further mobility limited secondary to increased pain in LLE with WB that is alleviated at rest.      Balance Overall balance assessment: Needs assistance Sitting-balance support: No upper extremity supported, Feet supported Sitting balance-Leahy Scale: Good Sitting  balance - Comments: steady reaching within BOS   Standing balance support: Bilateral upper extremity  supported, During functional activity, Reliant on assistive device for balance Standing balance-Leahy Scale: Poor Standing balance comment: assist to steady in standing with RW use                            Communication Communication Communication: No apparent difficulties  Cognition Arousal: Alert Behavior During Therapy: WFL for tasks assessed/performed   PT - Cognitive impairments: No apparent impairments                       PT - Cognition Comments: Pt talkative; not always direct with answers (tangential) Following commands: Intact      Cueing Cueing Techniques: Verbal cues  Exercises Other Exercises Other Exercises: Pt educated re: PT Role/POC, DC recommendations, WBAT vs modified WB for pain tolerance, OOB to chair, benefits of mobility, edema management, pain management. He verbalized understanding.    General Comments General comments (skin integrity, edema, etc.): bruising noted along LLE with L knee bandage in place. Significant bruising and edema of L ankle/foot/toes not formally assessed due to increased reports of pain with WB/touch      Pertinent Vitals/Pain Pain Assessment Pain Assessment: Faces Faces Pain Scale: Hurts even more Pain Location: L knee and foot in WB; 0/10 at rest Pain Descriptors / Indicators: Tender, Discomfort, Grimacing, Guarding Pain Intervention(s): Limited activity within patient's tolerance, Monitored during session, Repositioned, Relaxation (pt declining need for pain medication pre/post session)     PT Goals (current goals can now be found in the care plan section) Acute Rehab PT Goals Patient Stated Goal: to improve pain, strength, and walking PT Goal Formulation: With patient Time For Goal Achievement: 03/02/24 Potential to Achieve Goals: Good Progress towards PT goals: Progressing toward goals    Frequency    Min 2X/week       AM-PAC PT "6 Clicks" Mobility   Outcome Measure  Help needed turning  from your back to your side while in a flat bed without using bedrails?: None Help needed moving from lying on your back to sitting on the side of a flat bed without using bedrails?: None Help needed moving to and from a bed to a chair (including a wheelchair)?: A Lot Help needed standing up from a chair using your arms (e.g., wheelchair or bedside chair)?: A Little Help needed to walk in hospital room?: A Lot Help needed climbing 3-5 steps with a railing? : Total 6 Click Score: 16    End of Session Equipment Utilized During Treatment: Gait belt Activity Tolerance: Patient limited by pain Patient left: in bed;with call bell/phone within reach;with bed alarm set Nurse Communication: Mobility status;Precautions PT Visit Diagnosis: Other abnormalities of gait and mobility (R26.89);Muscle weakness (generalized) (M62.81);History of falling (Z91.81);Pain Pain - Right/Left: Left Pain - part of body: Knee;Ankle and joints of foot     Time: 0922-0952 PT Time Calculation (min) (ACUTE ONLY): 30 min  Charges:    $Therapeutic Activity: 23-37 mins PT General Charges $$ ACUTE PT VISIT: 1 Visit                       Vira Blanco, PT, DPT 10:16 AM,02/19/24 Physical Therapist - [facility]

## 2024-02-23 LAB — AEROBIC/ANAEROBIC CULTURE W GRAM STAIN (SURGICAL/DEEP WOUND): Gram Stain: NONE SEEN

## 2024-03-02 ENCOUNTER — Ambulatory Visit: Payer: Medicare Other | Admitting: Internal Medicine

## 2024-03-02 ENCOUNTER — Other Ambulatory Visit: Payer: Self-pay

## 2024-03-02 ENCOUNTER — Encounter: Payer: Self-pay | Admitting: *Deleted

## 2024-03-02 ENCOUNTER — Emergency Department
Admission: EM | Admit: 2024-03-02 | Discharge: 2024-03-02 | Disposition: A | Discharge status: Skilled Nursing Facility | Attending: Emergency Medicine | Admitting: Emergency Medicine

## 2024-03-02 ENCOUNTER — Emergency Department

## 2024-03-02 DIAGNOSIS — L03116 Cellulitis of left lower limb: Secondary | ICD-10-CM | POA: Insufficient documentation

## 2024-03-02 DIAGNOSIS — M7989 Other specified soft tissue disorders: Secondary | ICD-10-CM | POA: Diagnosis present

## 2024-03-02 DIAGNOSIS — I2510 Atherosclerotic heart disease of native coronary artery without angina pectoris: Secondary | ICD-10-CM | POA: Insufficient documentation

## 2024-03-02 DIAGNOSIS — I5030 Unspecified diastolic (congestive) heart failure: Secondary | ICD-10-CM | POA: Diagnosis not present

## 2024-03-02 DIAGNOSIS — Z7901 Long term (current) use of anticoagulants: Secondary | ICD-10-CM | POA: Insufficient documentation

## 2024-03-02 DIAGNOSIS — E119 Type 2 diabetes mellitus without complications: Secondary | ICD-10-CM | POA: Insufficient documentation

## 2024-03-02 LAB — CBC WITH DIFFERENTIAL/PLATELET
Abs Immature Granulocytes: 0.05 10*3/uL (ref 0.00–0.07)
Basophils Absolute: 0.1 10*3/uL (ref 0.0–0.1)
Basophils Relative: 1 %
Eosinophils Absolute: 0.1 10*3/uL (ref 0.0–0.5)
Eosinophils Relative: 1 %
HCT: 37 % — ABNORMAL LOW (ref 39.0–52.0)
Hemoglobin: 11.5 g/dL — ABNORMAL LOW (ref 13.0–17.0)
Immature Granulocytes: 1 %
Lymphocytes Relative: 13 %
Lymphs Abs: 1.1 10*3/uL (ref 0.7–4.0)
MCH: 31.7 pg (ref 26.0–34.0)
MCHC: 31.1 g/dL (ref 30.0–36.0)
MCV: 101.9 fL — ABNORMAL HIGH (ref 80.0–100.0)
Monocytes Absolute: 0.5 10*3/uL (ref 0.1–1.0)
Monocytes Relative: 6 %
Neutro Abs: 6.3 10*3/uL (ref 1.7–7.7)
Neutrophils Relative %: 78 %
Platelets: 353 10*3/uL (ref 150–400)
RBC: 3.63 MIL/uL — ABNORMAL LOW (ref 4.22–5.81)
RDW: 15.1 % (ref 11.5–15.5)
WBC: 8.1 10*3/uL (ref 4.0–10.5)
nRBC: 0 % (ref 0.0–0.2)

## 2024-03-02 LAB — COMPREHENSIVE METABOLIC PANEL WITH GFR
ALT: 12 U/L (ref 0–44)
AST: 16 U/L (ref 15–41)
Albumin: 3.2 g/dL — ABNORMAL LOW (ref 3.5–5.0)
Alkaline Phosphatase: 55 U/L (ref 38–126)
Anion gap: 8 (ref 5–15)
BUN: 26 mg/dL — ABNORMAL HIGH (ref 8–23)
CO2: 26 mmol/L (ref 22–32)
Calcium: 8.9 mg/dL (ref 8.9–10.3)
Chloride: 103 mmol/L (ref 98–111)
Creatinine, Ser: 1.2 mg/dL (ref 0.61–1.24)
GFR, Estimated: >60 mL/min (ref >60)
Glucose, Bld: 142 mg/dL — ABNORMAL HIGH (ref 70–99)
Potassium: 4.1 mmol/L (ref 3.5–5.1)
Sodium: 137 mmol/L (ref 135–145)
Total Bilirubin: 1.1 mg/dL (ref 0.0–1.2)
Total Protein: 6.5 g/dL (ref 6.5–8.1)

## 2024-03-02 LAB — LACTIC ACID, PLASMA: Lactic Acid, Venous: 1.4 mmol/L (ref 0.5–1.9)

## 2024-03-02 MED ORDER — ACETAMINOPHEN 500 MG PO TABS
1000.0000 mg | ORAL_TABLET | Freq: Once | ORAL | Status: AC
  Administered 2024-03-02: 1000 mg via ORAL
  Filled 2024-03-02: qty 2

## 2024-03-02 MED ORDER — CEPHALEXIN 500 MG PO CAPS: 500.0000 mg | ORAL_CAPSULE | Freq: Three times a day (TID) | ORAL | 0 refills | Status: DC

## 2024-03-02 MED ORDER — CEPHALEXIN 500 MG PO CAPS
500.0000 mg | ORAL_CAPSULE | Freq: Three times a day (TID) | ORAL | 0 refills | Status: AC
Start: 2024-03-02 — End: 2024-03-09

## 2024-03-02 MED ORDER — SODIUM CHLORIDE 0.9 % IV SOLN
1.0000 g | Freq: Once | INTRAVENOUS | Status: AC
  Administered 2024-03-02: 1 g via INTRAVENOUS
  Filled 2024-03-02: qty 10

## 2024-03-02 NOTE — Discharge Instructions (Addendum)
 No signs of blood clots in the left leg.  Blood work is okay.  Provided a dose of ceftriaxone in the ED that lasted about 24 hours  Start Keflex antibiotics orally 3 times daily for the next week.  Return to the ED with any worsening symptoms.  Continue wound care and wet-to-dry dressings

## 2024-03-02 NOTE — ED Provider Notes (Signed)
 [facility] Provider Note    Event Date/Time   First MD Initiated Contact with Patient 03/02/24 1637     (approximate)   History   Cellulitis   HPI  Roger [name] is a 78 y.o. male who presents to the ED for evaluation of Cellulitis   I reviewed medical DC summary from 12 days ago.  History of A-fib on Eliquis, diastolic CHF, CAD, DM who was admitted for a large hematoma of his left leg, distal thigh and around the knee.  Observed for his traumatic hematoma, hemoglobin drop 14 --> 9.7 and subsequently stable.  Eliquis reversed with Kcentra.  Had an I&D of this hematoma.  Patient resides at a local SNF, the [address] of [place].  I review his MAR from the facility indicating that he has been on Eliquis 2.5 mg twice daily.   He presents to the ED due to atraumatic erythema and swelling of his left lower leg.  Reports they have been changing the dressings to his I&D site reliably and this area continues to heal and improve.  Denies any fevers, falls or trauma, systemic symptoms.  Reports he feels fine.   Physical Exam   Triage Vital Signs: ED Triage Vitals [03/02/24 1442]  Encounter Vitals Group     BP (!) 103/57     Systolic BP Percentile      Diastolic BP Percentile      Pulse Rate 88     Resp      Temp 98 F (36.7 C)     Temp Source Oral     SpO2 100 %     Weight      Height      Head Circumference      Peak Flow      Pain Score 0     Pain Loc      Pain Education      Exclude from Growth Chart     Most recent vital signs: Vitals:   03/02/24 1442 03/02/24 1855  BP: (!) 103/57 112/64  Pulse: 88 75  Resp:  18  Temp: 98 F (36.7 C) 97.9 F (36.6 C)  SpO2: 100% 99%    General: Awake, no distress.  CV:  Good peripheral perfusion.  Resp:  Normal effort.  Abd:  No distention.  MSK:  No deformity noted.  Neuro:  No focal deficits appreciated. Other:  I take down a clean dressing over his I&D site to the medial left knee and his  lower leg as pictured below.  Packing to I&D site in place, no evidence of superimposed infection.  Distal to this, to the lower leg is soft tissue swelling and mild erythema.  Slightly warm to the touch but not hot.     ED Results / Procedures / Treatments   Labs (all labs ordered are listed, but only abnormal results are displayed) Labs Reviewed  COMPREHENSIVE METABOLIC PANEL WITH GFR - Abnormal; Notable for the following components:      Result Value   Glucose, Bld 142 (*)    BUN 26 (*)    Albumin 3.2 (*)    All other components within normal limits  CBC WITH DIFFERENTIAL/PLATELET - Abnormal; Notable for the following components:   RBC 3.63 (*)    Hemoglobin 11.5 (*)    HCT 37.0 (*)    MCV 101.9 (*)    All other components within normal limits  LACTIC ACID, PLASMA  URINALYSIS, W/ REFLEX TO CULTURE (INFECTION SUSPECTED)  EKG   RADIOLOGY Venous ultrasound left leg interpreted by me without clear signs of DVT.  Official radiology report(s): US Venous Img Lower Unilateral Left Result Date: 03/02/2024 CLINICAL DATA:  Swelling and redness. EXAM: LEFT LOWER EXTREMITY VENOUS DOPPLER ULTRASOUND TECHNIQUE: Gray-scale sonography with compression, as well as color and duplex ultrasound, were performed to evaluate the deep venous system(s) from the level of the common femoral vein through the popliteal and proximal calf veins. COMPARISON:  Left lower extremity venous Doppler ultrasound 02/16/2024 FINDINGS: VENOUS Normal compressibility of the common femoral, superficial femoral, and popliteal veins, as well as the visualized calf veins. Visualized portions of profunda femoral vein and great saphenous vein unremarkable. No filling defects to suggest DVT on grayscale or color Doppler imaging. Doppler waveforms show normal direction of venous flow, normal respiratory plasticity and response to augmentation. Limited views of the contralateral common femoral vein are unremarkable. OTHER None.  Limitations: Limited evaluation of popliteal and calf veins secondary to patient being unable to tolerate compression. IMPRESSION: No evidence of left lower extremity DVT. Limited evaluation of popliteal and calf veins secondary to patient being unable to tolerate compression. Electronically Signed   By: Darliss Cheney M.D.   On: 03/02/2024 18:41    PROCEDURES and INTERVENTIONS:  Procedures  Medications  acetaminophen (TYLENOL) tablet 1,000 mg (1,000 mg Oral Given 03/02/24 1722)  cefTRIAXone (ROCEPHIN) 1 g in sodium chloride 0.9 % 100 mL IVPB (0 g Intravenous Stopped 03/02/24 1852)     IMPRESSION / MDM / ASSESSMENT AND PLAN / ED COURSE  I reviewed the triage vital signs and the nursing notes.  Differential diagnosis includes, but is not limited to, DVT, cellulitis, sepsis, deep space infection, evolving hematoma  {Patient presents with symptoms of an acute illness or injury that is potentially life-threatening.  Asymptomatic 78 year old presents to the ED with swelling and erythema to his left lower leg as pictured.  Hemoglobin continues to improve, no leukocytosis or lactic acidosis.  No signs of sepsis or systemic illness.  Due to his recent interventions, although he is on Eliquis, we will obtain a DVT study and provide antibiotic to treat for cellulitis.  Clinical Course as of 03/02/24 1935  Thu Mar 02, 2024  1928 I consult with pharmacy about antibiotics, allergies. He's tolerated ceftriaxone, but has documented allergy to amoxicillin anaphylaxis.  [DS]  1934 Reassessed.  He is doing well.  We discussed workup that is reassuring and outpatient management with antibiotics.  He is agreeable. [DS]    Clinical Course User Index [DS] Delton Prairie, MD     FINAL CLINICAL IMPRESSION(S) / ED DIAGNOSES   Final diagnoses:  Cellulitis of left lower extremity     Rx / DC Orders   ED Discharge Orders          Ordered    cephALEXin (KEFLEX) 500 MG capsule  3 times daily,   Status:   Discontinued        03/02/24 1929    cephALEXin (KEFLEX) 500 MG capsule  3 times daily        03/02/24 1933             Note:  This document was prepared using Dragon voice recognition software and may include unintentional dictation errors.   Delton Prairie, MD 03/02/24 [phone]

## 2024-03-02 NOTE — ED Triage Notes (Signed)
 First Nurse Note:  Pt via [facility] from [facility].Pt had a LLE vein procedure 2 weeks ago, c/o swelling and redness since yesterday. Possible cellulitis. States the pain is worse when he standing. Pt is A&Ox4 and NAD 128/60 BP  99% on RA 80 HR

## 2024-03-02 NOTE — ED Triage Notes (Signed)
 Left lower extremity swelling, see first nurse note.  Pt is here for rule out cellulitis.

## 2024-03-02 NOTE — ED Notes (Signed)
 Patient to ultrasound at this time.

## 2024-03-08 ENCOUNTER — Encounter: Payer: Self-pay | Admitting: Physician Assistant

## 2024-03-08 ENCOUNTER — Ambulatory Visit (INDEPENDENT_AMBULATORY_CARE_PROVIDER_SITE_OTHER): Admitting: Physician Assistant

## 2024-03-08 VITALS — BP 103/66 | HR 86 | Ht 68.0 in | Wt 222.0 lb

## 2024-03-08 DIAGNOSIS — Z09 Encounter for follow-up examination after completed treatment for conditions other than malignant neoplasm: Secondary | ICD-10-CM

## 2024-03-08 DIAGNOSIS — S7012XD Contusion of left thigh, subsequent encounter: Secondary | ICD-10-CM

## 2024-03-08 DIAGNOSIS — S8012XA Contusion of left lower leg, initial encounter: Secondary | ICD-10-CM

## 2024-03-08 NOTE — Progress Notes (Signed)
 [facility] POST-OP OFFICE VISIT  03/08/2024  HPI: Roger [name] is a 78 y.o. male 19 days s/p incision and debridement of LLE hematoma with Dr Everlene Farrier  He did have ED visit on 03/27 for cellulitis and was discharged home on Keflex  Today, he reports he is doing well. Swelling and skin changes to the left medial thigh/knee are markedly improved. Wound now approximately 2 x 3 cm with minimal depth. No fever, chills. He reports his LLE erythema is improved as well since ED visit.   Vital signs: BP 103/66   Pulse 86   Ht 5\' 8"  (1.727 m)   Wt 222 lb (100.7 kg)   SpO2 99%   BMI 33.75 kg/m    Physical Exam: Constitutional: Well appearing male, sitting in wheelchair, NAD Skin: Approximately 2 x 3 cm wound to the medial thigh/knee, wound bad is 100% granulation tissue, scant punctate bleeding, no erythema. Previous swelling resolved. Previous skin blistering resolved.  MSK: LLE remains erythematous but seems to be improving, there remains trace edema.   Assessment/Plan: This is a 78 y.o. male 19 days s/p incision and debridement of LLE hematoma with Dr Everlene Farrier   - Continue Keflex as prescribed by EDP  - Reviewed LLE [place]; Negative for DVT, on anticoagulation at baseline  - Continue local wound care; Pack daily with gauze, cover, secure with ACE wrap  - He can follow up every 2-3 weeks until this heals. He was encouraged to call with questions/concerns in the interim   -- Lynden Oxford, PA-C [facility] 03/08/2024, 2:25 PM M-F: 7am - 4pm

## 2024-03-08 NOTE — Patient Instructions (Signed)
 Continue daily dressing changes as scheduled.  Follow up here in 2 weeks for another recheck of your leg.

## 2024-03-22 ENCOUNTER — Ambulatory Visit: Admitting: Surgery

## 2024-03-22 ENCOUNTER — Encounter: Payer: Self-pay | Admitting: Surgery

## 2024-03-22 VITALS — BP 103/68 | HR 93 | Temp 97.7°F | Ht 68.0 in | Wt 213.4 lb

## 2024-03-22 DIAGNOSIS — Z09 Encounter for follow-up examination after completed treatment for conditions other than malignant neoplasm: Secondary | ICD-10-CM

## 2024-03-22 DIAGNOSIS — S7012XD Contusion of left thigh, subsequent encounter: Secondary | ICD-10-CM

## 2024-03-22 NOTE — Patient Instructions (Signed)
 Incision and Drainage, Care After After incision and drainage, it is common to have: Pain or discomfort around the incision site. Blood, fluid, or pus (drainage) from the incision. Redness and firm skin around the incision site. Follow these instructions at home: Medicines Take over-the-counter and prescription medicines only as told by your health care provider. If you were prescribed antibiotics, take them as told by your provider. Do not stop using the antibiotic even if you start to feel better. Do not apply creams, ointments, or liquids unless you have been told to by your provider. Wound care Follow instructions from your provider about how to take care of your wound. Make sure you: Wash your hands with soap and water for at least 20 seconds before and after you change your bandage (dressing). If soap and water are not available, use hand sanitizer. Change your dressing and any packing as told by your provider. If the dressing is dry or stuck when you try to remove it, moisten or wet it with saline or water. This will help you remove it without harming your skin or tissues. If your wound is packed, leave it in place until your provider tells you to remove it. To remove it, moisten or wet the packing with saline or water. Leave stitches (sutures), skin glue, or tape strips in place. These skin closures may need to stay in place for 2 weeks or longer. If tape strip edges start to loosen and curl up, you may trim the loose edges. Do not remove tape strips completely unless your provider tells you to do that. Check your wound every day for signs of infection. Check for: More redness, swelling, or pain. More fluid or blood. Warmth. Pus or a bad smell. If you were sent home with a drain tube in place, follow instructions from your provider about: How to empty it. How to care for it at home. Be careful when you get rid of used dressings, wound packing, or drainage. Activity Rest the  affected area. Return to your normal activities as told by your provider. Ask your provider what activities are safe for you. General instructions Do not use any products that contain nicotine or tobacco. These products include cigarettes, chewing tobacco, and vaping devices, such as e-cigarettes. These can delay incision healing after surgery. If you need help quitting, ask your provider. Do not take baths, swim, or use a hot tub until your provider approves. Ask your provider if you may take showers. You may only be allowed to take sponge baths. The incision will keep draining. It is normal to have some clear or slightly bloody drainage. The amount of drainage should go down each day. Keep all follow-up visits. Your provider will need to make sure that your incision is healing well and that there are no problems. Your health care provider may give you more instructions. Make sure you know what you can and cannot do Contact a health care provider if: Your cyst or abscess comes back. You have any signs of infection. You notice red streaks that spread away from the incision site. You have a fever or chills. Get help right away if: You have severe pain or bleeding. You become short of breath. You have chest pain. You have signs of a severe infection. You may notice changes in your incision area, such as: Swelling that makes the skin feel hard. Numbness or tingling. Sudden increase in redness. Your skin color may change from red to purple, and then to dark  spots. Blisters, ulcers, or splitting of the skin. These symptoms may be an emergency. Get help right away. Call 911. Do not wait to see if the symptoms will go away. Do not drive yourself to the hospital. This information is not intended to replace advice given to you by your health care provider. Make sure you discuss any questions you have with your health care provider. Document Revised: 07/13/2022 Document Reviewed: 07/13/2022 Elsevier  Patient Education  2024 [employer].

## 2024-03-22 NOTE — Progress Notes (Signed)
 S/p left leg hematoma, doing well from surgical perspective. Wound measures 3x1 cm beefy granulation tissue and superficial. I replaced w Large bandaid.  A/ P doing well Do daily dressing changes RTC prn

## 2024-03-23 ENCOUNTER — Telehealth: Payer: Self-pay

## 2024-03-23 ENCOUNTER — Encounter: Payer: Self-pay | Admitting: Cardiovascular Disease

## 2024-03-23 ENCOUNTER — Ambulatory Visit (INDEPENDENT_AMBULATORY_CARE_PROVIDER_SITE_OTHER): Payer: Medicare Other | Admitting: Cardiovascular Disease

## 2024-03-23 VITALS — BP 90/60 | HR 76 | Ht 68.0 in | Wt 213.0 lb

## 2024-03-23 DIAGNOSIS — W19XXXA Unspecified fall, initial encounter: Secondary | ICD-10-CM

## 2024-03-23 DIAGNOSIS — I4901 Ventricular fibrillation: Secondary | ICD-10-CM | POA: Diagnosis not present

## 2024-03-23 DIAGNOSIS — I9581 Postprocedural hypotension: Secondary | ICD-10-CM | POA: Diagnosis not present

## 2024-03-23 DIAGNOSIS — I2510 Atherosclerotic heart disease of native coronary artery without angina pectoris: Secondary | ICD-10-CM

## 2024-03-23 DIAGNOSIS — I4811 Longstanding persistent atrial fibrillation: Secondary | ICD-10-CM | POA: Diagnosis not present

## 2024-03-23 DIAGNOSIS — Z9861 Coronary angioplasty status: Secondary | ICD-10-CM

## 2024-03-23 DIAGNOSIS — Z0130 Encounter for examination of blood pressure without abnormal findings: Secondary | ICD-10-CM

## 2024-03-23 DIAGNOSIS — S7012XD Contusion of left thigh, subsequent encounter: Secondary | ICD-10-CM

## 2024-03-23 DIAGNOSIS — Y92009 Unspecified place in unspecified non-institutional (private) residence as the place of occurrence of the external cause: Secondary | ICD-10-CM

## 2024-03-23 MED ORDER — ENALAPRIL MALEATE 5 MG PO TABS
5.0000 mg | ORAL_TABLET | Freq: Every day | ORAL | 11 refills | Status: AC
Start: 2024-03-23 — End: 2025-03-23

## 2024-03-23 NOTE — Telephone Encounter (Signed)
 Alexandria Ida informed and will fax over signed letter stating no change

## 2024-03-23 NOTE — Telephone Encounter (Signed)
 Tara RN @ [facility] called stating that you had wrote a decrease order for the patients enalapril to 5mg  daily but she states that is what he was already taking they are asking for clarification

## 2024-03-23 NOTE — Progress Notes (Signed)
 Cardiology Office Note   Date:  03/23/2024   ID:  Roger [name], DOB [birth year], MRN [id number]  PCP:  Roger Loveless, MD  Cardiologist:  Roger Blackwater, MD      History of Present Illness: Roger [name] is a 78 y.o. male who presents for  Chief Complaint  Patient presents with   Follow-up    3 Months Follow Up    At rehab at [facility]. He fell.      Past Medical History:  Diagnosis Date   Anemia    Arthritis    Atrial fibrillation (HCC)    Bladder stone    Dysrhythmia    GERD (gastroesophageal reflux disease)    Hypertension      Past Surgical History:  Procedure Laterality Date   CATARACT EXTRACTION     CORONARY STENT INTERVENTION N/A 04/20/2019   Procedure: CORONARY STENT INTERVENTION;  Surgeon: Roger Pea, MD;  Location: [facility];  Service: Cardiovascular;  Laterality: N/A;   CYSTOSCOPY WITH LITHOLAPAXY N/A 10/06/2019   Procedure: CYSTOSCOPY WITH LITHOLAPAXY;  Surgeon: Roger Come, MD;  Location: [facility];  Service: Urology;  Laterality: N/A;   HIP SURGERY     HOLEP-LASER ENUCLEATION OF THE PROSTATE WITH MORCELLATION N/A 10/06/2019   Procedure: HOLEP-LASER ENUCLEATION OF THE PROSTATE WITH MORCELLATION;  Surgeon: Roger Come, MD;  Location: [facility];  Service: Urology;  Laterality: N/A;   INCISION AND DRAINAGE OF HEMATOMA, KNEE Left 02/18/2024   Procedure: INCISION AND DRAINAGE OF HEMATOMA, KNEE;  Surgeon: Roger Ro, MD;  Location: [facility];  Service: General;  Laterality: Left;   JOINT REPLACEMENT Right 2012   hip right   LEFT HEART CATH AND CORONARY ANGIOGRAPHY Left 04/20/2019   Procedure: LEFT HEART CATH AND CORONARY ANGIOGRAPHY;  Surgeon: Roger Nancy, MD;  Location: [facility];  Service: Cardiovascular;  Laterality: Left;     Current Outpatient Medications  Medication Sig Dispense Refill   enalapril (VASOTEC) 5 MG tablet Take 1 tablet (5 mg total) by mouth daily. 30 tablet 11   azelastine  (ASTELIN) 0.1 % nasal spray Place 2 sprays into both nostrils 2 (two) times daily. 30 mL 3   Bempedoic Acid-Ezetimibe (NEXLIZET) 180-10 MG TABS TAKE 1 TABLET BY MOUTH EVERY DAY 90 tablet 3   ELIQUIS 2.5 MG TABS tablet Take 1 tablet (2.5 mg total) by mouth 2 (two) times daily. 60 tablet 3   empagliflozin (JARDIANCE) 25 MG TABS tablet Take 1 tablet by mouth daily.     ketoconazole (NIZORAL) 2 % shampoo Apply topically.     metoprolol succinate (TOPROL-XL) 50 MG 24 hr tablet Take 50 mg by mouth daily. Take with or immediately following a meal.     pantoprazole (PROTONIX) 40 MG tablet Take 1 tablet (40 mg total) by mouth daily. 30 tablet 0   REPATHA SURECLICK 140 MG/ML SOAJ ADMINISTER 1 INJECTION UNDER THE SKIN EVERY 2 WEEKS 2 mL 3   spironolactone (ALDACTONE) 25 MG tablet Take 12.5 mg by mouth daily.     Current Facility-Administered Medications  Medication Dose Route Frequency Provider Last Rate Last Admin   cyanocobalamin (VITAMIN B12) injection 1,000 mcg  1,000 mcg Intramuscular Once Roger Loveless, MD        Allergies:   Amoxicillin, Sulfa antibiotics, Sulfur dioxide, Penicillin g, Ibuprofen, Pravastatin, and Rosuvastatin    Social History:   reports that he has quit smoking. His smoking use included cigarettes. He has  been exposed to tobacco smoke. He has never used smokeless tobacco. He reports current alcohol use. He reports that he does not use drugs.   Family History:  family history includes COPD in his father; Dementia in his mother.    ROS:     Review of Systems  Constitutional: Negative.   HENT: Negative.    Eyes: Negative.   Respiratory: Negative.    Gastrointestinal: Negative.   Genitourinary: Negative.   Musculoskeletal: Negative.   Skin: Negative.   Neurological: Negative.   Endo/Heme/Allergies: Negative.   Psychiatric/Behavioral: Negative.    All other systems reviewed and are negative.     All other systems are reviewed and negative.    PHYSICAL EXAM: VS:   BP 90/60   Pulse 76   Ht 5\' 8"  (1.727 m)   Wt 213 lb (96.6 kg)   SpO2 97%   BMI 32.39 kg/m  , BMI Body mass index is 32.39 kg/m. Last weight:  Wt Readings from Last 3 Encounters:  03/23/24 213 lb (96.6 kg)  03/22/24 213 lb 6.4 oz (96.8 kg)  03/08/24 222 lb (100.7 kg)     Physical Exam Vitals reviewed.  Constitutional:      Appearance: Normal appearance. He is normal weight.  HENT:     Head: Normocephalic.     Nose: Nose normal.     Mouth/Throat:     Mouth: Mucous membranes are moist.  Eyes:     Pupils: Pupils are equal, round, and reactive to light.  Cardiovascular:     Rate and Rhythm: Normal rate and regular rhythm.     Pulses: Normal pulses.     Heart sounds: Normal heart sounds.  Pulmonary:     Effort: Pulmonary effort is normal.  Abdominal:     General: Abdomen is flat. Bowel sounds are normal.  Musculoskeletal:        General: Normal range of motion.     Cervical back: Normal range of motion.  Skin:    General: Skin is warm.  Neurological:     General: No focal deficit present.     Mental Status: He is alert.  Psychiatric:        Mood and Affect: Mood normal.       EKG:   Recent Labs: 02/15/2024: B Natriuretic Peptide 126.0 03/02/2024: ALT 12; BUN 26; Creatinine, Ser 1.20; Hemoglobin 11.5; Platelets 353; Potassium 4.1; Sodium 137    Lipid Panel    Component Value Date/Time   CHOL 85 (L) 12/03/2023 1030   TRIG 100 12/03/2023 1030   HDL 50 12/03/2023 1030   LDLCALC 16 12/03/2023 1030      Other studies Reviewed: Additional studies/ records that were reviewed today include:  Review of the above records demonstrates:       No data to display            ASSESSMENT AND PLAN:    ICD-10-CM   1. Ventricular fibrillation (HCC)  I49.01 enalapril (VASOTEC) 5 MG tablet    2. Postprocedural hypotension  I95.81 enalapril (VASOTEC) 5 MG tablet    3. Longstanding persistent atrial fibrillation (HCC)  I48.11 enalapril (VASOTEC) 5 MG tablet     4. CAD S/P percutaneous coronary angioplasty  I25.10 enalapril (VASOTEC) 5 MG tablet   Z98.61    No chest pain    5. Fall at home, initial encounter  W19.XXXA enalapril (VASOTEC) 5 MG tablet   Y92.009     6. Hematoma of left thigh, subsequent encounter  S70.12XD enalapril (  VASOTEC) 5 MG tablet       Problem List Items Addressed This Visit       Cardiovascular and Mediastinum   Ventricular fibrillation (HCC) - Primary   Relevant Medications   enalapril (VASOTEC) 5 MG tablet   Hypotension   Relevant Medications   enalapril (VASOTEC) 5 MG tablet   Longstanding persistent atrial fibrillation (HCC)   Relevant Medications   enalapril (VASOTEC) 5 MG tablet   CAD S/P percutaneous coronary angioplasty   Relevant Medications   enalapril (VASOTEC) 5 MG tablet     Other   Fall at home, initial encounter   Relevant Medications   enalapril (VASOTEC) 5 MG tablet   Hematoma of left thigh   Relevant Medications   enalapril (VASOTEC) 5 MG tablet       Disposition:   Return in about 2 months (around 05/23/2024).    Total time spent: 35 minutes  Signed,  Debborah Fairly, MD  03/23/2024 10:11 AM    [facility]

## 2024-05-25 ENCOUNTER — Ambulatory Visit: Admitting: Cardiovascular Disease
